# Patient Record
Sex: Female | Born: 1990 | Race: White | Hispanic: No | Marital: Single | Smoking: Current every day smoker
Health system: Southern US, Community
[De-identification: ages and names within clinical notes are randomized; demographics above are authoritative.]

## PROBLEM LIST (undated history)

## (undated) ENCOUNTER — Inpatient Hospital Stay (HOSPITAL_COMMUNITY): Payer: Self-pay

## (undated) DIAGNOSIS — R87629 Unspecified abnormal cytological findings in specimens from vagina: Secondary | ICD-10-CM

## (undated) DIAGNOSIS — L409 Psoriasis, unspecified: Secondary | ICD-10-CM

## (undated) DIAGNOSIS — B9689 Other specified bacterial agents as the cause of diseases classified elsewhere: Secondary | ICD-10-CM

## (undated) DIAGNOSIS — I509 Heart failure, unspecified: Secondary | ICD-10-CM

## (undated) DIAGNOSIS — F329 Major depressive disorder, single episode, unspecified: Secondary | ICD-10-CM

## (undated) DIAGNOSIS — A549 Gonococcal infection, unspecified: Secondary | ICD-10-CM

## (undated) DIAGNOSIS — I10 Essential (primary) hypertension: Secondary | ICD-10-CM

## (undated) DIAGNOSIS — F32A Depression, unspecified: Secondary | ICD-10-CM

## (undated) DIAGNOSIS — N390 Urinary tract infection, site not specified: Secondary | ICD-10-CM

## (undated) DIAGNOSIS — N760 Acute vaginitis: Secondary | ICD-10-CM

## (undated) HISTORY — PX: TOOTH EXTRACTION: SUR596

## (undated) HISTORY — PX: NO PAST SURGERIES: SHX2092

## (undated) HISTORY — DX: Heart failure, unspecified: I50.9

## (undated) HISTORY — DX: Depression, unspecified: F32.A

## (undated) HISTORY — DX: Major depressive disorder, single episode, unspecified: F32.9

---
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
## 2010-05-12 ENCOUNTER — Inpatient Hospital Stay (HOSPITAL_COMMUNITY)
Admission: AD | Admit: 2010-05-12 | Discharge: 2010-05-12 | Payer: Self-pay | Source: Home / Self Care | Admitting: Obstetrics and Gynecology

## 2010-05-12 ENCOUNTER — Ambulatory Visit: Payer: Self-pay | Admitting: Obstetrics and Gynecology

## 2010-10-01 ENCOUNTER — Ambulatory Visit (INDEPENDENT_AMBULATORY_CARE_PROVIDER_SITE_OTHER): Payer: Self-pay

## 2010-10-01 ENCOUNTER — Inpatient Hospital Stay (INDEPENDENT_AMBULATORY_CARE_PROVIDER_SITE_OTHER)
Admission: RE | Admit: 2010-10-01 | Discharge: 2010-10-01 | Disposition: A | Discharge status: Home / Self Care | Payer: Self-pay | Source: Ambulatory Visit

## 2010-10-01 DIAGNOSIS — IMO0002 Reserved for concepts with insufficient information to code with codable children: Secondary | ICD-10-CM

## 2010-10-01 DIAGNOSIS — Y33XXXA Other specified events, undetermined intent, initial encounter: Secondary | ICD-10-CM

## 2010-10-02 NOTE — Op Note (Signed)
NAMEJISELLE, Sharp                ACCOUNT NO.:  [id number]  MEDICAL RECORD NO.:  [id number]           PATIENT TYPE:  O  LOCATION:  URG                          FACILITY:  [facility]  PHYSICIAN:  Dionne Ano. Izabelle Daus, M.D.DATE OF BIRTH:  [birth year]  DATE OF PROCEDURE:  10/01/2010 DATE OF DISCHARGE:  10/01/2010                              OPERATIVE REPORT   I had the pleasure to see Lauren Sharp in the [facility] at the request of the emergency room physicians.  This patient is a 20 year old female who complains of pain in the right fifth finger after hitting the wall.  The patient is here with her mother.  She complains of mild- to-moderate pain with location over the right small finger.  She has mild deformity present.  I was asked to see and take over her care.  PAST MEDICAL HISTORY:  Psoriasis.  PAST SURGICAL HISTORY:  None noted.  MEDICATION:  Adderall.  ALLERGIES:  None.  REVIEW OF SYSTEMS:  She does not complain of any significant issues on review systems.  A 14-point review is reviewed.  FAMILY HISTORY:  Noncontributory and reviewed at length.  Her father is deceased.  Her mother is here with her.  SOCIAL HISTORY:  She smokes and drinks.  She is 20 years of age.  She completed ninth grade.  PHYSICAL EXAMINATION:  GENERAL:  She is a pleasant female, alert and oriented in no acute distress. VITAL SIGNS:  Stable. HEENT:  Within normal limits.  She has multiple piercing's throughout the nose, lips and ears.  The patient has normal HEENT examination, otherwise. CHEST:  Clear. ABDOMEN:  Nontender.  She is without chest pain, shortness of breath or abnormalities on lung and abdominal exam.  EXTREMITIES:  Lower extremity examination is benign.  Left upper extremity is neurovascularly intact. Normal alignment and stability and range of motion.  Right upper extremity has pain, swelling, ecchymosis and a mild deformity about the P1 region of her right small finger.   The remaining ring, middle, index and thumb digits are normal.  X-rays show displaced proximal phalanx fracture about the right small finger proximal phalanx.  IMPRESSION:  Closure of proximal phalanx fracture with displacement.  PLAN:  I have consented her for reduction and casting.  PROCEDURE NOTE:  She underwent a manipulative reduction followed by Coban taping and x-rays.  X-rays looked excellent and following the x- rays, I then placed her in a short-arm cast, well molded to my satisfaction.  I performed a casting myself with closed reduction myself and all looked quite well.  I asked her to use pain medicine according to our instructions.  She was given a pain medicine by the emergency room staff.  She will return to see me in a week for a 3-view x-rays and the cast, notify me should any problems occur.  These notes have been discussed.  All questions encouraged and answered.  She left the emergency room awake, alert and oriented without complicating feature.     Dionne Ano. Amanda Pea, M.D.     [facility]  D:  10/01/2010  T:  10/02/2010  Job:  [id number]  Electronically Signed by Dominica Severin M.D. on 10/02/2010 10:10:46 AM

## 2010-10-14 LAB — CBC
MCH: 27.8 pg (ref 26.0–34.0)
MCHC: 33.7 g/dL (ref 30.0–36.0)
Platelets: 255 10*3/uL (ref 150–400)

## 2010-10-14 LAB — GC/CHLAMYDIA PROBE AMP, GENITAL: GC Probe Amp, Genital: NEGATIVE

## 2010-10-14 LAB — WET PREP, GENITAL: Yeast Wet Prep HPF POC: NONE SEEN

## 2012-09-07 ENCOUNTER — Inpatient Hospital Stay (HOSPITAL_COMMUNITY)
Admission: AD | Admit: 2012-09-07 | Discharge: 2012-09-07 | Disposition: A | Discharge status: Home / Self Care | Payer: Self-pay | Source: Ambulatory Visit | Attending: Obstetrics & Gynecology | Admitting: Obstetrics & Gynecology

## 2012-09-07 ENCOUNTER — Encounter (HOSPITAL_COMMUNITY): Payer: Self-pay | Admitting: *Deleted

## 2012-09-07 DIAGNOSIS — N949 Unspecified condition associated with female genital organs and menstrual cycle: Secondary | ICD-10-CM | POA: Insufficient documentation

## 2012-09-07 DIAGNOSIS — N760 Acute vaginitis: Secondary | ICD-10-CM | POA: Insufficient documentation

## 2012-09-07 DIAGNOSIS — B9689 Other specified bacterial agents as the cause of diseases classified elsewhere: Secondary | ICD-10-CM | POA: Insufficient documentation

## 2012-09-07 DIAGNOSIS — A499 Bacterial infection, unspecified: Secondary | ICD-10-CM | POA: Insufficient documentation

## 2012-09-07 LAB — WET PREP, GENITAL
Trich, Wet Prep: NONE SEEN
Yeast Wet Prep HPF POC: NONE SEEN

## 2012-09-07 LAB — URINALYSIS, ROUTINE W REFLEX MICROSCOPIC
Glucose, UA: NEGATIVE mg/dL
Nitrite: NEGATIVE
Protein, ur: NEGATIVE mg/dL
pH: 6 (ref 5.0–8.0)

## 2012-09-07 LAB — URINE MICROSCOPIC-ADD ON

## 2012-09-07 LAB — POCT PREGNANCY, URINE: Preg Test, Ur: NEGATIVE

## 2012-09-07 MED ORDER — METRONIDAZOLE 500 MG PO TABS: 500.0000 mg | ORAL_TABLET | Freq: Three times a day (TID) | ORAL | Status: DC

## 2012-09-07 NOTE — MAU Provider Note (Signed)
CC: Vaginal Discharge    First Provider Initiated Contact with Patient 09/07/12 1504      HPI Lauren Sharp is a 22 y.o. [place]  who presents with onset 1 wk ago of malodorous fishy vaginal discharge, no change in usual appearance or amount. Similar sx when had BV in past. Went to [facility] today and was told she did not have a vaginal infection but she wants another opinion. They did GC/CT today. Denies having new or multiple sexual partners. Has Mirena and does not use condoms. Denies UTI sx.  Amenorrheic on Mirena. Denies pelvic pain or VB.   History reviewed. No pertinent past medical history.  OB History    Grav Para Term Preterm Abortions TAB SAB Ect Mult Living                  History reviewed. No pertinent past surgical history.  History   Social History  . Marital Status: Single    Spouse Name: N/A    Number of Children: N/A  . Years of Education: N/A   Occupational History  . Not on file.   Social History Main Topics  . Smoking status: Current Some Day Smoker  . Smokeless tobacco: Not on file  . Alcohol Use: No  . Drug Use: No  . Sexually Active: Yes    Birth Control/ Protection: IUD   Other Topics Concern  . Not on file   Social History Narrative  . No narrative on file    No current facility-administered medications on file prior to encounter.   No current outpatient prescriptions on file prior to encounter.    Allergies not on file  ROS Pertinent items in HPI  PHYSICAL EXAM Filed Vitals:   09/07/12 1333  BP: 138/81  Pulse: 71  Temp: 98.2 F (36.8 C)  Resp: 18   General: Well nourished, well developed female in no acute distress Cardiovascular: Normal rate Respiratory: Normal effort Abdomen: Soft, nontender Back: No CVAT Extremities: No edema Neurologic: Alert and oriented Speculum exam: NEFG; vagina with thin grey-white discharge, no obvious malodor or blood; cervix clean, Mirena strings in place Bimanual exam: cervix closed, no CMT;  uterus NSSP; no adnexal tenderness or masses  LAB RESULTS Results for orders placed during the hospital encounter of 09/07/12 (from the past 24 hour(s))  URINALYSIS, ROUTINE W REFLEX MICROSCOPIC     Status: Abnormal   Collection Time   09/07/12  1:35 PM      Component Value Range   Color, Urine YELLOW  YELLOW   APPearance CLEAR  CLEAR   Specific Gravity, Urine 1.015  1.005 - 1.030   pH 6.0  5.0 - 8.0   Glucose, UA NEGATIVE  NEGATIVE mg/dL   Hgb urine dipstick NEGATIVE  NEGATIVE   Bilirubin Urine NEGATIVE  NEGATIVE   Ketones, ur NEGATIVE  NEGATIVE mg/dL   Protein, ur NEGATIVE  NEGATIVE mg/dL   Urobilinogen, UA 0.2  0.0 - 1.0 mg/dL   Nitrite NEGATIVE  NEGATIVE   Leukocytes, UA MODERATE (*) NEGATIVE  URINE MICROSCOPIC-ADD ON     Status: Abnormal   Collection Time   09/07/12  1:35 PM      Component Value Range   Squamous Epithelial / LPF FEW (*) RARE   WBC, UA 7-10  <3 WBC/hpf   RBC / HPF 3-6  <3 RBC/hpf   Bacteria, UA FEW (*) RARE  POCT PREGNANCY, URINE     Status: Normal   Collection Time   09/07/12  1:44  PM      Component Value Range   Preg Test, Ur NEGATIVE  NEGATIVE    ASSESSMENT  1. BV (bacterial vaginosis)     PLAN Urine C&S sent per lab protocol, but she is not pregnant so won't need tx for ASB if present. Discharge home. See AVS for patient education.    Medication List    TAKE these medications       metroNIDAZOLE 500 MG tablet  Commonly known as:  FLAGYL  Take 1 tablet (500 mg total) by mouth 3 (three) times daily.          Danae Orleans, CNM 09/07/2012 3:12 PM

## 2012-09-07 NOTE — MAU Note (Signed)
Thinks has bacterial infection or something. Yellow d/c with odor.  Gets bacterial infections often.

## 2012-09-08 LAB — URINE CULTURE: Colony Count: 15000

## 2013-02-22 ENCOUNTER — Encounter (HOSPITAL_COMMUNITY): Payer: Self-pay | Admitting: *Deleted

## 2013-02-22 ENCOUNTER — Inpatient Hospital Stay (HOSPITAL_COMMUNITY)
Admission: AD | Admit: 2013-02-22 | Discharge: 2013-02-22 | Disposition: A | Discharge status: Home / Self Care | Payer: Self-pay | Source: Ambulatory Visit | Attending: Obstetrics & Gynecology | Admitting: Obstetrics & Gynecology

## 2013-02-22 DIAGNOSIS — N898 Other specified noninflammatory disorders of vagina: Secondary | ICD-10-CM

## 2013-02-22 DIAGNOSIS — N938 Other specified abnormal uterine and vaginal bleeding: Secondary | ICD-10-CM | POA: Insufficient documentation

## 2013-02-22 DIAGNOSIS — I10 Essential (primary) hypertension: Secondary | ICD-10-CM | POA: Insufficient documentation

## 2013-02-22 DIAGNOSIS — N949 Unspecified condition associated with female genital organs and menstrual cycle: Secondary | ICD-10-CM | POA: Insufficient documentation

## 2013-02-22 HISTORY — DX: Psoriasis, unspecified: L40.9

## 2013-02-22 HISTORY — DX: Other specified bacterial agents as the cause of diseases classified elsewhere: B96.89

## 2013-02-22 HISTORY — DX: Gonococcal infection, unspecified: A54.9

## 2013-02-22 HISTORY — DX: Acute vaginitis: N76.0

## 2013-02-22 LAB — URINALYSIS, ROUTINE W REFLEX MICROSCOPIC
Bilirubin Urine: NEGATIVE
Glucose, UA: NEGATIVE mg/dL
Ketones, ur: NEGATIVE mg/dL
Protein, ur: NEGATIVE mg/dL
pH: 7 (ref 5.0–8.0)

## 2013-02-22 LAB — URINE MICROSCOPIC-ADD ON

## 2013-02-22 LAB — WET PREP, GENITAL
Clue Cells Wet Prep HPF POC: NONE SEEN
Trich, Wet Prep: NONE SEEN

## 2013-02-22 NOTE — MAU Note (Signed)
[facility] ago and was given treatment for yeast. States she did not think that was what was wrong in the first place. Pill did not help. States she has a clear yellow D/C with slight odor. Called [facility] and was told to come in and they would retest her. Patient states she does not have confidence in their procedures. States she has had the mirena for 2 years and has not had any bleeding until now. Notices blood when she wipes. Not heavy.

## 2013-02-22 NOTE — MAU Provider Note (Signed)
History     CSN: [id number]  Arrival date and time: 02/22/13 1556   First Provider Initiated Contact with Patient 02/22/13 1625      Chief Complaint  Patient presents with  . Vaginal Bleeding  . Vaginal Discharge   HPI This is a 22 y.o. female who presents with c/o clear vaginal discharge with odor and spotting this week. Has had a Mirena IUD for 2 years. Just had STD cultures done at the [facility] and treated for yeast, but does not trust them. Wants to make sure. Some cramping at times, No fever.   RN Note: [facility] 1 [facility] 2 weeks ago and was given treatment for yeast. States she did not think that was what was wrong in the first place. Pill did not help. States she has a clear yellow D/C with slight odor. Called [facility] and was told to come in and they would retest her. Patient states she does not have confidence in their procedures. States she has had the mirena for 2 years and has not had any bleeding until now. Notices blood when she wipes. Not heavy.       OB History   Grav Para Term Preterm Abortions TAB SAB Ect Mult Living   0               Past Medical History  Diagnosis Date  . Gonorrhea   . Bacterial vaginosis     Past Surgical History  Procedure Laterality Date  . Tooth extraction      History reviewed. No pertinent family history.  History  Substance Use Topics  . Smoking status: Current Some Day Smoker -- 1.00 packs/day    Types: Cigarettes  . Smokeless tobacco: Never Used  . Alcohol Use: No     Comment: Rare    Allergies: Allergies not on file  Prescriptions prior to admission  Medication Sig Dispense Refill  . metroNIDAZOLE (FLAGYL) 500 MG tablet Take 1 tablet (500 mg total) by mouth 3 (three) times daily.  14 tablet  0    Review of Systems  Constitutional: Negative for fever, chills and malaise/fatigue.  Gastrointestinal: Positive for abdominal pain (intermittent cramping). Negative for nausea, vomiting, diarrhea and  constipation.  Neurological: Negative for headaches.   Physical Exam   Blood pressure 156/95, pulse 79, temperature 99 F (37.2 C), temperature source Oral, resp. rate 18, height 5\' 4"  (1.626 m), weight 87.998 kg (194 lb).  Physical Exam  Constitutional: She is oriented to person, place, and time. She appears well-developed and well-nourished. No distress.  HENT:  Head: Normocephalic.  Cardiovascular: Normal rate.   Respiratory: Effort normal.  GI: Soft. She exhibits no distension and no mass. There is no tenderness. There is no rebound and no guarding.  Genitourinary: Uterus normal. Vaginal discharge (clear discharge with red/brown streaks, IUD string in place) found.  Musculoskeletal: Normal range of motion.  Neurological: She is alert and oriented to person, place, and time.  Skin: Skin is warm and dry.  Psychiatric: She has a normal mood and affect.    MAU Course  Procedures  MDM Results for orders placed during the hospital encounter of 02/22/13 (from the past 24 hour(s))  URINALYSIS, ROUTINE W REFLEX MICROSCOPIC     Status: Abnormal   Collection Time    02/22/13  4:10 PM      Result Value Range   Color, Urine YELLOW  YELLOW   APPearance CLEAR  CLEAR   Specific Gravity, Urine  1.025  1.005 - 1.030   pH 7.0  5.0 - 8.0   Glucose, UA NEGATIVE  NEGATIVE mg/dL   Hgb urine dipstick LARGE (*) NEGATIVE   Bilirubin Urine NEGATIVE  NEGATIVE   Ketones, ur NEGATIVE  NEGATIVE mg/dL   Protein, ur NEGATIVE  NEGATIVE mg/dL   Urobilinogen, UA 1.0  0.0 - 1.0 mg/dL   Nitrite NEGATIVE  NEGATIVE   Leukocytes, UA TRACE (*) NEGATIVE  URINE MICROSCOPIC-ADD ON     Status: Abnormal   Collection Time    02/22/13  4:10 PM      Result Value Range   Squamous Epithelial / LPF RARE  RARE   WBC, UA 0-2  <3 WBC/hpf   RBC / HPF 0-2  <3 RBC/hpf   Bacteria, UA FEW (*) RARE  POCT PREGNANCY, URINE     Status: None   Collection Time    02/22/13  4:16 PM      Result Value Range   Preg Test, Ur  NEGATIVE  NEGATIVE  WET PREP, GENITAL     Status: Abnormal   Collection Time    02/22/13  4:34 PM      Result Value Range   Yeast Wet Prep HPF POC NONE SEEN  NONE SEEN   Trich, Wet Prep NONE SEEN  NONE SEEN   Clue Cells Wet Prep HPF POC NONE SEEN  NONE SEEN   WBC, Wet Prep HPF POC FEW (*) NONE SEEN     Assessment and Plan  A:  Vaginal discharge with no evidence of infection       Spotting with Mirena        Hypertensive today  P:  Discharge home       Reassured spotting and intermittent discharge is normal        Referred to [facility] for BP evaluation     Penny Arrambide 02/22/2013, 4:25 PM

## 2013-06-16 ENCOUNTER — Encounter (HOSPITAL_COMMUNITY): Payer: Self-pay | Admitting: *Deleted

## 2013-06-16 ENCOUNTER — Inpatient Hospital Stay (HOSPITAL_COMMUNITY)
Admission: AD | Admit: 2013-06-16 | Discharge: 2013-06-16 | Disposition: A | Discharge status: Home / Self Care | Payer: Medicaid Other | Source: Ambulatory Visit | Attending: Obstetrics & Gynecology | Admitting: Obstetrics & Gynecology

## 2013-06-16 DIAGNOSIS — F17200 Nicotine dependence, unspecified, uncomplicated: Secondary | ICD-10-CM | POA: Insufficient documentation

## 2013-06-16 DIAGNOSIS — N949 Unspecified condition associated with female genital organs and menstrual cycle: Secondary | ICD-10-CM | POA: Insufficient documentation

## 2013-06-16 DIAGNOSIS — N760 Acute vaginitis: Secondary | ICD-10-CM

## 2013-06-16 DIAGNOSIS — B9689 Other specified bacterial agents as the cause of diseases classified elsewhere: Secondary | ICD-10-CM | POA: Insufficient documentation

## 2013-06-16 DIAGNOSIS — A499 Bacterial infection, unspecified: Secondary | ICD-10-CM

## 2013-06-16 LAB — URINALYSIS, ROUTINE W REFLEX MICROSCOPIC
Bilirubin Urine: NEGATIVE
Glucose, UA: NEGATIVE mg/dL
Hgb urine dipstick: NEGATIVE
Protein, ur: NEGATIVE mg/dL
Urobilinogen, UA: 1 mg/dL (ref 0.0–1.0)

## 2013-06-16 LAB — WET PREP, GENITAL
Trich, Wet Prep: NONE SEEN
Yeast Wet Prep HPF POC: NONE SEEN

## 2013-06-16 LAB — URINE MICROSCOPIC-ADD ON

## 2013-06-16 MED ORDER — METRONIDAZOLE 500 MG PO TABS: 500.0000 mg | ORAL_TABLET | Freq: Two times a day (BID) | ORAL | Status: DC

## 2013-06-16 NOTE — MAU Provider Note (Signed)
History     CSN: [id number]  Arrival date and time: 06/16/13 1618   First Provider Initiated Contact with Patient 06/16/13 1712      Chief Complaint  Patient presents with  . Vaginal Discharge   HPI Lauren Sharp is a 22 y.o. G0P0. She has Mirena for contraception, last spotting was October. She c/o thin yellow vaginal discharge with strong odor. Has frequent BV, keeps coming back. No UTI S&S or GI changes. Same partner x 1 yr, condoms occ. Neg STD screen last month at [facility].     Past Medical History  Diagnosis Date  . Gonorrhea   . Bacterial vaginosis   . Psoriasis     Past Surgical History  Procedure Laterality Date  . Tooth extraction      History reviewed. No pertinent family history.  History  Substance Use Topics  . Smoking status: Current Some Day Smoker -- 1.00 packs/day    Types: Cigarettes  . Smokeless tobacco: Never Used  . Alcohol Use: No     Comment: Rare    Allergies: No Known Allergies  No prescriptions prior to admission    Review of Systems  Constitutional: Negative for fever and chills.  Gastrointestinal: Negative for nausea, vomiting, abdominal pain, diarrhea and constipation.  Genitourinary: Negative for dysuria, urgency and frequency.   Physical Exam   Blood pressure 137/97, pulse 99, temperature 98.2 F (36.8 C), resp. rate 18, height 5\' 5"  (1.651 m), weight 190 lb (86.183 kg).  Physical Exam  Vitals reviewed. Constitutional: She is oriented to person, place, and time. She appears well-developed and well-nourished.  GI: Soft. There is no tenderness.  Genitourinary:  Pelvic exam: Ext genitalia- nl anatomy,skin intact Vagina- small amt thin white discharge with odor Cx- closed, IUD strings seen Uterus- nl size, non tender Adn- no masses palp, non tender  Neurological: She is alert and oriented to person, place, and time.  Skin: Skin is warm and dry.  Psychiatric: She has a normal mood and affect. Her behavior is normal.    MAU  Course  Procedures  MDM Results for orders placed during the hospital encounter of 06/16/13 (from the past 24 hour(s))  URINALYSIS, ROUTINE W REFLEX MICROSCOPIC     Status: Abnormal   Collection Time    06/16/13  4:55 PM      Result Value Range   Color, Urine YELLOW  YELLOW   APPearance CLEAR  CLEAR   Specific Gravity, Urine 1.025  1.005 - 1.030   pH 7.0  5.0 - 8.0   Glucose, UA NEGATIVE  NEGATIVE mg/dL   Hgb urine dipstick NEGATIVE  NEGATIVE   Bilirubin Urine NEGATIVE  NEGATIVE   Ketones, ur NEGATIVE  NEGATIVE mg/dL   Protein, ur NEGATIVE  NEGATIVE mg/dL   Urobilinogen, UA 1.0  0.0 - 1.0 mg/dL   Nitrite NEGATIVE  NEGATIVE   Leukocytes, UA SMALL (*) NEGATIVE  URINE MICROSCOPIC-ADD ON     Status: Abnormal   Collection Time    06/16/13  4:55 PM      Result Value Range   Squamous Epithelial / LPF FEW (*) RARE   WBC, UA 0-2  <3 WBC/hpf   Bacteria, UA RARE  RARE   Urine-Other MUCOUS PRESENT    WET PREP, GENITAL     Status: Abnormal   Collection Time    06/16/13  5:20 PM      Result Value Range   Yeast Wet Prep HPF POC NONE SEEN  NONE SEEN   Trich,  Wet Prep NONE SEEN  NONE SEEN   Clue Cells Wet Prep HPF POC MODERATE (*) NONE SEEN   WBC, Wet Prep HPF POC MODERATE (*) NONE SEEN     Assessment and Plan  ASSESSMENT:  Bacterial vaginosis,persistent  PLAN:  Flagyl BID x 14 d Other management options reviewed F/u [facility] as needed  Delores Thelen M. 06/16/2013, 5:34 PM

## 2013-06-16 NOTE — MAU Note (Signed)
Pt presents with complaints of a yellowish vaginal discharge with an odor that started about 2 to 3 days ago. She says she has a history of BV and has been treated multiple times

## 2013-06-17 LAB — GC/CHLAMYDIA PROBE AMP
CT Probe RNA: NEGATIVE
GC Probe RNA: NEGATIVE

## 2013-12-09 ENCOUNTER — Inpatient Hospital Stay (HOSPITAL_COMMUNITY)
Admission: AD | Admit: 2013-12-09 | Discharge: 2013-12-09 | Disposition: A | Discharge status: Home / Self Care | Payer: Self-pay | Source: Ambulatory Visit | Attending: Family Medicine | Admitting: Family Medicine

## 2013-12-09 ENCOUNTER — Encounter (HOSPITAL_COMMUNITY): Payer: Self-pay | Admitting: *Deleted

## 2013-12-09 DIAGNOSIS — F17200 Nicotine dependence, unspecified, uncomplicated: Secondary | ICD-10-CM | POA: Insufficient documentation

## 2013-12-09 DIAGNOSIS — IMO0001 Reserved for inherently not codable concepts without codable children: Secondary | ICD-10-CM

## 2013-12-09 DIAGNOSIS — R109 Unspecified abdominal pain: Secondary | ICD-10-CM | POA: Insufficient documentation

## 2013-12-09 DIAGNOSIS — R030 Elevated blood-pressure reading, without diagnosis of hypertension: Secondary | ICD-10-CM | POA: Insufficient documentation

## 2013-12-09 DIAGNOSIS — N390 Urinary tract infection, site not specified: Secondary | ICD-10-CM | POA: Insufficient documentation

## 2013-12-09 HISTORY — DX: Essential (primary) hypertension: I10

## 2013-12-09 HISTORY — DX: Unspecified abnormal cytological findings in specimens from vagina: R87.629

## 2013-12-09 LAB — URINALYSIS, ROUTINE W REFLEX MICROSCOPIC
BILIRUBIN URINE: NEGATIVE
Glucose, UA: NEGATIVE mg/dL
HGB URINE DIPSTICK: NEGATIVE
KETONES UR: NEGATIVE mg/dL
NITRITE: NEGATIVE
PH: 6 (ref 5.0–8.0)
Protein, ur: NEGATIVE mg/dL
Specific Gravity, Urine: >1.03 — ABNORMAL HIGH (ref 1.005–1.030)
Urobilinogen, UA: 0.2 mg/dL (ref 0.0–1.0)

## 2013-12-09 LAB — URINE MICROSCOPIC-ADD ON

## 2013-12-09 LAB — WET PREP, GENITAL
CLUE CELLS WET PREP: NONE SEEN
TRICH WET PREP: NONE SEEN
YEAST WET PREP: NONE SEEN

## 2013-12-09 LAB — POCT PREGNANCY, URINE: PREG TEST UR: NEGATIVE

## 2013-12-09 MED ORDER — CIPROFLOXACIN HCL 500 MG PO TABS: 500.0000 mg | ORAL_TABLET | Freq: Two times a day (BID) | ORAL | Status: DC

## 2013-12-09 NOTE — Discharge Instructions (Signed)
Urinary Tract Infection Urinary tract infections (UTIs) can develop anywhere along your urinary tract. Your urinary tract is your body's drainage system for removing wastes and extra water. Your urinary tract includes two kidneys, two ureters, a bladder, and a urethra. Your kidneys are a pair of bean-shaped organs. Each kidney is about the size of your fist. They are located below your ribs, one on each side of your spine. CAUSES Infections are caused by microbes, which are microscopic organisms, including fungi, viruses, and bacteria. These organisms are so small that they can only be seen through a microscope. Bacteria are the microbes that most commonly cause UTIs. SYMPTOMS  Symptoms of UTIs may vary by age and gender of the patient and by the location of the infection. Symptoms in young women typically include a frequent and intense urge to urinate and a painful, burning feeling in the bladder or urethra during urination. Older women and men are more likely to be tired, shaky, and weak and have muscle aches and abdominal pain. A fever may mean the infection is in your kidneys. Other symptoms of a kidney infection include pain in your back or sides below the ribs, nausea, and vomiting. DIAGNOSIS To diagnose a UTI, your caregiver will ask you about your symptoms. Your caregiver also will ask to provide a urine sample. The urine sample will be tested for bacteria and white blood cells. White blood cells are made by your body to help fight infection. TREATMENT  Typically, UTIs can be treated with medication. Because most UTIs are caused by a bacterial infection, they usually can be treated with the use of antibiotics. The choice of antibiotic and length of treatment depend on your symptoms and the type of bacteria causing your infection. HOME CARE INSTRUCTIONS  If you were prescribed antibiotics, take them exactly as your caregiver instructs you. Finish the medication even if you feel better after you  have only taken some of the medication.  Drink enough water and fluids to keep your urine clear or pale yellow.  Avoid caffeine, tea, and carbonated beverages. They tend to irritate your bladder.  Empty your bladder often. Avoid holding urine for long periods of time.  Empty your bladder before and after sexual intercourse.  After a bowel movement, women should cleanse from front to back. Use each tissue only once. SEEK MEDICAL CARE IF:   You have back pain.  You develop a fever.  Your symptoms do not begin to resolve within 3 days. SEEK IMMEDIATE MEDICAL CARE IF:   You have severe back pain or lower abdominal pain.  You develop chills.  You have nausea or vomiting.  You have continued burning or discomfort with urination. MAKE SURE YOU:   Understand these instructions.  Will watch your condition.  Will get help right away if you are not doing well or get worse. Document Released: 04/27/2005 Document Revised: 01/17/2012 Document Reviewed: 08/26/2011 [facility] Patient Information 2014 [place], [place]. Contraception Choices Contraception (birth control) is the use of any methods or devices to prevent pregnancy. Below are some methods to help avoid pregnancy. HORMONAL METHODS   Contraceptive implant This is a thin, plastic tube containing progesterone hormone. It does not contain estrogen hormone. Your health care provider inserts the tube in the inner part of the upper arm. The tube can remain in place for up to 3 years. After 3 years, the implant must be removed. The implant prevents the ovaries from releasing an egg (ovulation), thickens the cervical mucus to prevent sperm  from entering the uterus, and thins the lining of the inside of the uterus.  Progesterone-only injections These injections are given every 3 months by your health care provider to prevent pregnancy. This synthetic progesterone hormone stops the ovaries from releasing eggs. It also thickens cervical mucus  and changes the uterine lining. This makes it harder for sperm to survive in the uterus.  Birth control pills These pills contain estrogen and progesterone hormone. They work by preventing the ovaries from releasing eggs (ovulation). They also cause the cervical mucus to thicken, preventing the sperm from entering the uterus. Birth control pills are prescribed by a health care provider.Birth control pills can also be used to treat heavy periods.  Minipill This type of birth control pill contains only the progesterone hormone. They are taken every day of each month and must be prescribed by your health care provider.  Birth control patch The patch contains hormones similar to those in birth control pills. It must be changed once a week and is prescribed by a health care provider.  Vaginal ring The ring contains hormones similar to those in birth control pills. It is left in the vagina for 3 weeks, removed for 1 week, and then a new one is put back in place. The patient must be comfortable inserting and removing the ring from the vagina.A health care provider's prescription is necessary.  Emergency contraception Emergency contraceptives prevent pregnancy after unprotected sexual intercourse. This pill can be taken right after sex or up to 5 days after unprotected sex. It is most effective the sooner you take the pills after having sexual intercourse. Most emergency contraceptive pills are available without a prescription. Check with your pharmacist. Do not use emergency contraception as your only form of birth control. BARRIER METHODS   Female condom This is a thin sheath (latex or rubber) that is worn over the penis during sexual intercourse. It can be used with spermicide to increase effectiveness.  Female condom. This is a soft, loose-fitting sheath that is put into the vagina before sexual intercourse.  Diaphragm This is a soft, latex, dome-shaped barrier that must be fitted by a health care  provider. It is inserted into the vagina, along with a spermicidal jelly. It is inserted before intercourse. The diaphragm should be left in the vagina for 6 to 8 hours after intercourse.  Cervical cap This is a round, soft, latex or plastic cup that fits over the cervix and must be fitted by a health care provider. The cap can be left in place for up to 48 hours after intercourse.  Sponge This is a soft, circular piece of polyurethane foam. The sponge has spermicide in it. It is inserted into the vagina after wetting it and before sexual intercourse.  Spermicides These are chemicals that kill or block sperm from entering the cervix and uterus. They come in the form of creams, jellies, suppositories, foam, or tablets. They do not require a prescription. They are inserted into the vagina with an applicator before having sexual intercourse. The process must be repeated every time you have sexual intercourse. INTRAUTERINE CONTRACEPTION  Intrauterine device (IUD) This is a T-shaped device that is put in a woman's uterus during a menstrual period to prevent pregnancy. There are 2 types:  Copper IUD This type of IUD is wrapped in copper wire and is placed inside the uterus. Copper makes the uterus and fallopian tubes produce a fluid that kills sperm. It can stay in place for 10 years.  Hormone  IUD This type of IUD contains the hormone progestin (synthetic progesterone). The hormone thickens the cervical mucus and prevents sperm from entering the uterus, and it also thins the uterine lining to prevent implantation of a fertilized egg. The hormone can weaken or kill the sperm that get into the uterus. It can stay in place for 3 5 years, depending on which type of IUD is used. PERMANENT METHODS OF CONTRACEPTION  Female tubal ligation This is when the woman's fallopian tubes are surgically sealed, tied, or blocked to prevent the egg from traveling to the uterus.  Hysteroscopic sterilization This involves  placing a small coil or insert into each fallopian tube. Your doctor uses a technique called hysteroscopy to do the procedure. The device causes scar tissue to form. This results in permanent blockage of the fallopian tubes, so the sperm cannot fertilize the egg. It takes about 3 months after the procedure for the tubes to become blocked. You must use another form of birth control for these 3 months.  Female sterilization This is when the female has the tubes that carry sperm tied off (vasectomy).This blocks sperm from entering the vagina during sexual intercourse. After the procedure, the man can still ejaculate fluid (semen). NATURAL PLANNING METHODS  Natural family planning This is not having sexual intercourse or using a barrier method (condom, diaphragm, cervical cap) on days the woman could become pregnant.  Calendar method This is keeping track of the length of each menstrual cycle and identifying when you are fertile.  Ovulation method This is avoiding sexual intercourse during ovulation.  Symptothermal method This is avoiding sexual intercourse during ovulation, using a thermometer and ovulation symptoms.  Post ovulation method This is timing sexual intercourse after you have ovulated. Regardless of which type or method of contraception you choose, it is important that you use condoms to protect against the transmission of sexually transmitted infections (STIs). Talk with your health care provider about which form of contraception is most appropriate for you. Document Released: 07/18/2005 Document Revised: 03/20/2013 Document Reviewed: 01/10/2013 [facility] Patient Information 2014 [place], [place].

## 2013-12-09 NOTE — MAU Provider Note (Signed)
CC: Vaginal Discharge, Abdominal Pain and Possible Pregnancy    First Provider Initiated Contact with Patient 12/09/13 1740      HPI Lauren Sharp is a 23 y.o. nulligravida who presents with onset of malodorous yellowish vaginal discharge five days ago. Has associated dysparunia. Has suprapubic pressure and urinary urgency/frequency but denies dysuria. Marland Kitchen.LMP 11/09/2013. Took plan B pills 2 wks ago and had bleeding for a few days. Then seen at [facility] for vaginal discharge similar to what she now describes. and treated for yeast.   ROS Pertinent items in HPI  Past Medical History  Diagnosis Date  . Gonorrhea   . Bacterial vaginosis   . Psoriasis   . Vaginal Pap smear, abnormal   . Hypertension   States she's been told here and at HD that BP elevated, never dx or tx as HTN  OB History  Gravida Para Term Preterm AB SAB TAB Ectopic Multiple Living  0                 Past Surgical History  Procedure Laterality Date  . Tooth extraction      History   Social History  . Marital Status: Single    Spouse Name: N/A    Number of Children: N/A  . Years of Education: N/A   Occupational History  . Not on file.   Social History Main Topics  . Smoking status: Current Some Day Smoker -- 1.00 packs/day for 10 years    Types: Cigarettes  . Smokeless tobacco: Never Used  . Alcohol Use: No     Comment: Rare  . Drug Use: No  . Sexual Activity: Yes    Birth Control/ Protection: None     Comment: last sex three days ago   Other Topics Concern  . Not on file   Social History Narrative  . No narrative on file    No current facility-administered medications on file prior to encounter.   Current Outpatient Prescriptions on File Prior to Encounter  Medication Sig Dispense Refill  . metroNIDAZOLE (FLAGYL) 500 MG tablet Take 1 tablet (500 mg total) by mouth 2 (two) times daily.  28 tablet  0    No Known Allergies   PHYSICAL EXAM Filed Vitals:   12/09/13 1630  BP: 151/94  Pulse: 77   Temp: 98.4 F (36.9 C)  Resp: 16   General: Well nourished, well developed female in no acute distress Cardiovascular: Normal rate Respiratory: Normal effort Abdomen: Soft, mildly tender over bladdertender Back: No CVAT Extremities: No edema Neurologic: Alert and oriented Speculum exam: NEFG; vagina with physiologic discharge, no blood; cervix clean Bimanual exam: cervix closed, no CMT; uterus NSSP; no adnexal tenderness or masses   LAB RESULTS Results for orders placed during the hospital encounter of 12/09/13 (from the past 24 hour(s))  URINALYSIS, ROUTINE W REFLEX MICROSCOPIC     Status: Abnormal   Collection Time    12/09/13  4:35 PM      Result Value Ref Range   Color, Urine YELLOW  YELLOW   APPearance HAZY (*) CLEAR   Specific Gravity, Urine >1.030 (*) 1.005 - 1.030   pH 6.0  5.0 - 8.0   Glucose, UA NEGATIVE  NEGATIVE mg/dL   Hgb urine dipstick NEGATIVE  NEGATIVE   Bilirubin Urine NEGATIVE  NEGATIVE   Ketones, ur NEGATIVE  NEGATIVE mg/dL   Protein, ur NEGATIVE  NEGATIVE mg/dL   Urobilinogen, UA 0.2  0.0 - 1.0 mg/dL   Nitrite NEGATIVE  NEGATIVE  Leukocytes, UA MODERATE (*) NEGATIVE  URINE MICROSCOPIC-ADD ON     Status: Abnormal   Collection Time    12/09/13  4:35 PM      Result Value Ref Range   Squamous Epithelial / LPF MANY (*) RARE   WBC, UA 11-20  <3 WBC/hpf   Bacteria, UA FEW (*) RARE  POCT PREGNANCY, URINE     Status: None   Collection Time    12/09/13  4:42 PM      Result Value Ref Range   Preg Test, Ur NEGATIVE  NEGATIVE  WET PREP, GENITAL     Status: Abnormal   Collection Time    12/09/13  5:57 PM      Result Value Ref Range   Yeast Wet Prep HPF POC NONE SEEN  NONE SEEN   Trich, Wet Prep NONE SEEN  NONE SEEN   Clue Cells Wet Prep HPF POC NONE SEEN  NONE SEEN   WBC, Wet Prep HPF POC MODERATE (*) NONE SEEN    IMAGING No results found.  MAU COURSE GC, CT sent  ASSESSMENT  1. Elevated blood pressure   2. UTI (lower urinary tract infection)      PLAN Discharge home. See AVS for patient education. See PCP for BP evaluation Contraceptive choices information   Medication List         ciprofloxacin 500 MG tablet  Commonly known as:  CIPRO  Take 1 tablet (500 mg total) by mouth 2 (two) times daily.     ibuprofen 200 MG tablet  Commonly known as:  ADVIL,MOTRIN  Take 400 mg by mouth every 6 (six) hours as needed for cramping.         folliculitis  Danae OrleansDeirdre C Aero Drummonds, CNM 12/09/2013 5:42 PM

## 2013-12-09 NOTE — MAU Note (Signed)
Patient states she has recurrent yeast infections. States she has been having a vaginal discharge with an odor and has pain with intercourse. Denies bleeding, nausea or vomiting.

## 2013-12-09 NOTE — MAU Provider Note (Signed)
Attestation of Attending Supervision of Advanced Practitioner (PA/CNM/NP): Evaluation and management procedures were performed by the Advanced Practitioner under my supervision and collaboration.  I have reviewed the Advanced Practitioner's note and chart, and I agree with the management and plan.  Reva Boresanya S Makiah Foye, MD [facility] Attending 12/09/2013 6:19 PM

## 2013-12-10 LAB — GC/CHLAMYDIA PROBE AMP
CT Probe RNA: NEGATIVE
GC PROBE AMP APTIMA: NEGATIVE

## 2014-02-08 ENCOUNTER — Encounter (HOSPITAL_COMMUNITY): Payer: Self-pay | Admitting: General Practice

## 2014-02-08 ENCOUNTER — Inpatient Hospital Stay (HOSPITAL_COMMUNITY)
Admission: AD | Admit: 2014-02-08 | Discharge: 2014-02-08 | Disposition: A | Discharge status: Home / Self Care | Payer: Medicaid Other | Source: Ambulatory Visit | Attending: Obstetrics and Gynecology | Admitting: Obstetrics and Gynecology

## 2014-02-08 DIAGNOSIS — N946 Dysmenorrhea, unspecified: Secondary | ICD-10-CM | POA: Insufficient documentation

## 2014-02-08 DIAGNOSIS — L408 Other psoriasis: Secondary | ICD-10-CM | POA: Insufficient documentation

## 2014-02-08 DIAGNOSIS — I10 Essential (primary) hypertension: Secondary | ICD-10-CM | POA: Insufficient documentation

## 2014-02-08 DIAGNOSIS — F17200 Nicotine dependence, unspecified, uncomplicated: Secondary | ICD-10-CM | POA: Insufficient documentation

## 2014-02-08 DIAGNOSIS — R109 Unspecified abdominal pain: Secondary | ICD-10-CM | POA: Insufficient documentation

## 2014-02-08 LAB — URINALYSIS, ROUTINE W REFLEX MICROSCOPIC
BILIRUBIN URINE: NEGATIVE
GLUCOSE, UA: NEGATIVE mg/dL
KETONES UR: NEGATIVE mg/dL
Nitrite: POSITIVE — AB
Protein, ur: 100 mg/dL — AB
SPECIFIC GRAVITY, URINE: 1.015 (ref 1.005–1.030)
Urobilinogen, UA: 1 mg/dL (ref 0.0–1.0)
pH: 7 (ref 5.0–8.0)

## 2014-02-08 LAB — CBC
HEMATOCRIT: 44.5 % (ref 36.0–46.0)
Hemoglobin: 15.9 g/dL — ABNORMAL HIGH (ref 12.0–15.0)
MCH: 30.7 pg (ref 26.0–34.0)
MCHC: 35.7 g/dL (ref 30.0–36.0)
MCV: 85.9 fL (ref 78.0–100.0)
Platelets: 229 10*3/uL (ref 150–400)
RBC: 5.18 MIL/uL — AB (ref 3.87–5.11)
RDW: 12.7 % (ref 11.5–15.5)
WBC: 10.5 10*3/uL (ref 4.0–10.5)

## 2014-02-08 LAB — URINE MICROSCOPIC-ADD ON

## 2014-02-08 LAB — POCT PREGNANCY, URINE: Preg Test, Ur: NEGATIVE

## 2014-02-08 MED ORDER — KETOROLAC TROMETHAMINE 60 MG/2ML IM SOLN
60.0000 mg | Freq: Once | INTRAMUSCULAR | Status: AC
  Administered 2014-02-08: 60 mg via INTRAMUSCULAR
  Filled 2014-02-08: qty 2

## 2014-02-08 NOTE — MAU Provider Note (Signed)
History     CSN: [id number]  Arrival date and time: 02/08/14 1840   First Provider Initiated Contact with Patient 02/08/14 1924         Chief Complaint  Patient presents with  . Vaginal Bleeding   HPI  Lauren Sharp is a 23 y.o. female who presents for heavy vaginal bleeding. Period started yesterday; heavier than normal. Uses overnight pads & changes them hourly; not saturated but states blood does cover most of pad when she changes it. Lower abdominal pain started today; sharp & constant. Rates as 7/10; took midol with no relief. Lying flat aggravates pain.  Had Mirena placed 3 years ago for heavy periods; mirena removed 5-6 months ago d/t frequent vaginal infections. Currently no birth control. States has had periods monthly since removal. Periods heavy & last 7 days; states this period was different b/c it started "heavier" than normal.  Denies n/v/d/constipation. Denies urinary symptoms. Denies vaginal discharge. Denies dyspareunia. Has 1 female partner, has been with him for 2 years.   Past Medical History  Diagnosis Date  . Gonorrhea   . Bacterial vaginosis   . Psoriasis   . Vaginal Pap smear, abnormal   . Hypertension     Past Surgical History  Procedure Laterality Date  . Tooth extraction      History reviewed. No pertinent family history.  History  Substance Use Topics  . Smoking status: Current Some Day Smoker -- 1.00 packs/day for 10 years    Types: Cigarettes  . Smokeless tobacco: Never Used  . Alcohol Use: No     Comment: Rare    Allergies: No Known Allergies  Prescriptions prior to admission  Medication Sig Dispense Refill  . Acetaminophen-Caff-Pyrilamine (MIDOL COMPLETE) 500-60-15 MG TABS Take 2 tablets by mouth daily as needed (cramps).      Marland Kitchen. ibuprofen (ADVIL,MOTRIN) 200 MG tablet Take 400 mg by mouth every 6 (six) hours as needed for cramping.        Review of Systems  Respiratory: Negative.   Cardiovascular: Negative.   Gastrointestinal:  Positive for abdominal pain. Negative for nausea, vomiting, diarrhea and constipation.  Genitourinary: Negative for dysuria, urgency and frequency.       Positive for vaginal bleeding.   Neurological: Positive for weakness (has felt "drained" for several months). Negative for dizziness and tingling.  Endo/Heme/Allergies: Does not bruise/bleed easily.   Physical Exam   Blood pressure 134/88, pulse 100, temperature 98 F (36.7 C), temperature source Oral, resp. rate 18, height 5\' 4"  (1.626 m), weight 184 lb 6 oz (83.632 kg), last menstrual period 02/07/2014, SpO2 100.00%.  Orthostatic Blood Pressure: Blood pressure:   lying 140/77, sitting 138/89, standing 134/88 Pulse:   lying 84, sitting 80, standing 100    Physical Exam  Constitutional: She is oriented to person, place, and time. She appears well-developed and well-nourished.  Cardiovascular: Normal rate, regular rhythm and normal heart sounds.   Respiratory: Effort normal and breath sounds normal. No respiratory distress.  GI: Soft. Bowel sounds are normal. She exhibits no distension and no mass. There is tenderness (lower abdomen). There is no rebound and no guarding.  Neurological: She is alert and oriented to person, place, and time.  Skin: Skin is warm and dry. She is not diaphoretic.    MAU Course  Procedures Results for orders placed during the hospital encounter of 02/08/14 (from the past 24 hour(s))  URINALYSIS, ROUTINE W REFLEX MICROSCOPIC     Status: Abnormal   Collection Time  02/08/14  6:56 PM      Result Value Ref Range   Color, Urine ORANGE (*) YELLOW   APPearance CLOUDY (*) CLEAR   Specific Gravity, Urine 1.015  1.005 - 1.030   pH 7.0  5.0 - 8.0   Glucose, UA NEGATIVE  NEGATIVE mg/dL   Hgb urine dipstick LARGE (*) NEGATIVE   Bilirubin Urine NEGATIVE  NEGATIVE   Ketones, ur NEGATIVE  NEGATIVE mg/dL   Protein, ur 161 (*) NEGATIVE mg/dL   Urobilinogen, UA 1.0  0.0 - 1.0 mg/dL   Nitrite POSITIVE (*)  NEGATIVE   Leukocytes, UA TRACE (*) NEGATIVE  URINE MICROSCOPIC-ADD ON     Status: Abnormal   Collection Time    02/08/14  6:56 PM      Result Value Ref Range   Squamous Epithelial / LPF FEW (*) RARE   WBC, UA 3-6  <3 WBC/hpf   RBC / HPF TOO NUMEROUS TO COUNT  <3 RBC/hpf   Bacteria, UA FEW (*) RARE   Urine-Other MUCOUS PRESENT    CBC     Status: Abnormal   Collection Time    02/08/14  7:20 PM      Result Value Ref Range   WBC 10.5  4.0 - 10.5 K/uL   RBC 5.18 (*) 3.87 - 5.11 MIL/uL   Hemoglobin 15.9 (*) 12.0 - 15.0 g/dL   HCT 09.6  04.5 - 40.9 %   MCV 85.9  78.0 - 100.0 fL   MCH 30.7  26.0 - 34.0 pg   MCHC 35.7  30.0 - 36.0 g/dL   RDW 81.1  91.4 - 78.2 %   Platelets 229  150 - 400 K/uL  POCT PREGNANCY, URINE     Status: None   Collection Time    02/08/14  7:30 PM      Result Value Ref Range   Preg Test, Ur NEGATIVE  NEGATIVE    MDM Pt given toradol. Client does not feel like she has a UTI today.  Has had a UTI in the past.  Likely her urine is contaminated with heavy menses today.  Assessment and Plan  A:Dysmenorrhea and heavy bleeding  P: Ibuprofen 600 mg PO with food every 6 hours for 2 days and then PRN to control her pain. There is no anemia. [facility]  Currie Paris, NP  02/08/2014, 8:20 PM   Care assumed by Nolene Bernheim FNP at 8:20 PM I supervised the student and agree with the exam and documentation.

## 2014-02-08 NOTE — Discharge Instructions (Signed)
Change your medicine to control the cramps.   Take Ibuprofen 600 mg PO with food every 6 hours for 2 days and then PRN to control pain. There is no anemia. [facility].

## 2014-02-08 NOTE — MAU Note (Signed)
Pt states here for heavy vaginal bleeding that began yesterday. Changing pad/tampon at least every hour since cycle came on. Starting to feel weak, low energy. Hx anemia. Lower abd cramping down into legs that has been constant today.

## 2014-02-10 NOTE — MAU Provider Note (Signed)
Attestation of Attending Supervision of Advanced Practitioner: Evaluation and management procedures were performed by the PA/NP/CNM/OB Fellow under my supervision/collaboration. Chart reviewed and agree with management and plan.  Mekaila Tarnow V 02/10/2014 12:07 AM

## 2014-04-07 ENCOUNTER — Inpatient Hospital Stay (HOSPITAL_COMMUNITY): Payer: Medicaid Other

## 2014-04-07 ENCOUNTER — Inpatient Hospital Stay (HOSPITAL_COMMUNITY)
Admission: AD | Admit: 2014-04-07 | Discharge: 2014-04-07 | Disposition: A | Discharge status: Home / Self Care | Payer: Medicaid Other | Source: Ambulatory Visit | Attending: Family Medicine | Admitting: Family Medicine

## 2014-04-07 ENCOUNTER — Encounter (HOSPITAL_COMMUNITY): Payer: Self-pay

## 2014-04-07 DIAGNOSIS — R109 Unspecified abdominal pain: Secondary | ICD-10-CM | POA: Diagnosis present

## 2014-04-07 DIAGNOSIS — O10019 Pre-existing essential hypertension complicating pregnancy, unspecified trimester: Secondary | ICD-10-CM | POA: Insufficient documentation

## 2014-04-07 DIAGNOSIS — O99330 Smoking (tobacco) complicating pregnancy, unspecified trimester: Secondary | ICD-10-CM | POA: Diagnosis not present

## 2014-04-07 DIAGNOSIS — O99891 Other specified diseases and conditions complicating pregnancy: Secondary | ICD-10-CM | POA: Insufficient documentation

## 2014-04-07 DIAGNOSIS — O9989 Other specified diseases and conditions complicating pregnancy, childbirth and the puerperium: Secondary | ICD-10-CM

## 2014-04-07 DIAGNOSIS — O26899 Other specified pregnancy related conditions, unspecified trimester: Secondary | ICD-10-CM

## 2014-04-07 LAB — URINALYSIS, ROUTINE W REFLEX MICROSCOPIC
Bilirubin Urine: NEGATIVE
Glucose, UA: NEGATIVE mg/dL
Hgb urine dipstick: NEGATIVE
Ketones, ur: NEGATIVE mg/dL
Nitrite: NEGATIVE
PROTEIN: NEGATIVE mg/dL
SPECIFIC GRAVITY, URINE: 1.025 (ref 1.005–1.030)
UROBILINOGEN UA: 0.2 mg/dL (ref 0.0–1.0)
pH: 6 (ref 5.0–8.0)

## 2014-04-07 LAB — CBC
HCT: 40.2 % (ref 36.0–46.0)
Hemoglobin: 14.4 g/dL (ref 12.0–15.0)
MCH: 30.6 pg (ref 26.0–34.0)
MCHC: 35.8 g/dL (ref 30.0–36.0)
MCV: 85.4 fL (ref 78.0–100.0)
Platelets: 210 10*3/uL (ref 150–400)
RBC: 4.71 MIL/uL (ref 3.87–5.11)
RDW: 12.6 % (ref 11.5–15.5)
WBC: 10.6 10*3/uL — ABNORMAL HIGH (ref 4.0–10.5)

## 2014-04-07 LAB — POCT PREGNANCY, URINE: Preg Test, Ur: POSITIVE — AB

## 2014-04-07 LAB — HCG, QUANTITATIVE, PREGNANCY: HCG, BETA CHAIN, QUANT, S: 3673 m[IU]/mL — AB (ref <5)

## 2014-04-07 LAB — WET PREP, GENITAL
Clue Cells Wet Prep HPF POC: NONE SEEN
Trich, Wet Prep: NONE SEEN
Yeast Wet Prep HPF POC: NONE SEEN

## 2014-04-07 LAB — URINE MICROSCOPIC-ADD ON

## 2014-04-07 NOTE — MAU Provider Note (Signed)
History     CSN: [id number]  Arrival date and time: 04/07/14 1339   None     Chief Complaint  Patient presents with  . Possible Pregnancy  . Abdominal Cramping   HPI This is a 23 y.o. female at [redacted]w[redacted]d who presents with c/o abdominal cramping. Denies bleeding. This is her first pregnancy. Does not have an OB doctor. [facility]. Had Mirena removed in early 2015. Used Plan B in May.  States they are trying to get pregnant now.   RN Note: Patient states she has had a positive home pregnancy test. States she drank a lot of alcohol over the weekend and is concerned. Has some lower abdominal cramping. Denies bleeding or discharge, nausea or vomiting.        OB History   Grav Para Term Preterm Abortions TAB SAB Ect Mult Living   1         0      Past Medical History  Diagnosis Date  . Gonorrhea   . Bacterial vaginosis   . Psoriasis   . Vaginal Pap smear, abnormal   . Hypertension     Past Surgical History  Procedure Laterality Date  . Tooth extraction      History reviewed. No pertinent family history.  History  Substance Use Topics  . Smoking status: Current Some Day Smoker -- 1.00 packs/day for 10 years    Types: Cigarettes  . Smokeless tobacco: Never Used  . Alcohol Use: Yes     Comment: Rare    Allergies: No Known Allergies  No prescriptions prior to admission    Review of Systems  Constitutional: Negative for fever, chills and malaise/fatigue.  Gastrointestinal: Positive for abdominal pain. Negative for nausea and vomiting.  Genitourinary: Negative for dysuria.  Neurological: Negative for dizziness.   Physical Exam   Blood pressure 133/74, pulse 86, temperature 98.9 F (37.2 C), temperature source Oral, resp. rate 16, height  (1.626 m), weight 83.915 kg (185 lb), last menstrual period 03/04/2014, SpO2 100.00%.  Physical Exam  Constitutional: She is oriented to person, place, and time. She appears well-developed and  well-nourished. No distress.  HENT:  Head: Normocephalic.  Cardiovascular: Normal rate.   Respiratory: Effort normal.  GI: Soft. She exhibits no distension. There is no tenderness. There is no rebound and no guarding.  Genitourinary: Vagina normal. No vaginal discharge (No blood in vault. Cervix long and closed, Uterus small) found.  Musculoskeletal: Normal range of motion.  Neurological: She is alert and oriented to person, place, and time.  Skin: Skin is warm and dry.  Psychiatric: She has a normal mood and affect.    MAU Course  Procedures  MDM Results for orders placed during the hospital encounter of 04/07/14 (from the past 24 hour(s))  URINALYSIS, ROUTINE W REFLEX MICROSCOPIC     Status: Abnormal   Collection Time    04/07/14  2:10 PM      Result Value Ref Range   Color, Urine YELLOW  YELLOW   APPearance CLOUDY (*) CLEAR   Specific Gravity, Urine 1.025  1.005 - 1.030   pH 6.0  5.0 - 8.0   Glucose, UA NEGATIVE  NEGATIVE mg/dL   Hgb urine dipstick NEGATIVE  NEGATIVE   Bilirubin Urine NEGATIVE  NEGATIVE   Ketones, ur NEGATIVE  NEGATIVE mg/dL   Protein, ur NEGATIVE  NEGATIVE mg/dL   Urobilinogen, UA 0.2  0.0 - 1.0 mg/dL   Nitrite NEGATIVE  NEGATIVE  Leukocytes, UA SMALL (*) NEGATIVE  URINE MICROSCOPIC-ADD ON     Status: Abnormal   Collection Time    04/07/14  2:10 PM      Result Value Ref Range   Squamous Epithelial / LPF MANY (*) RARE   WBC, UA 0-2  <3 WBC/hpf   RBC / HPF 3-6  <3 RBC/hpf   Bacteria, UA FEW (*) RARE   Urine-Other MUCOUS PRESENT    HCG, QUANTITATIVE, PREGNANCY     Status: Abnormal   Collection Time    04/07/14  2:20 PM      Result Value Ref Range   hCG, Beta Chain, Quant, S 3673 (*) <5 mIU/mL  CBC     Status: Abnormal   Collection Time    04/07/14  2:20 PM      Result Value Ref Range   WBC 10.6 (*) 4.0 - 10.5 K/uL   RBC 4.71  3.87 - 5.11 MIL/uL   Hemoglobin 14.4  12.0 - 15.0 g/dL   HCT 16.1  09.6 - 04.5 %   MCV 85.4  78.0 - 100.0 fL   MCH  30.6  26.0 - 34.0 pg   MCHC 35.8  30.0 - 36.0 g/dL   RDW 40.9  81.1 - 91.4 %   Platelets 210  150 - 400 K/uL  POCT PREGNANCY, URINE     Status: Abnormal   Collection Time    04/07/14  2:28 PM      Result Value Ref Range   Preg Test, Ur POSITIVE (*) NEGATIVE  WET PREP, GENITAL     Status: Abnormal   Collection Time    04/07/14  4:43 PM      Result Value Ref Range   Yeast Wet Prep HPF POC NONE SEEN  NONE SEEN   Trich, Wet Prep NONE SEEN  NONE SEEN   Clue Cells Wet Prep HPF POC NONE SEEN  NONE SEEN   WBC, Wet Prep HPF POC MANY (*) NONE SEEN    US Ob Transvaginal  04/07/2014   CLINICAL DATA:  Pelvic pain.  Positive pregnancy test.  EXAM: OBSTETRIC <14 WK [place] AND TRANSVAGINAL OB US  TECHNIQUE: Both transabdominal and transvaginal ultrasound examinations were performed for complete evaluation of the gestation as well as the maternal uterus, adnexal regions, and pelvic cul-de-sac. Transvaginal technique was performed to assess early pregnancy.  COMPARISON:  None.    FINDINGS: Intrauterine gestational sac: Visualized/normal in shape.                     Yolk sac:  Visualized                     Embryo:  Not visualized                                                MSD:  7  mm   4 w   5  d                     Maternal uterus/adnexae: Small right ovarian corpus luteum noted. Left ovary is normal in appearance.                   No adnexal mass or free fluid identified.    IMPRESSION: Single early intrauterine gestational sac measuring 4 weeks 5 days  by mean sac diameter.                            No significant maternal uterine or adnexal abnormality identified.     Electronically Signed   By: Myles Rosenthal M.D.   On: 04/07/2014 17:26   (worksheet states yolk sac was not seen)  Assessment and Plan  A:  Pregnancy at 107w6d        Abdominal pain in pregnancy  P:   Will repeat Quant in 48 hours        Repeat US in 7-10 days.   [facility] 04/07/2014, 4:35 PM

## 2014-04-07 NOTE — MAU Note (Signed)
Patient states she has had a positive home pregnancy test. States she drank a lot of alcohol over the weekend and is concerned. Has some lower abdominal cramping. Denies bleeding or discharge, nausea or vomiting.

## 2014-04-07 NOTE — Discharge Instructions (Signed)

## 2014-04-08 LAB — HIV ANTIBODY (ROUTINE TESTING W REFLEX): HIV 1&2 Ab, 4th Generation: NONREACTIVE

## 2014-04-08 LAB — GC/CHLAMYDIA PROBE AMP
CT Probe RNA: NEGATIVE
GC Probe RNA: NEGATIVE

## 2014-04-08 NOTE — MAU Provider Note (Signed)
Attestation of Attending Supervision of Advanced Practitioner (PA/CNM/NP): Evaluation and management procedures were performed by the Advanced Practitioner under my supervision and collaboration.  I have reviewed the Advanced Practitioner's note and chart, and I agree with the management and plan.  PRATT,TANYA S, MD [facility] Attending 04/08/2014 6:57 AM   

## 2014-04-09 ENCOUNTER — Inpatient Hospital Stay (HOSPITAL_COMMUNITY)
Admission: AD | Admit: 2014-04-09 | Discharge: 2014-04-09 | Disposition: A | Discharge status: Home / Self Care | Payer: Medicaid Other | Source: Ambulatory Visit | Attending: Obstetrics & Gynecology | Admitting: Obstetrics & Gynecology

## 2014-04-09 DIAGNOSIS — O99891 Other specified diseases and conditions complicating pregnancy: Secondary | ICD-10-CM | POA: Diagnosis present

## 2014-04-09 DIAGNOSIS — O99330 Smoking (tobacco) complicating pregnancy, unspecified trimester: Secondary | ICD-10-CM | POA: Diagnosis not present

## 2014-04-09 DIAGNOSIS — N949 Unspecified condition associated with female genital organs and menstrual cycle: Secondary | ICD-10-CM | POA: Insufficient documentation

## 2014-04-09 DIAGNOSIS — R102 Pelvic and perineal pain: Secondary | ICD-10-CM

## 2014-04-09 DIAGNOSIS — O9989 Other specified diseases and conditions complicating pregnancy, childbirth and the puerperium: Secondary | ICD-10-CM

## 2014-04-09 DIAGNOSIS — O26891 Other specified pregnancy related conditions, first trimester: Secondary | ICD-10-CM

## 2014-04-09 LAB — HCG, QUANTITATIVE, PREGNANCY: hCG, Beta Chain, Quant, S: 7222 m[IU]/mL — ABNORMAL HIGH (ref <5)

## 2014-04-09 NOTE — Discharge Instructions (Signed)
First Trimester of Pregnancy The first trimester of pregnancy is from week 1 until the end of week 12 (months 1 through 3). A week after a sperm fertilizes an egg, the egg will implant on the wall of the uterus. This embryo will begin to develop into a baby. Genes from you and your partner are forming the baby. The female genes determine whether the baby is a boy or a girl. At 6-8 weeks, the eyes and face are formed, and the heartbeat can be seen on ultrasound. At the end of 12 weeks, all the baby's organs are formed.  Now that you are pregnant, you will want to do everything you can to have a healthy baby. Two of the most important things are to get good prenatal care and to follow your health care provider's instructions. Prenatal care is all the medical care you receive before the baby's birth. This care will help prevent, find, and treat any problems during the pregnancy and childbirth. BODY CHANGES Your body goes through many changes during pregnancy. The changes vary from woman to woman.   You may gain or lose a couple of pounds at first.  You may feel sick to your stomach (nauseous) and throw up (vomit). If the vomiting is uncontrollable, call your health care provider.  You may tire easily.  You may develop headaches that can be relieved by medicines approved by your health care provider.  You may urinate more often. Painful urination may mean you have a bladder infection.  You may develop heartburn as a result of your pregnancy.  You may develop constipation because certain hormones are causing the muscles that push waste through your intestines to slow down.  You may develop hemorrhoids or swollen, bulging veins (varicose veins).  Your breasts may begin to grow larger and become tender. Your nipples may stick out more, and the tissue that surrounds them (areola) may become darker.  Your gums may bleed and may be sensitive to brushing and flossing.  Dark spots or blotches (chloasma,  mask of pregnancy) may develop on your face. This will likely fade after the baby is born.  Your menstrual periods will stop.  You may have a loss of appetite.  You may develop cravings for certain kinds of food.  You may have changes in your emotions from day to day, such as being excited to be pregnant or being concerned that something may go wrong with the pregnancy and baby.  You may have more vivid and strange dreams.  You may have changes in your hair. These can include thickening of your hair, rapid growth, and changes in texture. Some women also have hair loss during or after pregnancy, or hair that feels dry or thin. Your hair will most likely return to normal after your baby is born. WHAT TO EXPECT AT YOUR PRENATAL VISITS During a routine prenatal visit:  You will be weighed to make sure you and the baby are growing normally.  Your blood pressure will be taken.  Your abdomen will be measured to track your baby's growth.  The fetal heartbeat will be listened to starting around week 10 or 12 of your pregnancy.  Test results from any previous visits will be discussed. Your health care provider may ask you:  How you are feeling.  If you are feeling the baby move.  If you have had any abnormal symptoms, such as leaking fluid, bleeding, severe headaches, or abdominal cramping.  If you have any questions. Other tests   that may be performed during your first trimester include:  Blood tests to find your blood type and to check for the presence of any previous infections. They will also be used to check for low iron levels (anemia) and Rh antibodies. Later in the pregnancy, blood tests for diabetes will be done along with other tests if problems develop.  Urine tests to check for infections, diabetes, or protein in the urine.  An ultrasound to confirm the proper growth and development of the baby.  An amniocentesis to check for possible genetic problems.  Fetal screens for  spina bifida and Down syndrome.  You may need other tests to make sure you and the baby are doing well. HOME CARE INSTRUCTIONS  Medicines  Follow your health care provider's instructions regarding medicine use. Specific medicines may be either safe or unsafe to take during pregnancy.  Take your prenatal vitamins as directed.  If you develop constipation, try taking a stool softener if your health care provider approves. Diet  Eat regular, well-balanced meals. Choose a variety of foods, such as meat or vegetable-based protein, fish, milk and low-fat dairy products, vegetables, fruits, and whole grain breads and cereals. Your health care provider will help you determine the amount of weight gain that is right for you.  Avoid raw meat and uncooked cheese. These carry germs that can cause birth defects in the baby.  Eating four or five small meals rather than three large meals a day may help relieve nausea and vomiting. If you start to feel nauseous, eating a few soda crackers can be helpful. Drinking liquids between meals instead of during meals also seems to help nausea and vomiting.  If you develop constipation, eat more high-fiber foods, such as fresh vegetables or fruit and whole grains. Drink enough fluids to keep your urine clear or pale yellow. Activity and Exercise  Exercise only as directed by your health care provider. Exercising will help you:  Control your weight.  Stay in shape.  Be prepared for labor and delivery.  Experiencing pain or cramping in the lower abdomen or low back is a good sign that you should stop exercising. Check with your health care provider before continuing normal exercises.  Try to avoid standing for long periods of time. Move your legs often if you must stand in one place for a long time.  Avoid heavy lifting.  Wear low-heeled shoes, and practice good posture.  You may continue to have sex unless your health care provider directs you  otherwise. Relief of Pain or Discomfort  Wear a good support bra for breast tenderness.   Take warm sitz baths to soothe any pain or discomfort caused by hemorrhoids. Use hemorrhoid cream if your health care provider approves.   Rest with your legs elevated if you have leg cramps or low back pain.  If you develop varicose veins in your legs, wear support hose. Elevate your feet for 15 minutes, 3-4 times a day. Limit salt in your diet. Prenatal Care  Schedule your prenatal visits by the twelfth week of pregnancy. They are usually scheduled monthly at first, then more often in the last 2 months before delivery.  Write down your questions. Take them to your prenatal visits.  Keep all your prenatal visits as directed by your health care provider. Safety  Wear your seat belt at all times when driving.  Make a list of emergency phone numbers, including numbers for family, friends, the hospital, and police and fire departments. General Tips    Ask your health care provider for a referral to a local prenatal education class. Begin classes no later than at the beginning of month 6 of your pregnancy.  Ask for help if you have counseling or nutritional needs during pregnancy. Your health care provider can offer advice or refer you to specialists for help with various needs.  Do not use hot tubs, steam rooms, or saunas.  Do not douche or use tampons or scented sanitary pads.  Do not cross your legs for long periods of time.  Avoid cat litter boxes and soil used by cats. These carry germs that can cause birth defects in the baby and possibly loss of the fetus by miscarriage or stillbirth.  Avoid all smoking, herbs, alcohol, and medicines not prescribed by your health care provider. Chemicals in these affect the formation and growth of the baby.  Schedule a dentist appointment. At home, brush your teeth with a soft toothbrush and be gentle when you floss. SEEK MEDICAL CARE IF:   You have  dizziness.  You have mild pelvic cramps, pelvic pressure, or nagging pain in the abdominal area.  You have persistent nausea, vomiting, or diarrhea.  You have a bad smelling vaginal discharge.  You have pain with urination.  You notice increased swelling in your face, hands, legs, or ankles. SEEK IMMEDIATE MEDICAL CARE IF:   You have a fever.  You are leaking fluid from your vagina.  You have spotting or bleeding from your vagina.  You have severe abdominal cramping or pain.  You have rapid weight gain or loss.  You vomit blood or material that looks like coffee grounds.  You are exposed to German measles and have never had them.  You are exposed to fifth disease or chickenpox.  You develop a severe headache.  You have shortness of breath.  You have any kind of trauma, such as from a fall or a car accident. Document Released: 07/12/2001 Document Revised: 12/02/2013 Document Reviewed: 05/28/2013 Exit[employer] Patient Information 2015 [employer]. This information is not intended to replace advice given to you by your health care provider. Make sure you discuss any questions you have with your health care provider.  

## 2014-04-09 NOTE — MAU Note (Signed)
Patient to MAU for repeat BHCG. Denies pain or bleeding at this time. Did have a little abdominal pain last night, relieved by having a BM.

## 2014-04-09 NOTE — MAU Provider Note (Signed)
  History     CSN: [id number]  Arrival date and time: 04/09/14 1835   None     Chief Complaint  Patient presents with  . Follow-up   HPI  Lauren Sharp is a 23 y.o. G1P0 at [redacted]w[redacted]d who presents today for repeat HCG. She had [place] that showed IUP with yolk sac on 04/07/14. She denies any pain or bleeding today.   Past Medical History  Diagnosis Date  . Gonorrhea   . Bacterial vaginosis   . Psoriasis   . Vaginal Pap smear, abnormal   . Hypertension     Past Surgical History  Procedure Laterality Date  . Tooth extraction      No family history on file.  History  Substance Use Topics  . Smoking status: Current Some Day Smoker -- 1.00 packs/day for 10 years    Types: Cigarettes  . Smokeless tobacco: Never Used  . Alcohol Use: Yes     Comment: Rare    Allergies: No Known Allergies  No prescriptions prior to admission    ROS Physical Exam   Blood pressure 136/92, pulse 97, temperature 99.3 F (37.4 C), temperature source Oral, resp. rate 16, weight 83.643 kg (184 lb 6.4 oz), last menstrual period 03/04/2014, SpO2 99.00%.  Physical Exam  Nursing note and vitals reviewed. Constitutional: She is oriented to person, place, and time. She appears well-developed and well-nourished. No distress.  Respiratory: Effort normal.  GI: Soft.  Neurological: She is alert and oriented to person, place, and time.  Skin: Skin is warm and dry.  Psychiatric: She has a normal mood and affect.    MAU Course  Procedures  Results for Lauren Sharp, Lauren Sharp (MRN [id number]) as of 04/09/2014 20:17  Ref. Range 04/07/2014 14:20 04/07/2014 14:28 04/09/2014 18:57  hCG, Beta Chain, Quant, S Latest Range: <5 mIU/mL 3673 (H)  7222 (H)    Assessment and Plan   1. Pelvic pain affecting pregnancy in first trimester, antepartum    Appropriate rising HCG IUP with yolk sac on [place] Start [facility] as soon as possible First trimester precautions reviewed Return to MAU as needed   Follow-up Information   Schedule an  appointment as soon as possible for a visit with [facility] [place].   Contact information:   [address] [place] [address] [phone]     Lauren Sharp 04/09/2014, 8:25 PM

## 2014-04-12 ENCOUNTER — Inpatient Hospital Stay (HOSPITAL_COMMUNITY)
Admission: AD | Admit: 2014-04-12 | Discharge: 2014-04-12 | Disposition: A | Discharge status: Home / Self Care | Payer: Medicaid Other | Source: Ambulatory Visit | Attending: Obstetrics & Gynecology | Admitting: Obstetrics & Gynecology

## 2014-04-12 ENCOUNTER — Encounter (HOSPITAL_COMMUNITY): Payer: Self-pay

## 2014-04-12 DIAGNOSIS — R109 Unspecified abdominal pain: Secondary | ICD-10-CM

## 2014-04-12 DIAGNOSIS — O26899 Other specified pregnancy related conditions, unspecified trimester: Secondary | ICD-10-CM

## 2014-04-12 DIAGNOSIS — O99891 Other specified diseases and conditions complicating pregnancy: Secondary | ICD-10-CM | POA: Insufficient documentation

## 2014-04-12 DIAGNOSIS — O99330 Smoking (tobacco) complicating pregnancy, unspecified trimester: Secondary | ICD-10-CM | POA: Insufficient documentation

## 2014-04-12 DIAGNOSIS — O10019 Pre-existing essential hypertension complicating pregnancy, unspecified trimester: Secondary | ICD-10-CM | POA: Insufficient documentation

## 2014-04-12 DIAGNOSIS — O9989 Other specified diseases and conditions complicating pregnancy, childbirth and the puerperium: Principal | ICD-10-CM

## 2014-04-12 LAB — URINALYSIS, ROUTINE W REFLEX MICROSCOPIC
Bilirubin Urine: NEGATIVE
GLUCOSE, UA: NEGATIVE mg/dL
Hgb urine dipstick: NEGATIVE
KETONES UR: NEGATIVE mg/dL
Nitrite: NEGATIVE
PH: 6 (ref 5.0–8.0)
Protein, ur: NEGATIVE mg/dL
Specific Gravity, Urine: >1.03 — ABNORMAL HIGH (ref 1.005–1.030)
Urobilinogen, UA: 0.2 mg/dL (ref 0.0–1.0)

## 2014-04-12 LAB — URINE MICROSCOPIC-ADD ON

## 2014-04-12 LAB — HCG, QUANTITATIVE, PREGNANCY: HCG, BETA CHAIN, QUANT, S: 11809 m[IU]/mL — AB (ref <5)

## 2014-04-12 MED ORDER — NITROFURANTOIN MONOHYD MACRO 100 MG PO CAPS: 100.0000 mg | ORAL_CAPSULE | Freq: Two times a day (BID) | ORAL | Status: DC

## 2014-04-12 NOTE — MAU Note (Signed)
Pt states here for right flank pain. Starting to feel pain on left flank as well. Does have frequency/urgency. Denies burning. No bleeding noted. Does have yellow vaginal discharge with slight odor x2-3 days.

## 2014-04-12 NOTE — Discharge Instructions (Signed)

## 2014-04-12 NOTE — MAU Provider Note (Signed)
History     CSN: [id number]  Arrival date and time: 04/12/14 1148   First Provider Initiated Contact with Patient 04/12/14 1418      Chief Complaint  Patient presents with  . Urinary Tract Infection   HPI Pt is [redacted]w[redacted]d pregnant and presents with right flank pain.  Pt also notes some cramping.  Pt denies pain with urination but has some frequency.  Pt has hx of UTI and was asymptomatic, so pt is concerned she may had a UTI.   Pt is not really in pain at this time and does not need any pain medication. Pt was seen 3 days ago for HCG and 5 days ago for initial evaluation and HCG- Had IUGS w/YS on [place] 04/07/2014.  Pt denies spotting or bleeding. Pt had neg wet prep and GC/Chlamydia RN note: Katrine Coho, RN Registered Nurse Signed  MAU Note Service date: 04/12/2014 12:26 PM   Pt states here for right flank pain. Starting to feel pain on left flank as well. Does have frequency/urgency. Denies burning. No bleeding noted. Does have yellow vaginal discharge with slight odor x2-3 days.      Past Medical History  Diagnosis Date  . Gonorrhea   . Bacterial vaginosis   . Psoriasis   . Vaginal Pap smear, abnormal   . Hypertension     Past Surgical History  Procedure Laterality Date  . Tooth extraction    . No past surgeries      History reviewed. No pertinent family history.  History  Substance Use Topics  . Smoking status: Current Some Day Smoker -- 1.00 packs/day for 10 years    Types: Cigarettes  . Smokeless tobacco: Never Used  . Alcohol Use: Yes     Comment: Rare    Allergies: No Known Allergies  No prescriptions prior to admission    Review of Systems  Constitutional: Negative for fever and chills.  Gastrointestinal: Positive for abdominal pain. Negative for nausea, vomiting, diarrhea and constipation.  Genitourinary: Positive for frequency. Negative for dysuria and urgency.  Musculoskeletal: Positive for back pain.   Physical Exam   Blood pressure 127/67, pulse  69, temperature 98.7 F (37.1 C), temperature source Oral, resp. rate 18, height 5' 3.5" (1.613 m), weight 184 lb 8 oz (83.689 kg), last menstrual period 03/04/2014.  Physical Exam  Nursing note and vitals reviewed. Constitutional: She is oriented to person, place, and time. She appears well-developed and well-nourished. No distress.  HENT:  Head: Normocephalic.  Eyes: Pupils are equal, round, and reactive to light.  Neck: Normal range of motion. Neck supple.  Cardiovascular: Normal rate.   Respiratory: Effort normal.  No CVA tenderness  GI: Soft. She exhibits no distension. There is no tenderness. There is no rebound.  Musculoskeletal: Normal range of motion.  Neurological: She is alert and oriented to person, place, and time.  Skin: Skin is warm and dry.  Psychiatric: She has a normal mood and affect.    MAU Course  Procedures Results for orders placed during the hospital encounter of 04/12/14 (from the past 24 hour(s))  URINALYSIS, ROUTINE W REFLEX MICROSCOPIC     Status: Abnormal   Collection Time    04/12/14 12:32 PM      Result Value Ref Range   Color, Urine YELLOW  YELLOW   APPearance HAZY (*) CLEAR   Specific Gravity, Urine >1.030 (*) 1.005 - 1.030   pH 6.0  5.0 - 8.0   Glucose, UA NEGATIVE  NEGATIVE mg/dL  Hgb urine dipstick NEGATIVE  NEGATIVE   Bilirubin Urine NEGATIVE  NEGATIVE   Ketones, ur NEGATIVE  NEGATIVE mg/dL   Protein, ur NEGATIVE  NEGATIVE mg/dL   Urobilinogen, UA 0.2  0.0 - 1.0 mg/dL   Nitrite NEGATIVE  NEGATIVE   Leukocytes, UA SMALL (*) NEGATIVE  URINE MICROSCOPIC-ADD ON     Status: Abnormal   Collection Time    04/12/14 12:32 PM      Result Value Ref Range   Squamous Epithelial / LPF FEW (*) RARE   WBC, UA 7-10  <3 WBC/hpf   RBC / HPF 0-2  <3 RBC/hpf   Bacteria, UA FEW (*) RARE   Urine-Other MUCOUS PRESENT    HCG, QUANTITATIVE, PREGNANCY     Status: Abnormal   Collection Time    04/12/14  2:30 PM      Result Value Ref Range   hCG, Beta  Chain, Quant, S 11809 (*) <5 mIU/mL  Results for AUBRIANNA, ORCHARD (MRN [id number]) as of 04/12/2014 17:06  Ref. Range 04/07/2014 16:44 04/07/2014 17:20 04/09/2014 18:57 04/12/2014 12:32 04/12/2014 14:30  hCG, Beta Chain, Quant, S Latest Range: <5 mIU/mL   7222 (H)  11809 (H)   Will send urine for urine culture; Rx for Macrobid sent to pharmacy- however, pt does not have insurance- not sure how much it will cost Advised pt she could wait to get prescription filled until urine culture returned unless she started having sx, then she needs to start Rx Discussed HCG with pt and partner- not quite doubled- will repeat ultrasound in 1 week for viability   Assessment and Plan  abd pain in pregnancy ?UTI- Macrobid BID for 7 days Urine culture pending- results to pt when available Ultrasound for viability next Friday 9/18- [place] to call pt for appointment Return for increase in pain or bleeding  Eurika Sandy 04/12/2014, 2:29 PM

## 2014-04-14 LAB — CULTURE, OB URINE: Colony Count: 40000

## 2014-04-14 NOTE — MAU Provider Note (Signed)
Attestation of Attending Supervision of Advanced Practitioner: Evaluation and management procedures were performed by the PA/NP/CNM/OB Fellow under my supervision/collaboration. Chart reviewed and agree with management and plan.  Tilda Burrow 04/14/2014 6:33 PM

## 2014-04-17 ENCOUNTER — Ambulatory Visit (HOSPITAL_COMMUNITY)
Admission: RE | Admit: 2014-04-17 | Discharge: 2014-04-17 | Disposition: A | Discharge status: Home / Self Care | Payer: Medicaid Other | Source: Ambulatory Visit | Attending: Gynecology | Admitting: Gynecology

## 2014-04-17 DIAGNOSIS — O34599 Maternal care for other abnormalities of gravid uterus, unspecified trimester: Secondary | ICD-10-CM | POA: Insufficient documentation

## 2014-04-17 DIAGNOSIS — N831 Corpus luteum cyst of ovary, unspecified side: Secondary | ICD-10-CM | POA: Diagnosis not present

## 2014-04-17 DIAGNOSIS — N949 Unspecified condition associated with female genital organs and menstrual cycle: Secondary | ICD-10-CM | POA: Insufficient documentation

## 2014-04-17 DIAGNOSIS — O99891 Other specified diseases and conditions complicating pregnancy: Secondary | ICD-10-CM | POA: Insufficient documentation

## 2014-04-17 DIAGNOSIS — O9989 Other specified diseases and conditions complicating pregnancy, childbirth and the puerperium: Principal | ICD-10-CM

## 2014-05-08 LAB — OB RESULTS CONSOLE RUBELLA ANTIBODY, IGM: Rubella: IMMUNE

## 2014-05-08 LAB — OB RESULTS CONSOLE GC/CHLAMYDIA
Chlamydia: NEGATIVE
Gonorrhea: NEGATIVE

## 2014-05-08 LAB — OB RESULTS CONSOLE VARICELLA ZOSTER ANTIBODY, IGG: Varicella: IMMUNE

## 2014-05-08 LAB — OB RESULTS CONSOLE HEPATITIS B SURFACE ANTIGEN: Hepatitis B Surface Ag: NEGATIVE

## 2014-05-08 LAB — OB RESULTS CONSOLE RPR: RPR: NONREACTIVE

## 2014-06-02 ENCOUNTER — Encounter (HOSPITAL_COMMUNITY): Payer: Self-pay

## 2014-09-11 LAB — OB RESULTS CONSOLE RPR: RPR: NONREACTIVE

## 2014-09-11 LAB — OB RESULTS CONSOLE HGB/HCT, BLOOD: Hemoglobin: 11.3 g/dL

## 2014-09-11 LAB — OB RESULTS CONSOLE HIV ANTIBODY (ROUTINE TESTING): HIV: NONREACTIVE

## 2014-09-25 ENCOUNTER — Encounter (HOSPITAL_COMMUNITY): Payer: Self-pay | Admitting: *Deleted

## 2014-09-25 ENCOUNTER — Observation Stay (HOSPITAL_COMMUNITY)
Admission: AD | Admit: 2014-09-25 | Discharge: 2014-09-26 | Disposition: A | Discharge status: Home / Self Care | Payer: Medicaid Other | Source: Ambulatory Visit | Attending: Obstetrics and Gynecology | Admitting: Obstetrics and Gynecology

## 2014-09-25 DIAGNOSIS — O9989 Other specified diseases and conditions complicating pregnancy, childbirth and the puerperium: Secondary | ICD-10-CM | POA: Insufficient documentation

## 2014-09-25 DIAGNOSIS — O10013 Pre-existing essential hypertension complicating pregnancy, third trimester: Secondary | ICD-10-CM | POA: Diagnosis not present

## 2014-09-25 DIAGNOSIS — Z8619 Personal history of other infectious and parasitic diseases: Secondary | ICD-10-CM | POA: Insufficient documentation

## 2014-09-25 DIAGNOSIS — Z872 Personal history of diseases of the skin and subcutaneous tissue: Secondary | ICD-10-CM | POA: Insufficient documentation

## 2014-09-25 DIAGNOSIS — F17210 Nicotine dependence, cigarettes, uncomplicated: Secondary | ICD-10-CM | POA: Insufficient documentation

## 2014-09-25 DIAGNOSIS — R5383 Other fatigue: Secondary | ICD-10-CM | POA: Insufficient documentation

## 2014-09-25 DIAGNOSIS — O368390 Maternal care for abnormalities of the fetal heart rate or rhythm, unspecified trimester, not applicable or unspecified: Secondary | ICD-10-CM

## 2014-09-25 DIAGNOSIS — Z3A29 29 weeks gestation of pregnancy: Secondary | ICD-10-CM | POA: Diagnosis not present

## 2014-09-25 DIAGNOSIS — O99333 Smoking (tobacco) complicating pregnancy, third trimester: Secondary | ICD-10-CM | POA: Diagnosis not present

## 2014-09-25 DIAGNOSIS — O288 Other abnormal findings on antenatal screening of mother: Secondary | ICD-10-CM | POA: Insufficient documentation

## 2014-09-25 DIAGNOSIS — O133 Gestational [pregnancy-induced] hypertension without significant proteinuria, third trimester: Secondary | ICD-10-CM | POA: Diagnosis present

## 2014-09-25 LAB — CBC
HCT: 31.9 % — ABNORMAL LOW (ref 36.0–46.0)
Hemoglobin: 11.4 g/dL — ABNORMAL LOW (ref 12.0–15.0)
MCH: 29.7 pg (ref 26.0–34.0)
MCHC: 35.7 g/dL (ref 30.0–36.0)
MCV: 83.1 fL (ref 78.0–100.0)
PLATELETS: 160 10*3/uL (ref 150–400)
RBC: 3.84 MIL/uL — ABNORMAL LOW (ref 3.87–5.11)
RDW: 14.3 % (ref 11.5–15.5)
WBC: 11.6 10*3/uL — AB (ref 4.0–10.5)

## 2014-09-25 LAB — COMPREHENSIVE METABOLIC PANEL
ALBUMIN: 3 g/dL — AB (ref 3.5–5.2)
ALK PHOS: 74 U/L (ref 39–117)
ALT: 11 U/L (ref 0–35)
AST: 15 U/L (ref 0–37)
Anion gap: 4 — ABNORMAL LOW (ref 5–15)
BILIRUBIN TOTAL: 0.5 mg/dL (ref 0.3–1.2)
BUN: 6 mg/dL (ref 6–23)
CO2: 23 mmol/L (ref 19–32)
Calcium: 8.8 mg/dL (ref 8.4–10.5)
Chloride: 109 mmol/L (ref 96–112)
Creatinine, Ser: <0.3 mg/dL — ABNORMAL LOW (ref 0.50–1.10)
Glucose, Bld: 71 mg/dL (ref 70–99)
POTASSIUM: 2.7 mmol/L — AB (ref 3.5–5.1)
SODIUM: 136 mmol/L (ref 135–145)
Total Protein: 5.9 g/dL — ABNORMAL LOW (ref 6.0–8.3)

## 2014-09-25 MED ORDER — DOCUSATE SODIUM 100 MG PO CAPS
100.0000 mg | ORAL_CAPSULE | Freq: Every day | ORAL | Status: DC
  Administered 2014-09-26: 100 mg via ORAL
  Filled 2014-09-25: qty 1

## 2014-09-25 MED ORDER — CALCIUM CARBONATE ANTACID 500 MG PO CHEW: 2.0000 | CHEWABLE_TABLET | ORAL | Status: DC | PRN

## 2014-09-25 MED ORDER — BETAMETHASONE SOD PHOS & ACET 6 (3-3) MG/ML IJ SUSP
12.0000 mg | INTRAMUSCULAR | Status: AC
  Administered 2014-09-25 – 2014-09-26 (×2): 12 mg via INTRAMUSCULAR
  Filled 2014-09-25 (×2): qty 2

## 2014-09-25 MED ORDER — PRENATAL MULTIVITAMIN CH
1.0000 | ORAL_TABLET | Freq: Every day | ORAL | Status: DC
  Administered 2014-09-26: 1 via ORAL
  Filled 2014-09-25: qty 1

## 2014-09-25 MED ORDER — POTASSIUM CHLORIDE CRYS ER 10 MEQ PO TBCR
10.0000 meq | EXTENDED_RELEASE_TABLET | Freq: Three times a day (TID) | ORAL | Status: DC
  Administered 2014-09-25 – 2014-09-26 (×4): 10 meq via ORAL
  Filled 2014-09-25 (×6): qty 1

## 2014-09-25 MED ORDER — SODIUM CHLORIDE 0.9 % IV SOLN: 250.0000 mL | INTRAVENOUS | Status: DC | PRN

## 2014-09-25 MED ORDER — SODIUM CHLORIDE 0.9 % IJ SOLN: 3.0000 mL | INTRAMUSCULAR | Status: DC | PRN

## 2014-09-25 MED ORDER — ACETAMINOPHEN 325 MG PO TABS: 650.0000 mg | ORAL_TABLET | ORAL | Status: DC | PRN

## 2014-09-25 MED ORDER — SODIUM CHLORIDE 0.9 % IJ SOLN
3.0000 mL | Freq: Two times a day (BID) | INTRAMUSCULAR | Status: DC
  Administered 2014-09-25 – 2014-09-26 (×3): 3 mL via INTRAVENOUS

## 2014-09-25 MED ORDER — ZOLPIDEM TARTRATE 5 MG PO TABS
5.0000 mg | ORAL_TABLET | Freq: Every evening | ORAL | Status: DC | PRN
  Administered 2014-09-26: 5 mg via ORAL
  Filled 2014-09-25: qty 1

## 2014-09-25 NOTE — H&P (Signed)
Lauren Sharp is a 24 y.o. female, G1 P0, EGA [redacted] weeks presenting for further fetal evaluation after non-reactive NST and BPP 6/8 in the office.  She was seen for a routine visit today, c/o swelling, fatigue, not feeling well.  BP 140/90, no proteinuria.  Prenatal care uncomplicated up to this point, see prenatal records for complete history.  Maternal Medical History:  Fetal activity: Perceived fetal activity is normal.      OB History    Gravida Para Term Preterm AB TAB SAB Ectopic Multiple Living   1         0     Past Medical History  Diagnosis Date  . Gonorrhea   . Bacterial vaginosis   . Psoriasis   . Vaginal Pap smear, abnormal   . Hypertension    Past Surgical History  Procedure Laterality Date  . Tooth extraction    . No past surgeries     Family History: family history includes Alcohol abuse in her father; Arthritis in her father and mother; Asthma in her father; Cancer in her father; Depression in her mother; Diabetes in her father; Drug abuse in her father; Early death in her father; Heart disease in her father; Hyperlipidemia in her maternal grandfather, maternal grandmother, and mother; Learning disabilities in her mother and sister; Mental illness in her mother; Varicose Veins in her mother. Social History:  reports that she has been smoking Cigarettes.  She has a 10 pack-year smoking history. She has never used smokeless tobacco. She reports that she drinks alcohol. She reports that she does not use illicit drugs.  Review of Systems  Respiratory: Negative.   Cardiovascular: Negative.       Blood pressure 151/86, pulse 71, temperature 98.8 F (37.1 C), temperature source Oral, resp. rate 20, height 5\' 6"  (1.676 m), weight 99.791 kg (220 lb), last menstrual period 03/04/2014.   Fetal Exam Fetal Monitor Review: Mode: ultrasound.   Baseline rate: 140.  Variability: moderate (6-25 bpm).    Fetal State Assessment: Category I - tracings are normal.     Physical  Exam  Vitals reviewed. Constitutional: She appears well-developed and well-nourished.  Cardiovascular: Normal rate, regular rhythm and normal heart sounds.   No murmur heard. Respiratory: Effort normal and breath sounds normal. No respiratory distress.  GI: Soft.    Prenatal labs: ABO, Rh: --/--/O NEG (02/25 1545) Antibody: POS (02/25 1545) Rubella:   RPR:    HBsAg:    HIV: NONREACTIVE (09/07 1640)  GBS:     Assessment/Plan: IUP at 29 weeks with slightly elevated BP, normal labs so far except for low K+, non-reactive NST in the office with 6/8 BPP.  FHT here looks good for 29 weeks, moderate variability, no significant decels.  Will admit for observation, steroids for pulmonary maturation in case needs early delivery, repeat BPP in am, continuous monitoring, replace K+ PO and recheck in am.   Lauren Sharp D 09/25/2014, 5:17 PM

## 2014-09-25 NOTE — Progress Notes (Signed)
Results for Lauren Sharp, Lauren Sharp (MRN [id number]) as of 09/25/2014 17:12  Ref. Range 09/25/2014 15:45  Potassium Latest Range: 3.5-5.1 mmol/L 2.7 (LL)   Result shared with Dr. Jackelyn KnifeMeisinger who was at nurse's desk when lab called with above value for potassium.

## 2014-09-26 ENCOUNTER — Observation Stay (HOSPITAL_COMMUNITY): Payer: Medicaid Other

## 2014-09-26 DIAGNOSIS — Z3A29 29 weeks gestation of pregnancy: Secondary | ICD-10-CM | POA: Insufficient documentation

## 2014-09-26 DIAGNOSIS — O133 Gestational [pregnancy-induced] hypertension without significant proteinuria, third trimester: Secondary | ICD-10-CM | POA: Diagnosis present

## 2014-09-26 DIAGNOSIS — O288 Other abnormal findings on antenatal screening of mother: Secondary | ICD-10-CM | POA: Insufficient documentation

## 2014-09-26 LAB — COMPREHENSIVE METABOLIC PANEL
ALBUMIN: 3.2 g/dL — AB (ref 3.5–5.2)
ALK PHOS: 65 U/L (ref 39–117)
ALT: 10 U/L (ref 0–35)
AST: 12 U/L (ref 0–37)
Anion gap: 4 — ABNORMAL LOW (ref 5–15)
BUN: 9 mg/dL (ref 6–23)
CALCIUM: 9 mg/dL (ref 8.4–10.5)
CO2: 22 mmol/L (ref 19–32)
Chloride: 110 mmol/L (ref 96–112)
Creatinine, Ser: 0.32 mg/dL — ABNORMAL LOW (ref 0.50–1.10)
GFR calc Af Amer: >90 mL/min (ref >90)
GFR calc non Af Amer: >90 mL/min (ref >90)
GLUCOSE: 117 mg/dL — AB (ref 70–99)
POTASSIUM: 3 mmol/L — AB (ref 3.5–5.1)
Sodium: 136 mmol/L (ref 135–145)
Total Bilirubin: 0.6 mg/dL (ref 0.3–1.2)
Total Protein: 6 g/dL (ref 6.0–8.3)

## 2014-09-26 MED ORDER — POTASSIUM CHLORIDE CRYS ER 10 MEQ PO TBCR: 10.0000 meq | EXTENDED_RELEASE_TABLET | Freq: Three times a day (TID) | ORAL | Status: DC

## 2014-09-26 NOTE — Discharge Instructions (Signed)
Call for headache, vision changes, upper abdominal pain, severe nausea or vomiting

## 2014-09-26 NOTE — Progress Notes (Signed)
BPP 8/8, will d/c home this pm

## 2014-09-26 NOTE — Progress Notes (Signed)
HD #2, [redacted]W[redacted]D, PIH with NR NST and 6/8 BPP Feels ok today, just tired Afeb, VSS, BP 140-150/90 FHT- reactive tracing for 29 weeks, no significant decels Labs ok except K+ still low, up to 3.0 from 2.7 Will continue monitoring, repeat BPP today, continue PO K+, plan to d/c home later today after gets 2nd betamethasone with close f/u if BPP ok

## 2014-09-27 ENCOUNTER — Encounter (HOSPITAL_COMMUNITY): Payer: Self-pay

## 2014-09-27 ENCOUNTER — Inpatient Hospital Stay (HOSPITAL_COMMUNITY)
Admission: AD | Admit: 2014-09-27 | Discharge: 2014-09-27 | Disposition: A | Discharge status: Home / Self Care | Payer: Medicaid Other | Source: Ambulatory Visit | Attending: Obstetrics and Gynecology | Admitting: Obstetrics and Gynecology

## 2014-09-27 DIAGNOSIS — F17210 Nicotine dependence, cigarettes, uncomplicated: Secondary | ICD-10-CM | POA: Diagnosis not present

## 2014-09-27 DIAGNOSIS — Z3A29 29 weeks gestation of pregnancy: Secondary | ICD-10-CM | POA: Diagnosis not present

## 2014-09-27 DIAGNOSIS — O99333 Smoking (tobacco) complicating pregnancy, third trimester: Secondary | ICD-10-CM | POA: Diagnosis not present

## 2014-09-27 DIAGNOSIS — E876 Hypokalemia: Secondary | ICD-10-CM | POA: Insufficient documentation

## 2014-09-27 DIAGNOSIS — R030 Elevated blood-pressure reading, without diagnosis of hypertension: Secondary | ICD-10-CM | POA: Diagnosis present

## 2014-09-27 DIAGNOSIS — O212 Late vomiting of pregnancy: Secondary | ICD-10-CM | POA: Insufficient documentation

## 2014-09-27 DIAGNOSIS — O133 Gestational [pregnancy-induced] hypertension without significant proteinuria, third trimester: Secondary | ICD-10-CM | POA: Insufficient documentation

## 2014-09-27 DIAGNOSIS — O26893 Other specified pregnancy related conditions, third trimester: Secondary | ICD-10-CM

## 2014-09-27 DIAGNOSIS — R198 Other specified symptoms and signs involving the digestive system and abdomen: Secondary | ICD-10-CM

## 2014-09-27 LAB — URINALYSIS, ROUTINE W REFLEX MICROSCOPIC
Bilirubin Urine: NEGATIVE
GLUCOSE, UA: NEGATIVE mg/dL
HGB URINE DIPSTICK: NEGATIVE
Ketones, ur: NEGATIVE mg/dL
NITRITE: NEGATIVE
Protein, ur: 30 mg/dL — AB
Specific Gravity, Urine: 1.025 (ref 1.005–1.030)
UROBILINOGEN UA: 1 mg/dL (ref 0.0–1.0)
pH: 6.5 (ref 5.0–8.0)

## 2014-09-27 LAB — URINE MICROSCOPIC-ADD ON

## 2014-09-27 LAB — TYPE AND SCREEN
ABO/RH(D): O NEG
ANTIBODY SCREEN: POSITIVE
DAT, IGG: NEGATIVE
UNIT DIVISION: 0
Unit division: 0

## 2014-09-27 LAB — COMPREHENSIVE METABOLIC PANEL
ALBUMIN: 3 g/dL — AB (ref 3.5–5.2)
ALT: 9 U/L (ref 0–35)
ANION GAP: 7 (ref 5–15)
AST: 20 U/L (ref 0–37)
Alkaline Phosphatase: 61 U/L (ref 39–117)
BUN: 10 mg/dL (ref 6–23)
CO2: 21 mmol/L (ref 19–32)
CREATININE: 0.32 mg/dL — AB (ref 0.50–1.10)
Calcium: 8.9 mg/dL (ref 8.4–10.5)
Chloride: 109 mmol/L (ref 96–112)
Glucose, Bld: 108 mg/dL — ABNORMAL HIGH (ref 70–99)
POTASSIUM: 2.7 mmol/L — AB (ref 3.5–5.1)
Sodium: 137 mmol/L (ref 135–145)
Total Bilirubin: 0.6 mg/dL (ref 0.3–1.2)
Total Protein: 5.7 g/dL — ABNORMAL LOW (ref 6.0–8.3)

## 2014-09-27 LAB — CBC
HCT: 34.1 % — ABNORMAL LOW (ref 36.0–46.0)
Hemoglobin: 12 g/dL (ref 12.0–15.0)
MCH: 29.3 pg (ref 26.0–34.0)
MCHC: 35.2 g/dL (ref 30.0–36.0)
MCV: 83.4 fL (ref 78.0–100.0)
Platelets: 157 10*3/uL (ref 150–400)
RBC: 4.09 MIL/uL (ref 3.87–5.11)
RDW: 14.3 % (ref 11.5–15.5)
WBC: 11.9 10*3/uL — AB (ref 4.0–10.5)

## 2014-09-27 LAB — LACTATE DEHYDROGENASE: LDH: 173 U/L (ref 94–250)

## 2014-09-27 LAB — PROTEIN / CREATININE RATIO, URINE
Creatinine, Urine: 176 mg/dL
Protein Creatinine Ratio: 0.27 — ABNORMAL HIGH (ref 0.00–0.15)
Total Protein, Urine: 47 mg/dL

## 2014-09-27 LAB — URIC ACID: Uric Acid, Serum: 4.4 mg/dL (ref 2.4–7.0)

## 2014-09-27 MED ORDER — PROMETHAZINE HCL 25 MG/ML IJ SOLN
25.0000 mg | Freq: Once | INTRAVENOUS | Status: AC
  Administered 2014-09-27: 25 mg via INTRAVENOUS
  Filled 2014-09-27: qty 1

## 2014-09-27 MED ORDER — PROMETHAZINE HCL 25 MG PO TABS: 25.0000 mg | ORAL_TABLET | Freq: Four times a day (QID) | ORAL | Status: DC | PRN

## 2014-09-27 NOTE — MAU Note (Signed)
Pt states nausea and vomiting since 0300 this am. Has not been around anyone else who has been sick.

## 2014-09-27 NOTE — MAU Provider Note (Signed)
History     CSN: [id number]  Arrival date and time: 09/27/14 0745  Seen by provider at [phone]    Chief Complaint  Patient presents with  . Vomiting   HPI Lauren Sharp 24 y.o. [id number]  Comes to MAU today for repeat PIH labs.  Additionally at 3 am today she began having vomiting.  Has vomited several times.  Has a headache.  Dr. Senaida Oresichardson called in orders for her.  Has had elevated BP in the office and has been watched carefully.    OB History    Gravida Para Term Preterm AB TAB SAB Ectopic Multiple Living   1         0      Past Medical History  Diagnosis Date  . Gonorrhea   . Bacterial vaginosis   . Psoriasis   . Vaginal Pap smear, abnormal   . Hypertension     Past Surgical History  Procedure Laterality Date  . Tooth extraction    . No past surgeries      Family History  Problem Relation Age of Onset  . Arthritis Mother   . Depression Mother   . Hyperlipidemia Mother   . Learning disabilities Mother   . Mental illness Mother   . Varicose Veins Mother   . Alcohol abuse Father   . Arthritis Father   . Asthma Father   . Cancer Father   . Drug abuse Father   . Diabetes Father   . Early death Father   . Heart disease Father   . Learning disabilities Sister   . Hyperlipidemia Maternal Grandmother   . Hyperlipidemia Maternal Grandfather     History  Substance Use Topics  . Smoking status: Current Some Day Smoker -- 1.00 packs/day for 10 years    Types: Cigarettes  . Smokeless tobacco: Never Used  . Alcohol Use: Yes     Comment: Rare    Allergies: No Known Allergies  Prescriptions prior to admission  Medication Sig Dispense Refill Last Dose  . potassium chloride (K-DUR,KLOR-CON) 10 MEQ tablet Take 1 tablet (10 mEq total) by mouth 3 (three) times daily. 30 tablet 2   . Prenatal Vit-Fe Fumarate-FA (PRENATAL MULTIVITAMIN) TABS tablet Take 1 tablet by mouth daily at 12 noon.   09/24/2014 at Unknown time    Review of Systems  Constitutional: Negative for  fever.  Gastrointestinal: Negative for nausea, vomiting, abdominal pain and diarrhea.  Genitourinary:       No vaginal discharge. No vaginal bleeding. No dysuria.   Physical Exam   Blood pressure 144/81, pulse 88, temperature 97.2 F (36.2 C), temperature source Oral, resp. rate 18, last menstrual period 03/04/2014.  Physical Exam  Nursing note and vitals reviewed. Constitutional: She is oriented to person, place, and time. She appears well-developed and well-nourished.  HENT:  Head: Normocephalic.  Eyes: EOM are normal.  Neck: Neck supple.  Respiratory: Effort normal.  GI: Soft. There is no tenderness.  Musculoskeletal: Normal range of motion.  Neurological: She is alert and oriented to person, place, and time.  Skin: Skin is warm and dry.  Psychiatric: She has a normal mood and affect.    MAU Course  Procedures Results for orders placed or performed during the hospital encounter of 09/27/14 (from the past 24 hour(s))  Urinalysis, Routine w reflex microscopic     Status: Abnormal   Collection Time: 09/27/14  8:00 AM  Result Value Ref Range   Color, Urine YELLOW YELLOW   APPearance HAZY (A)  CLEAR   Specific Gravity, Urine 1.025 1.005 - 1.030   pH 6.5 5.0 - 8.0   Glucose, UA NEGATIVE NEGATIVE mg/dL   Hgb urine dipstick NEGATIVE NEGATIVE   Bilirubin Urine NEGATIVE NEGATIVE   Ketones, ur NEGATIVE NEGATIVE mg/dL   Protein, ur 30 (A) NEGATIVE mg/dL   Urobilinogen, UA 1.0 0.0 - 1.0 mg/dL   Nitrite NEGATIVE NEGATIVE   Leukocytes, UA SMALL (A) NEGATIVE  Protein / creatinine ratio, urine     Status: Abnormal   Collection Time: 09/27/14  8:00 AM  Result Value Ref Range   Creatinine, Urine 176.00 mg/dL   Total Protein, Urine 47 mg/dL   Protein Creatinine Ratio 0.27 (H) 0.00 - 0.15  Urine microscopic-add on     Status: Abnormal   Collection Time: 09/27/14  8:00 AM  Result Value Ref Range   Squamous Epithelial / LPF MANY (A) RARE   WBC, UA 7-10 <3 WBC/hpf   RBC / HPF 0-2 <3  RBC/hpf   Bacteria, UA MANY (A) RARE  CBC     Status: Abnormal   Collection Time: 09/27/14  9:00 AM  Result Value Ref Range   WBC 11.9 (H) 4.0 - 10.5 K/uL   RBC 4.09 3.87 - 5.11 MIL/uL   Hemoglobin 12.0 12.0 - 15.0 g/dL   HCT 16.1 (L) 09.6 - 04.5 %   MCV 83.4 78.0 - 100.0 fL   MCH 29.3 26.0 - 34.0 pg   MCHC 35.2 30.0 - 36.0 g/dL   RDW 40.9 81.1 - 91.4 %   Platelets 157 150 - 400 K/uL  Comprehensive metabolic panel     Status: Abnormal   Collection Time: 09/27/14  9:00 AM  Result Value Ref Range   Sodium 137 135 - 145 mmol/L   Potassium 2.7 (LL) 3.5 - 5.1 mmol/L   Chloride 109 96 - 112 mmol/L   CO2 21 19 - 32 mmol/L   Glucose, Bld 108 (H) 70 - 99 mg/dL   BUN 10 6 - 23 mg/dL   Creatinine, Ser 7.82 (L) 0.50 - 1.10 mg/dL   Calcium 8.9 8.4 - 95.6 mg/dL   Total Protein 5.7 (L) 6.0 - 8.3 g/dL   Albumin 3.0 (L) 3.5 - 5.2 g/dL   AST 20 0 - 37 U/L   ALT 9 0 - 35 U/L   Alkaline Phosphatase 61 39 - 117 U/L   Total Bilirubin 0.6 0.3 - 1.2 mg/dL   GFR calc non Af Amer >90 >90 mL/min   GFR calc Af Amer >90 >90 mL/min   Anion gap 7 5 - 15  Uric acid     Status: None   Collection Time: 09/27/14  9:00 AM  Result Value Ref Range   Uric Acid, Serum 4.4 2.4 - 7.0 mg/dL  Lactate dehydrogenase     Status: None   Collection Time: 09/27/14  9:00 AM  Result Value Ref Range   LDH 173 94 - 250 U/L    MDM Had IVF with Phenergan for the vomiting. Consult with Dr. Senaida Ores re: plan of care.  Will discharge home and do 24 hour urine.  Assessment and Plan  Gestational hypertension with proteinuria Vomiting Low potassium Reactive NST  Plan Pick up your potassium pills at the pharmacy and take as directed Will prescribe Phenergan to help you if you are not able to keep down the potassium. Begin a 24 hour urine on Monday and bring to your appointment in the office on Tuesday. Expect the baby to move every  day. Call your doctor if your symptoms worsen.  BURLESON,TERRI 09/27/2014, 8:48 AM

## 2014-09-27 NOTE — Discharge Instructions (Signed)
24 hour urine.  Do on Monday - Tuesday and take with you to the MD office. Call your doctor if you cannot keep down the potassium tablets.

## 2014-09-27 NOTE — MAU Note (Signed)
Lab notified RN of pt's potassium level of 2.7. Will inform TBurleson, NP.

## 2014-09-28 NOTE — Discharge Summary (Signed)
Physician Discharge Summary  Patient ID: Lauren Sharp MRN: [id number] DOB/AGE: 23/04/1991 23 y.o.  Admit date: 09/25/2014 Discharge date: 09/26/2014  Admission Diagnoses:  IUP at 29 weeks, Non-reactive NST, possible PIH  Discharge Diagnoses:  IUP at 29 weeks, non-reactive NST, PIH, hypokalemia Active Problems:   Gestational hypertension w/o significant proteinuria in 3rd trimester   Non-reactive NST (non-stress test)   [redacted] weeks gestation of pregnancy   Discharged Condition: good  Hospital Course: Pt admitted for extended monitoring for non-reactive NST and 6/8 BPP.  BP also slightly elevated with normal labs except for hypokalemia.  On extended monitoring, NST reactive, repeat BPP am after admission 8/8.  BP remained 140-150/90, K+ slightly improved with PO K+.   Discharge Exam: Blood pressure 131/67, pulse 79, temperature 98.5 F (36.9 C), temperature source Oral, resp. rate 20, height 5\' 6"  (1.676 m), weight 99.791 kg (220 lb), last menstrual period 03/04/2014. General appearance: alert  Disposition: 01-Home or Self Care  Discharge Instructions    Discharge activity:  No Restrictions    Complete by:  As directed      Discharge diet:  No restrictions    Complete by:  As directed      Fetal Kick Count:  Lie on our left side for one hour after a meal, and count the number of times your baby kicks.  If it is less than 5 times, get up, move around and drink some juice.  Repeat the test 30 minutes later.  If it is still less than 5 kicks in an hour, notify your doctor.    Complete by:  As directed      No sexual activity restrictions    Complete by:  As directed             Medication List    STOP taking these medications        nitrofurantoin (macrocrystal-monohydrate) 100 MG capsule  Commonly known as:  MACROBID      TAKE these medications        potassium chloride 10 MEQ tablet  Commonly known as:  K-DUR,KLOR-CON  Take 1 tablet (10 mEq total) by mouth 3 (three) times  daily.     prenatal multivitamin Tabs tablet  Take 1 tablet by mouth daily at 12 noon.           Follow-up Information    Follow up with Bing PlumeHENLEY,THOMAS F, MD In 4 days.   Specialty:  Obstetrics and Gynecology   Contact information:   [address], [address] [place] [id number] [phone]       Signed: Zenaida NieceMEISINGER,Meghna Hagmann D 09/28/2014, 8:56 PM

## 2014-10-10 ENCOUNTER — Inpatient Hospital Stay (HOSPITAL_COMMUNITY)
Admission: AD | Admit: 2014-10-10 | Discharge: 2014-10-10 | Disposition: A | Discharge status: Home / Self Care | Payer: Medicaid Other | Source: Ambulatory Visit | Attending: Obstetrics and Gynecology | Admitting: Obstetrics and Gynecology

## 2014-10-10 ENCOUNTER — Encounter (HOSPITAL_COMMUNITY): Payer: Self-pay | Admitting: *Deleted

## 2014-10-10 DIAGNOSIS — O99333 Smoking (tobacco) complicating pregnancy, third trimester: Secondary | ICD-10-CM | POA: Diagnosis not present

## 2014-10-10 DIAGNOSIS — O133 Gestational [pregnancy-induced] hypertension without significant proteinuria, third trimester: Secondary | ICD-10-CM | POA: Insufficient documentation

## 2014-10-10 DIAGNOSIS — Z3A31 31 weeks gestation of pregnancy: Secondary | ICD-10-CM | POA: Diagnosis not present

## 2014-10-10 DIAGNOSIS — F17210 Nicotine dependence, cigarettes, uncomplicated: Secondary | ICD-10-CM | POA: Insufficient documentation

## 2014-10-10 DIAGNOSIS — Z3A30 30 weeks gestation of pregnancy: Secondary | ICD-10-CM

## 2014-10-10 DIAGNOSIS — R030 Elevated blood-pressure reading, without diagnosis of hypertension: Secondary | ICD-10-CM | POA: Diagnosis present

## 2014-10-10 LAB — COMPREHENSIVE METABOLIC PANEL
ALT: 11 U/L (ref 0–35)
AST: 16 U/L (ref 0–37)
Albumin: 3 g/dL — ABNORMAL LOW (ref 3.5–5.2)
Alkaline Phosphatase: 86 U/L (ref 39–117)
Anion gap: 10 (ref 5–15)
BUN: <5 mg/dL — ABNORMAL LOW (ref 6–23)
CO2: 21 mmol/L (ref 19–32)
Calcium: 9.5 mg/dL (ref 8.4–10.5)
Chloride: 106 mmol/L (ref 96–112)
Creatinine, Ser: 0.35 mg/dL — ABNORMAL LOW (ref 0.50–1.10)
GFR calc Af Amer: >90 mL/min (ref >90)
GFR calc non Af Amer: >90 mL/min (ref >90)
Glucose, Bld: 101 mg/dL — ABNORMAL HIGH (ref 70–99)
Potassium: 2.8 mmol/L — ABNORMAL LOW (ref 3.5–5.1)
Sodium: 137 mmol/L (ref 135–145)
Total Bilirubin: 0.5 mg/dL (ref 0.3–1.2)
Total Protein: 6.3 g/dL (ref 6.0–8.3)

## 2014-10-10 LAB — URINALYSIS, ROUTINE W REFLEX MICROSCOPIC
Bilirubin Urine: NEGATIVE
GLUCOSE, UA: NEGATIVE mg/dL
HGB URINE DIPSTICK: NEGATIVE
Ketones, ur: NEGATIVE mg/dL
Nitrite: NEGATIVE
PROTEIN: NEGATIVE mg/dL
Specific Gravity, Urine: 1.015 (ref 1.005–1.030)
Urobilinogen, UA: 0.2 mg/dL (ref 0.0–1.0)
pH: 7 (ref 5.0–8.0)

## 2014-10-10 LAB — CBC
HCT: 35.3 % — ABNORMAL LOW (ref 36.0–46.0)
Hemoglobin: 12.5 g/dL (ref 12.0–15.0)
MCH: 29.2 pg (ref 26.0–34.0)
MCHC: 35.4 g/dL (ref 30.0–36.0)
MCV: 82.5 fL (ref 78.0–100.0)
Platelets: 176 10*3/uL (ref 150–400)
RBC: 4.28 MIL/uL (ref 3.87–5.11)
RDW: 14.7 % (ref 11.5–15.5)
WBC: 12.1 10*3/uL — ABNORMAL HIGH (ref 4.0–10.5)

## 2014-10-10 LAB — PROTEIN / CREATININE RATIO, URINE
Creatinine, Urine: 63 mg/dL
Protein Creatinine Ratio: 0.21 — ABNORMAL HIGH (ref 0.00–0.15)
Total Protein, Urine: 13 mg/dL

## 2014-10-10 LAB — LACTATE DEHYDROGENASE: LDH: 128 U/L (ref 94–250)

## 2014-10-10 LAB — URINE MICROSCOPIC-ADD ON

## 2014-10-10 LAB — URIC ACID: Uric Acid, Serum: 4.2 mg/dL (ref 2.4–7.0)

## 2014-10-10 MED ORDER — LABETALOL HCL 100 MG PO TABS
100.0000 mg | ORAL_TABLET | Freq: Once | ORAL | Status: AC
  Administered 2014-10-10: 100 mg via ORAL
  Filled 2014-10-10: qty 1

## 2014-10-10 NOTE — MAU Note (Signed)
Pt presents from the office for a BP evaluation. States her pressures were 190s in the office. Pt supposed to take BP medication but doesn't know what kind and hasn't taken it in 2 days. Denies vaginal bleeding or discharge. Reports good fetal movement. Denies HA. Reports blurred vision yesterday.

## 2014-10-10 NOTE — Discharge Instructions (Signed)

## 2014-10-10 NOTE — MAU Provider Note (Signed)
History     CSN: [id number]  Arrival date and time: 10/10/14 1147   None     Chief Complaint  Patient presents with  . BP Eval    HPI  24 y.o. G1P0  presents with elevated blood pressure from the office. Denies headache, visual disturbances, epigastric pain. States that she just started the labetolol 2 days ago and it made her feel dizzy and she stopped taking it. Denies vaginal bleeding ,LOF, contractions. Reports good fetal movement.  Past Medical History  Diagnosis Date  . Gonorrhea   . Bacterial vaginosis   . Psoriasis   . Vaginal Pap smear, abnormal   . Hypertension     Past Surgical History  Procedure Laterality Date  . Tooth extraction    . No past surgeries      Family History  Problem Relation Age of Onset  . Arthritis Mother   . Depression Mother   . Hyperlipidemia Mother   . Learning disabilities Mother   . Mental illness Mother   . Varicose Veins Mother   . Alcohol abuse Father   . Arthritis Father   . Asthma Father   . Cancer Father   . Drug abuse Father   . Diabetes Father   . Early death Father   . Heart disease Father   . Learning disabilities Sister   . Hyperlipidemia Maternal Grandmother   . Hyperlipidemia Maternal Grandfather     History  Substance Use Topics  . Smoking status: Current Some Day Smoker -- 1.00 packs/day for 10 years    Types: Cigarettes  . Smokeless tobacco: Never Used  . Alcohol Use: Yes     Comment: Rare    Allergies: No Known Allergies  Prescriptions prior to admission  Medication Sig Dispense Refill Last Dose  . labetalol (NORMODYNE) 100 MG tablet Take 100 mg by mouth 2 (two) times daily.   10/08/2014 at 2100  . potassium chloride (K-DUR,KLOR-CON) 10 MEQ tablet Take 1 tablet (10 mEq total) by mouth 3 (three) times daily. 30 tablet 2 10/09/2014 at Unknown time  . Prenatal Vit-Fe Fumarate-FA (PRENATAL MULTIVITAMIN) TABS tablet Take 1 tablet by mouth daily at 12 noon.   10/09/2014 at Unknown time  .  promethazine (PHENERGAN) 25 MG tablet Take 1 tablet (25 mg total) by mouth every 6 (six) hours as needed for nausea or vomiting. (Patient not taking: Reported on 10/10/2014) 20 tablet 0     Review of Systems  Constitutional: Negative for fever and chills.  Eyes: Negative for blurred vision, double vision, photophobia and pain.  Respiratory: Negative for cough and shortness of breath.   Cardiovascular: Negative for chest pain and leg swelling.  Gastrointestinal: Negative for nausea, vomiting and abdominal pain.  Genitourinary: Negative for dysuria, urgency and frequency.  Musculoskeletal: Negative for back pain.  Skin: Negative.   Neurological: Negative.  Negative for headaches.  Endo/Heme/Allergies: Negative.   Psychiatric/Behavioral: Negative.    Physical Exam   Blood pressure 152/91, pulse 90, temperature 98 F (36.7 C), temperature source Oral, resp. rate 18, height  (1.676 m), weight 97.886 kg (215 lb 12.8 oz), last menstrual period 03/04/2014.  Physical Exam  Constitutional: She is oriented to person, place, and time. She appears well-developed and well-nourished. No distress.  HENT:  Head: Normocephalic and atraumatic.  Neck: Normal range of motion.  Cardiovascular: Normal rate.   Respiratory: Effort normal. No respiratory distress.  Musculoskeletal: She exhibits edema.  Bilateral feet  Neurological: She is alert and oriented to person,  place, and time. She has normal reflexes. She displays normal reflexes.  Skin: Skin is warm and dry.  Psychiatric: She has a normal mood and affect. Her behavior is normal. Judgment and thought content normal.   10/10/14 1212  --  90  --  --  152/91 mmHg  --  --  --  -- JL     10/10/14 1206  --  95  --  --  150/99 mmHg  --  --  --  -- JL    10/10/14 1155  98 F (36.7 C)  85  --  18  171/94 mmHg  --           10/10/14 1357  --  84  --  --  158/99 mmHg  --  --  --  -- JL     10/10/14 1342  --  89  --  --   155/106 mmHg  --  --  --   -- JL    10/10/14 1327  --  89  --  --  161/100 mmHg  --  --  --  -- JL    10/10/14 1312  --  86  --  --  145/95 mmHg  --  --  --  -- JL    10/10/14 1257  --  101  --  --  154/99 mmHg  --  --  --  -- JL    10/10/14 1242  --  93  --  --  151/96 mmHg  --  --  --  -- JL    10/10/14 1227  --  105  --  --   165/105 mmHg  --  --  --  -- JL       MAU Course  Procedures  CBC CMP LDH Uric acid Prot/creat Ratio Labetolol 100mg  po Results for orders placed or performed during the hospital encounter of 10/10/14 (from the past 24 hour(s))  Protein / creatinine ratio, urine     Status: Abnormal   Collection Time: 10/10/14 11:50 AM  Result Value Ref Range   Creatinine, Urine 63.00 mg/dL   Total Protein, Urine 13 mg/dL   Protein Creatinine Ratio 0.21 (H) 0.00 - 0.15  Urinalysis, Routine w reflex microscopic     Status: Abnormal   Collection Time: 10/10/14 11:58 AM  Result Value Ref Range   Color, Urine YELLOW YELLOW   APPearance CLEAR CLEAR   Specific Gravity, Urine 1.015 1.005 - 1.030   pH 7.0 5.0 - 8.0   Glucose, UA NEGATIVE NEGATIVE mg/dL   Hgb urine dipstick NEGATIVE NEGATIVE   Bilirubin Urine NEGATIVE NEGATIVE   Ketones, ur NEGATIVE NEGATIVE mg/dL   Protein, ur NEGATIVE NEGATIVE mg/dL   Urobilinogen, UA 0.2 0.0 - 1.0 mg/dL   Nitrite NEGATIVE NEGATIVE   Leukocytes, UA MODERATE (A) NEGATIVE  Urine microscopic-add on     Status: None   Collection Time: 10/10/14 11:58 AM  Result Value Ref Range   Squamous Epithelial / LPF RARE RARE   WBC, UA 3-6 <3 WBC/hpf   Bacteria, UA RARE RARE  CBC     Status: Abnormal   Collection Time: 10/10/14 12:30 PM  Result Value Ref Range   WBC 12.1 (H) 4.0 - 10.5 K/uL   RBC 4.28 3.87 - 5.11 MIL/uL   Hemoglobin 12.5 12.0 - 15.0 g/dL   HCT 40.935.3 (L) 81.136.0 - 91.446.0 %   MCV 82.5 78.0 - 100.0 fL   MCH 29.2 26.0 - 34.0 pg   MCHC  35.4 30.0 - 36.0 g/dL   RDW 16.1 09.6 - 04.5 %   Platelets 176 150 - 400 K/uL  Comprehensive metabolic panel     Status:  Abnormal   Collection Time: 10/10/14 12:30 PM  Result Value Ref Range   Sodium 137 135 - 145 mmol/L   Potassium 2.8 (L) 3.5 - 5.1 mmol/L   Chloride 106 96 - 112 mmol/L   CO2 21 19 - 32 mmol/L   Glucose, Bld 101 (H) 70 - 99 mg/dL   BUN <5 (L) 6 - 23 mg/dL   Creatinine, Ser 4.09 (L) 0.50 - 1.10 mg/dL   Calcium 9.5 8.4 - 81.1 mg/dL   Total Protein 6.3 6.0 - 8.3 g/dL   Albumin 3.0 (L) 3.5 - 5.2 g/dL   AST 16 0 - 37 U/L   ALT 11 0 - 35 U/L   Alkaline Phosphatase 86 39 - 117 U/L   Total Bilirubin 0.5 0.3 - 1.2 mg/dL   GFR calc non Af Amer >90 >90 mL/min   GFR calc Af Amer >90 >90 mL/min   Anion gap 10 5 - 15  Uric acid     Status: None   Collection Time: 10/10/14 12:30 PM  Result Value Ref Range   Uric Acid, Serum 4.2 2.4 - 7.0 mg/dL  Lactate dehydrogenase     Status: None   Collection Time: 10/10/14 12:30 PM  Result Value Ref Range   LDH 128 94 - 250 U/L    Assessment and Plan  Gestational hypertension vs preeclampsia  Reported labs and B/P's to Dr. Neale Burly Pt will be discharged and follow up in the office next week Pre E Precautions   Clemmons,Lori Grissett 10/10/2014, 12:25 PM

## 2014-10-14 ENCOUNTER — Encounter (HOSPITAL_COMMUNITY): Payer: Self-pay | Admitting: *Deleted

## 2014-10-14 ENCOUNTER — Inpatient Hospital Stay (HOSPITAL_COMMUNITY)
Admission: AD | Admit: 2014-10-14 | Discharge: 2014-10-14 | Disposition: A | Discharge status: Home / Self Care | Payer: Medicaid Other | Source: Ambulatory Visit | Attending: Obstetrics and Gynecology | Admitting: Obstetrics and Gynecology

## 2014-10-14 ENCOUNTER — Inpatient Hospital Stay (HOSPITAL_COMMUNITY): Payer: Medicaid Other

## 2014-10-14 DIAGNOSIS — O133 Gestational [pregnancy-induced] hypertension without significant proteinuria, third trimester: Secondary | ICD-10-CM | POA: Diagnosis not present

## 2014-10-14 DIAGNOSIS — F17210 Nicotine dependence, cigarettes, uncomplicated: Secondary | ICD-10-CM | POA: Diagnosis not present

## 2014-10-14 DIAGNOSIS — Z3A32 32 weeks gestation of pregnancy: Secondary | ICD-10-CM | POA: Insufficient documentation

## 2014-10-14 DIAGNOSIS — O288 Other abnormal findings on antenatal screening of mother: Secondary | ICD-10-CM

## 2014-10-14 DIAGNOSIS — O99333 Smoking (tobacco) complicating pregnancy, third trimester: Secondary | ICD-10-CM | POA: Diagnosis not present

## 2014-10-14 DIAGNOSIS — R030 Elevated blood-pressure reading, without diagnosis of hypertension: Secondary | ICD-10-CM | POA: Diagnosis present

## 2014-10-14 LAB — URINALYSIS, ROUTINE W REFLEX MICROSCOPIC
Bilirubin Urine: NEGATIVE
GLUCOSE, UA: NEGATIVE mg/dL
Hgb urine dipstick: NEGATIVE
Ketones, ur: NEGATIVE mg/dL
NITRITE: NEGATIVE
Protein, ur: NEGATIVE mg/dL
Specific Gravity, Urine: 1.01 (ref 1.005–1.030)
Urobilinogen, UA: 0.2 mg/dL (ref 0.0–1.0)
pH: 6.5 (ref 5.0–8.0)

## 2014-10-14 LAB — CBC WITH DIFFERENTIAL/PLATELET
Basophils Absolute: 0 10*3/uL (ref 0.0–0.1)
Basophils Relative: 0 % (ref 0–1)
EOS ABS: 0.3 10*3/uL (ref 0.0–0.7)
Eosinophils Relative: 2 % (ref 0–5)
HCT: 35.7 % — ABNORMAL LOW (ref 36.0–46.0)
HEMOGLOBIN: 12.7 g/dL (ref 12.0–15.0)
LYMPHS ABS: 2.1 10*3/uL (ref 0.7–4.0)
Lymphocytes Relative: 18 % (ref 12–46)
MCH: 29.6 pg (ref 26.0–34.0)
MCHC: 35.6 g/dL (ref 30.0–36.0)
MCV: 83.2 fL (ref 78.0–100.0)
Monocytes Absolute: 0.6 10*3/uL (ref 0.1–1.0)
Monocytes Relative: 5 % (ref 3–12)
NEUTROS ABS: 9 10*3/uL — AB (ref 1.7–7.7)
Neutrophils Relative %: 75 % (ref 43–77)
PLATELETS: 171 10*3/uL (ref 150–400)
RBC: 4.29 MIL/uL (ref 3.87–5.11)
RDW: 14.5 % (ref 11.5–15.5)
WBC: 11.9 10*3/uL — ABNORMAL HIGH (ref 4.0–10.5)

## 2014-10-14 LAB — PROTEIN / CREATININE RATIO, URINE
CREATININE, URINE: 131 mg/dL
Protein Creatinine Ratio: 0.21 — ABNORMAL HIGH (ref 0.00–0.15)
Total Protein, Urine: 27 mg/dL

## 2014-10-14 LAB — COMPREHENSIVE METABOLIC PANEL
ALK PHOS: 84 U/L (ref 39–117)
ALT: 10 U/L (ref 0–35)
AST: 15 U/L (ref 0–37)
Albumin: 2.9 g/dL — ABNORMAL LOW (ref 3.5–5.2)
Anion gap: 9 (ref 5–15)
BILIRUBIN TOTAL: 0.3 mg/dL (ref 0.3–1.2)
BUN: 7 mg/dL (ref 6–23)
CHLORIDE: 106 mmol/L (ref 96–112)
CO2: 23 mmol/L (ref 19–32)
Calcium: 10.3 mg/dL (ref 8.4–10.5)
Creatinine, Ser: 0.39 mg/dL — ABNORMAL LOW (ref 0.50–1.10)
GFR calc non Af Amer: >90 mL/min (ref >90)
GLUCOSE: 92 mg/dL (ref 70–99)
POTASSIUM: 3.1 mmol/L — AB (ref 3.5–5.1)
Sodium: 138 mmol/L (ref 135–145)
Total Protein: 5.9 g/dL — ABNORMAL LOW (ref 6.0–8.3)

## 2014-10-14 LAB — LACTATE DEHYDROGENASE: LDH: 127 U/L (ref 94–250)

## 2014-10-14 LAB — URINE MICROSCOPIC-ADD ON

## 2014-10-14 LAB — URIC ACID: URIC ACID, SERUM: 5.6 mg/dL (ref 2.4–7.0)

## 2014-10-14 NOTE — Progress Notes (Signed)
Baby active and pt aware 

## 2014-10-14 NOTE — MAU Provider Note (Signed)
History     CSN: [id number]  Arrival date and time: 10/14/14 1039   First Provider Initiated Contact with Patient 10/14/14 1129      Chief Complaint  Patient presents with  . Hypertension   HPI  [name] is a 24 y.o. G1P0 at 5321w0d who presents to MAU today from the office for further evaluation of elevated blood pressure. The patient was seen on Friday for the same thing and was found not to be taking Labetalol as prescribed. She states that she has taken the Labetalol as prescribed all weekend and last dose was at 0900 today. She denies headache, blurred vision, floaters, RUQ pain or peripheral edema. She denies contractions, vaginal bleeding, discharge or LOF. She reports good fetal movement.   OB History    Gravida Para Term Preterm AB TAB SAB Ectopic Multiple Living   1         0      Past Medical History  Diagnosis Date  . Gonorrhea   . Bacterial vaginosis   . Psoriasis   . Vaginal Pap smear, abnormal   . Hypertension     Past Surgical History  Procedure Laterality Date  . Tooth extraction    . No past surgeries      Family History  Problem Relation Age of Onset  . Arthritis Mother   . Depression Mother   . Hyperlipidemia Mother   . Learning disabilities Mother   . Mental illness Mother   . Varicose Veins Mother   . Alcohol abuse Father   . Arthritis Father   . Asthma Father   . Cancer Father   . Drug abuse Father   . Diabetes Father   . Early death Father   . Heart disease Father   . Learning disabilities Sister   . Hyperlipidemia Maternal Grandmother   . Hyperlipidemia Maternal Grandfather     History  Substance Use Topics  . Smoking status: Current Some Day Smoker -- 1.00 packs/day for 10 years    Types: Cigarettes  . Smokeless tobacco: Never Used  . Alcohol Use: Yes     Comment: Rare    Allergies: No Known Allergies  No prescriptions prior to admission    Review of Systems  Constitutional: Negative for fever and  malaise/fatigue.  Eyes: Negative for blurred vision.  Cardiovascular: Negative for leg swelling.  Gastrointestinal: Negative for abdominal pain.  Genitourinary:       Neg - vaginal bleeding, discharge, LOF  Neurological: Negative for headaches.   Physical Exam   Blood pressure 151/93, pulse 74, temperature 97.8 F (36.6 C), temperature source Oral, resp. rate 18, last menstrual period 03/04/2014.  Physical Exam  Constitutional: She is oriented to person, place, and time. She appears well-developed and well-nourished. No distress.  HENT:  Head: Normocephalic.  Cardiovascular: Normal rate and intact distal pulses.   Respiratory: Effort normal.  GI: Soft. She exhibits no distension and no mass. There is no tenderness. There is no rebound and no guarding.  Musculoskeletal: She exhibits no edema.  Neurological: She is alert and oriented to person, place, and time. She has normal reflexes.  No clonus  Skin: Skin is warm and dry. No erythema.  Psychiatric: She has a normal mood and affect.   Results for orders placed or performed during the hospital encounter of 10/14/14 (from the past 24 hour(s))  Urinalysis, Routine w reflex microscopic     Status: Abnormal   Collection Time: 10/14/14 11:00 AM  Result Value  Ref Range   Color, Urine YELLOW YELLOW   APPearance CLEAR CLEAR   Specific Gravity, Urine 1.010 1.005 - 1.030   pH 6.5 5.0 - 8.0   Glucose, UA NEGATIVE NEGATIVE mg/dL   Hgb urine dipstick NEGATIVE NEGATIVE   Bilirubin Urine NEGATIVE NEGATIVE   Ketones, ur NEGATIVE NEGATIVE mg/dL   Protein, ur NEGATIVE NEGATIVE mg/dL   Urobilinogen, UA 0.2 0.0 - 1.0 mg/dL   Nitrite NEGATIVE NEGATIVE   Leukocytes, UA MODERATE (A) NEGATIVE  Protein / creatinine ratio, urine     Status: Abnormal   Collection Time: 10/14/14 11:00 AM  Result Value Ref Range   Creatinine, Urine 131.00 mg/dL   Total Protein, Urine 27 mg/dL   Protein Creatinine Ratio 0.21 (H) 0.00 - 0.15  Urine microscopic-add on      Status: None   Collection Time: 10/14/14 11:00 AM  Result Value Ref Range   Squamous Epithelial / LPF RARE RARE   WBC, UA 7-10 <3 WBC/hpf   Bacteria, UA RARE RARE  CBC with Differential/Platelet     Status: Abnormal   Collection Time: 10/14/14 11:40 AM  Result Value Ref Range   WBC 11.9 (H) 4.0 - 10.5 K/uL   RBC 4.29 3.87 - 5.11 MIL/uL   Hemoglobin 12.7 12.0 - 15.0 g/dL   HCT 16.1 (L) 09.6 - 04.5 %   MCV 83.2 78.0 - 100.0 fL   MCH 29.6 26.0 - 34.0 pg   MCHC 35.6 30.0 - 36.0 g/dL   RDW 40.9 81.1 - 91.4 %   Platelets 171 150 - 400 K/uL   Neutrophils Relative % 75 43 - 77 %   Neutro Abs 9.0 (H) 1.7 - 7.7 K/uL   Lymphocytes Relative 18 12 - 46 %   Lymphs Abs 2.1 0.7 - 4.0 K/uL   Monocytes Relative 5 3 - 12 %   Monocytes Absolute 0.6 0.1 - 1.0 K/uL   Eosinophils Relative 2 0 - 5 %   Eosinophils Absolute 0.3 0.0 - 0.7 K/uL   Basophils Relative 0 0 - 1 %   Basophils Absolute 0.0 0.0 - 0.1 K/uL  Comprehensive metabolic panel     Status: Abnormal   Collection Time: 10/14/14 11:40 AM  Result Value Ref Range   Sodium 138 135 - 145 mmol/L   Potassium 3.1 (L) 3.5 - 5.1 mmol/L   Chloride 106 96 - 112 mmol/L   CO2 23 19 - 32 mmol/L   Glucose, Bld 92 70 - 99 mg/dL   BUN 7 6 - 23 mg/dL   Creatinine, Ser 7.82 (L) 0.50 - 1.10 mg/dL   Calcium 95.6 8.4 - 21.3 mg/dL   Total Protein 5.9 (L) 6.0 - 8.3 g/dL   Albumin 2.9 (L) 3.5 - 5.2 g/dL   AST 15 0 - 37 U/L   ALT 10 0 - 35 U/L   Alkaline Phosphatase 84 39 - 117 U/L   Total Bilirubin 0.3 0.3 - 1.2 mg/dL   GFR calc non Af Amer >90 >90 mL/min   GFR calc Af Amer >90 >90 mL/min   Anion gap 9 5 - 15  Uric acid     Status: None   Collection Time: 10/14/14 11:40 AM  Result Value Ref Range   Uric Acid, Serum 5.6 2.4 - 7.0 mg/dL  Lactate dehydrogenase     Status: None   Collection Time: 10/14/14 11:40 AM  Result Value Ref Range   LDH 127 94 - 250 U/L    Fetal Monitoring: Baseline: 125  bpm, moderate variability, 10 x 10 accelerations, no  decelerations Contractions: occasional, irregular   MAU Course  Procedures None  MDM CBC, CMP, Uric Acid, LDH, UA and urine protein/creatinine ratio today Discussed patient with Dr. Jackelyn Knife. Agrees with plan for BPP today. Continue Labetalol as previously prescribed. Office follow-up as scheduled or sooner PRN.   Assessment and Plan  A: SIUP at [redacted]w[redacted]d Gestational HTN  P: Discharge home Patient advised to continue Labetalol as previously prescribed Warning signs for pre-eclampsia and worsening HTN discussed Patient advised to follow-up in the office as scheduled for routine prenatal care Patient may return to MAU as needed or if her condition were to change or worsen   Marny Lowenstein, PA-C  10/14/2014, 3:53 PM

## 2014-10-14 NOTE — MAU Note (Signed)
Pt sent from MD office with hypertension, pt states she has been taking her meds since Friday.  Denies HA or visual changes.

## 2014-10-14 NOTE — Progress Notes (Signed)
Pt unaware of ctxs. 

## 2014-10-14 NOTE — MAU Note (Signed)
Pt sat up at 1151 and was not FHR variable

## 2014-10-14 NOTE — Discharge Instructions (Signed)

## 2014-10-14 NOTE — Progress Notes (Signed)
Pt's mom says pt is sometimes impatient. Pt moves a lot in bed making it difficult to monitor FHR. Extra pillow given earlier for comfort. Offered pt to turn on side but states "it will just be the same"

## 2014-10-14 NOTE — MAU Note (Signed)
To us ambulatory

## 2014-10-14 NOTE — Progress Notes (Signed)
Julie Wenzel PA in to discuss test results and d/c plan. Written and verbal d/c instructions given and understanding voiced 

## 2014-10-15 NOTE — Progress Notes (Signed)
FHT from 3-15 reviewed.  Tracing had normal baseline, moderate variability, no significant decels but no true accels either.  BPP 8/8.

## 2014-10-17 ENCOUNTER — Inpatient Hospital Stay (HOSPITAL_COMMUNITY): Payer: Medicaid Other

## 2014-10-17 ENCOUNTER — Encounter (HOSPITAL_COMMUNITY): Payer: Self-pay | Admitting: General Practice

## 2014-10-17 ENCOUNTER — Observation Stay (HOSPITAL_COMMUNITY)
Admission: AD | Admit: 2014-10-17 | Discharge: 2014-10-18 | Disposition: A | Discharge status: Home / Self Care | Payer: Medicaid Other | Source: Ambulatory Visit | Attending: Obstetrics and Gynecology | Admitting: Obstetrics and Gynecology

## 2014-10-17 DIAGNOSIS — O99333 Smoking (tobacco) complicating pregnancy, third trimester: Secondary | ICD-10-CM | POA: Diagnosis not present

## 2014-10-17 DIAGNOSIS — F17219 Nicotine dependence, cigarettes, with unspecified nicotine-induced disorders: Secondary | ICD-10-CM | POA: Insufficient documentation

## 2014-10-17 DIAGNOSIS — O139 Gestational [pregnancy-induced] hypertension without significant proteinuria, unspecified trimester: Secondary | ICD-10-CM

## 2014-10-17 DIAGNOSIS — O24113 Pre-existing diabetes mellitus, type 2, in pregnancy, third trimester: Secondary | ICD-10-CM | POA: Diagnosis not present

## 2014-10-17 DIAGNOSIS — Z8619 Personal history of other infectious and parasitic diseases: Secondary | ICD-10-CM | POA: Diagnosis not present

## 2014-10-17 DIAGNOSIS — Z3A32 32 weeks gestation of pregnancy: Secondary | ICD-10-CM | POA: Insufficient documentation

## 2014-10-17 DIAGNOSIS — O163 Unspecified maternal hypertension, third trimester: Secondary | ICD-10-CM | POA: Diagnosis present

## 2014-10-17 DIAGNOSIS — Z872 Personal history of diseases of the skin and subcutaneous tissue: Secondary | ICD-10-CM | POA: Insufficient documentation

## 2014-10-17 DIAGNOSIS — O133 Gestational [pregnancy-induced] hypertension without significant proteinuria, third trimester: Principal | ICD-10-CM | POA: Insufficient documentation

## 2014-10-17 LAB — URINE MICROSCOPIC-ADD ON

## 2014-10-17 LAB — URINALYSIS, ROUTINE W REFLEX MICROSCOPIC
Bilirubin Urine: NEGATIVE
Glucose, UA: NEGATIVE mg/dL
Hgb urine dipstick: NEGATIVE
Ketones, ur: NEGATIVE mg/dL
Nitrite: NEGATIVE
PH: 7.5 (ref 5.0–8.0)
Protein, ur: 30 mg/dL — AB
SPECIFIC GRAVITY, URINE: 1.02 (ref 1.005–1.030)
Urobilinogen, UA: 0.2 mg/dL (ref 0.0–1.0)

## 2014-10-17 LAB — COMPREHENSIVE METABOLIC PANEL
ALBUMIN: 2.8 g/dL — AB (ref 3.5–5.2)
ALK PHOS: 78 U/L (ref 39–117)
ALT: 11 U/L (ref 0–35)
AST: 16 U/L (ref 0–37)
Anion gap: 8 (ref 5–15)
BUN: 7 mg/dL (ref 6–23)
CALCIUM: 9.6 mg/dL (ref 8.4–10.5)
CO2: 26 mmol/L (ref 19–32)
Chloride: 103 mmol/L (ref 96–112)
Creatinine, Ser: 0.44 mg/dL — ABNORMAL LOW (ref 0.50–1.10)
GFR calc Af Amer: >90 mL/min (ref >90)
GFR calc non Af Amer: >90 mL/min (ref >90)
Glucose, Bld: 99 mg/dL (ref 70–99)
POTASSIUM: 3.1 mmol/L — AB (ref 3.5–5.1)
SODIUM: 137 mmol/L (ref 135–145)
Total Bilirubin: 0.3 mg/dL (ref 0.3–1.2)
Total Protein: 5.7 g/dL — ABNORMAL LOW (ref 6.0–8.3)

## 2014-10-17 LAB — PROTEIN / CREATININE RATIO, URINE
Creatinine, Urine: 257 mg/dL
Protein Creatinine Ratio: 0.29 — ABNORMAL HIGH (ref 0.00–0.15)
TOTAL PROTEIN, URINE: 74 mg/dL

## 2014-10-17 LAB — CBC
HEMATOCRIT: 35.4 % — AB (ref 36.0–46.0)
Hemoglobin: 12.5 g/dL (ref 12.0–15.0)
MCH: 29.2 pg (ref 26.0–34.0)
MCHC: 35.3 g/dL (ref 30.0–36.0)
MCV: 82.7 fL (ref 78.0–100.0)
PLATELETS: 155 10*3/uL (ref 150–400)
RBC: 4.28 MIL/uL (ref 3.87–5.11)
RDW: 14.1 % (ref 11.5–15.5)
WBC: 11.4 10*3/uL — ABNORMAL HIGH (ref 4.0–10.5)

## 2014-10-17 LAB — URIC ACID: Uric Acid, Serum: 6.4 mg/dL (ref 2.4–7.0)

## 2014-10-17 LAB — LACTATE DEHYDROGENASE: LDH: 148 U/L (ref 94–250)

## 2014-10-17 MED ORDER — LABETALOL HCL 100 MG PO TABS
200.0000 mg | ORAL_TABLET | Freq: Two times a day (BID) | ORAL | Status: DC
  Administered 2014-10-17 – 2014-10-18 (×2): 200 mg via ORAL
  Filled 2014-10-17 (×2): qty 2

## 2014-10-17 MED ORDER — ACETAMINOPHEN 325 MG PO TABS: 650.0000 mg | ORAL_TABLET | ORAL | Status: DC | PRN

## 2014-10-17 MED ORDER — POTASSIUM CHLORIDE CRYS ER 10 MEQ PO TBCR
10.0000 meq | EXTENDED_RELEASE_TABLET | Freq: Three times a day (TID) | ORAL | Status: DC
  Administered 2014-10-17 – 2014-10-18 (×3): 10 meq via ORAL
  Filled 2014-10-17 (×6): qty 1

## 2014-10-17 MED ORDER — LABETALOL HCL 100 MG PO TABS
100.0000 mg | ORAL_TABLET | Freq: Once | ORAL | Status: AC
  Administered 2014-10-17: 100 mg via ORAL
  Filled 2014-10-17: qty 1

## 2014-10-17 MED ORDER — PRENATAL MULTIVITAMIN CH
1.0000 | ORAL_TABLET | Freq: Every day | ORAL | Status: DC
  Administered 2014-10-17: 1 via ORAL
  Filled 2014-10-17: qty 1

## 2014-10-17 MED ORDER — CALCIUM CARBONATE ANTACID 500 MG PO CHEW
2.0000 | CHEWABLE_TABLET | ORAL | Status: DC | PRN
  Administered 2014-10-17: 400 mg via ORAL
  Filled 2014-10-17 (×2): qty 1
  Filled 2014-10-17: qty 2

## 2014-10-17 MED ORDER — BETAMETHASONE SOD PHOS & ACET 6 (3-3) MG/ML IJ SUSP
12.0000 mg | INTRAMUSCULAR | Status: AC
  Administered 2014-10-17 – 2014-10-18 (×2): 12 mg via INTRAMUSCULAR
  Filled 2014-10-17 (×2): qty 2

## 2014-10-17 MED ORDER — ZOLPIDEM TARTRATE 5 MG PO TABS: 5.0000 mg | ORAL_TABLET | Freq: Every evening | ORAL | Status: DC | PRN

## 2014-10-17 MED ORDER — DOCUSATE SODIUM 100 MG PO CAPS
100.0000 mg | ORAL_CAPSULE | Freq: Every day | ORAL | Status: DC
  Administered 2014-10-17 – 2014-10-18 (×2): 100 mg via ORAL
  Filled 2014-10-17 (×3): qty 1

## 2014-10-17 NOTE — MAU Provider Note (Signed)
Chief Complaint:  Hypertension   None     HPI: Lauren Sharp is a 24 y.o. G1P0 at 8232w3dwho presents to maternity admissions sent from the office for elevated BP. She has hx of borderline HTN prior to pregnancy and borderline during this pregnancy per the pt. She was started on labetalol a few weeks ago and reports she is taking it as prescribed.  She denies h/a, epigastric pain, or visual disturbances.  She does report heartburn today.   She reports good fetal movement, denies regular contractions, LOF, vaginal bleeding, vaginal itching/burning, urinary symptoms, dizziness, n/v, or fever/chills.    Past Medical History: Past Medical History  Diagnosis Date  . Gonorrhea   . Bacterial vaginosis   . Psoriasis   . Vaginal Pap smear, abnormal   . Hypertension     Past obstetric history: OB History  Gravida Para Term Preterm AB SAB TAB Ectopic Multiple Living  1         0    # Outcome Date GA Lbr Len/2nd Weight Sex Delivery Anes PTL Lv  1 Current               Past Surgical History: Past Surgical History  Procedure Laterality Date  . Tooth extraction    . No past surgeries      Family History: Family History  Problem Relation Age of Onset  . Arthritis Mother   . Depression Mother   . Hyperlipidemia Mother   . Learning disabilities Mother   . Mental illness Mother   . Varicose Veins Mother   . Alcohol abuse Father   . Arthritis Father   . Asthma Father   . Cancer Father   . Drug abuse Father   . Diabetes Father   . Early death Father   . Heart disease Father   . Learning disabilities Sister   . Hyperlipidemia Maternal Grandmother   . Hyperlipidemia Maternal Grandfather     Social History: History  Substance Use Topics  . Smoking status: Current Some Day Smoker -- 1.00 packs/day for 10 years    Types: Cigarettes  . Smokeless tobacco: Never Used  . Alcohol Use: Yes     Comment: Rare    Allergies: No Known Allergies  Meds:  Prescriptions prior to admission   Medication Sig Dispense Refill Last Dose  . calcium carbonate (TUMS - DOSED IN MG ELEMENTAL CALCIUM) 500 MG chewable tablet Chew 1 tablet by mouth as needed for heartburn.    10/17/2014 at Unknown time  . potassium chloride (K-DUR,KLOR-CON) 10 MEQ tablet Take 1 tablet (10 mEq total) by mouth 3 (three) times daily. 30 tablet 2 10/17/2014 at Unknown time  . Prenatal Vit-Fe Fumarate-FA (PRENATAL MULTIVITAMIN) TABS tablet Take 1 tablet by mouth daily at 12 noon.   10/17/2014 at Unknown time  . [DISCONTINUED] labetalol (NORMODYNE) 100 MG tablet Take 100 mg by mouth 2 (two) times daily.   10/17/2014 at 9:00am    ROS: Pertinent findings in history of present illness.  Physical Exam  Blood pressure 144/65, pulse 81, temperature 98.8 F (37.1 C), temperature source Oral, resp. rate 18, height 5\' 4"  (1.626 m), weight 99.791 kg (220 lb), last menstrual period 03/04/2014.  Patient Vitals for the past 24 hrs:  BP Temp Temp src Pulse Resp Height Weight  10/17/14 1214 144/65 mmHg - - - - - -  10/17/14 1117 (!) 169/101 mmHg - - 81 - - -  10/17/14 1038 (!) 158/105 mmHg - - 84 - - -  10/17/14 1015 (!) 176/110 mmHg 98.8 F (37.1 C) Oral 92 18  (1.626 m) 99.791 kg (220 lb)   GENERAL: Well-developed, well-nourished female in no acute distress.  HEENT: normocephalic HEART: normal rate RESP: normal effort ABDOMEN: Soft, non-tender, gravid appropriate for gestational age EXTREMITIES: Nontender, no edema NEURO: alert and oriented Pelvic exam: Cervix pink, visually closed, without lesion, scant white creamy discharge, vaginal walls and external genitalia normal     FHT:  Baseline 135, moderate variability, accelerations present, no decelerations Contractions: None on toco or to palpation   Labs: Results for orders placed or performed during the hospital encounter of 10/17/14 (from the past 24 hour(s))  Urinalysis, Routine w reflex microscopic     Status: Abnormal   Collection Time: 10/17/14 10:20 AM   Result Value Ref Range   Color, Urine YELLOW YELLOW   APPearance HAZY (A) CLEAR   Specific Gravity, Urine 1.020 1.005 - 1.030   pH 7.5 5.0 - 8.0   Glucose, UA NEGATIVE NEGATIVE mg/dL   Hgb urine dipstick NEGATIVE NEGATIVE   Bilirubin Urine NEGATIVE NEGATIVE   Ketones, ur NEGATIVE NEGATIVE mg/dL   Protein, ur 30 (A) NEGATIVE mg/dL   Urobilinogen, UA 0.2 0.0 - 1.0 mg/dL   Nitrite NEGATIVE NEGATIVE   Leukocytes, UA SMALL (A) NEGATIVE  Urine microscopic-add on     Status: Abnormal   Collection Time: 10/17/14 10:20 AM  Result Value Ref Range   Squamous Epithelial / LPF MANY (A) RARE   WBC, UA 3-6 <3 WBC/hpf   RBC / HPF 0-2 <3 RBC/hpf   Bacteria, UA FEW (A) RARE   Urine-Other MUCOUS PRESENT   CBC     Status: Abnormal   Collection Time: 10/17/14 10:40 AM  Result Value Ref Range   WBC 11.4 (H) 4.0 - 10.5 K/uL   RBC 4.28 3.87 - 5.11 MIL/uL   Hemoglobin 12.5 12.0 - 15.0 g/dL   HCT 16.1 (L) 09.6 - 04.5 %   MCV 82.7 78.0 - 100.0 fL   MCH 29.2 26.0 - 34.0 pg   MCHC 35.3 30.0 - 36.0 g/dL   RDW 40.9 81.1 - 91.4 %   Platelets 155 150 - 400 K/uL  Comprehensive metabolic panel     Status: Abnormal   Collection Time: 10/17/14 10:40 AM  Result Value Ref Range   Sodium 137 135 - 145 mmol/L   Potassium 3.1 (L) 3.5 - 5.1 mmol/L   Chloride 103 96 - 112 mmol/L   CO2 26 19 - 32 mmol/L   Glucose, Bld 99 70 - 99 mg/dL   BUN 7 6 - 23 mg/dL   Creatinine, Ser 7.82 (L) 0.50 - 1.10 mg/dL   Calcium 9.6 8.4 - 95.6 mg/dL   Total Protein 5.7 (L) 6.0 - 8.3 g/dL   Albumin 2.8 (L) 3.5 - 5.2 g/dL   AST 16 0 - 37 U/L   ALT 11 0 - 35 U/L   Alkaline Phosphatase 78 39 - 117 U/L   Total Bilirubin 0.3 0.3 - 1.2 mg/dL   GFR calc non Af Amer >90 >90 mL/min   GFR calc Af Amer >90 >90 mL/min   Anion gap 8 5 - 15  Uric acid     Status: None   Collection Time: 10/17/14 10:40 AM  Result Value Ref Range   Uric Acid, Serum 6.4 2.4 - 7.0 mg/dL  Lactate dehydrogenase     Status: None   Collection Time: 10/17/14  10:40 AM  Result Value Ref Range  LDH 148 94 - 250 U/L   Assessment: 1. Gestational hypertension w/o significant proteinuria in 3rd trimester   2. Hypertension affecting pregnancy in third trimester     Plan: Consult Dr Senaida Ores Labetalol 100 mg PO x 1 dose given in MAU Admit for observation to antepartum 24 hour urine Second course of BMZ for fetal lung maturity    Medication List    ASK your doctor about these medications        calcium carbonate 500 MG chewable tablet  Commonly known as:  TUMS - dosed in mg elemental calcium  Chew 1 tablet by mouth as needed for heartburn.     potassium chloride 10 MEQ tablet  Commonly known as:  K-DUR,KLOR-CON  Take 1 tablet (10 mEq total) by mouth 3 (three) times daily.     prenatal multivitamin Tabs tablet  Take 1 tablet by mouth daily at 12 noon.        Sharen Counter Certified Nurse-Midwife 10/17/2014 12:54 PM

## 2014-10-17 NOTE — H&P (Signed)
Lauren Sharp is a 24 y.o. female G1P0 ( EDD 12/07/2014 by 9 week US) presenting for observation given worsening BP over last week).  Pt with probable borderline CHTN, baseline BP in first trimester 120/70-80,  but began to elevate more around 29 weeks.  Admitted then with labs and 24 hour protein WNL and received steroids.  She has been followed closely outpatient since that time, with labs and NST's.  She had been stable on labetalol 100mg  po BID until recently and when presenting for NST, BP markedly elevated to 180/120 in office.  Sent to MAU with BP improved at rest to 160-150/100-105.  She denies preeclamptic symptoms.  Labs are WNL and NST reactive.  She had slight proteinuria today.  No recent US for growth.  Maternal Medical History:  Contractions: Frequency: rare.   Perceived severity is mild.    Fetal activity: Perceived fetal activity is normal.    Prenatal complications: PIH.   Prenatal Complications - Diabetes: none.    OB History    Gravida Para Term Preterm AB TAB SAB Ectopic Multiple Living   1         0     Past Medical History  Diagnosis Date  . Gonorrhea   . Bacterial vaginosis   . Psoriasis   . Vaginal Pap smear, abnormal   . Hypertension    Past Surgical History  Procedure Laterality Date  . Tooth extraction    . No past surgeries     Family History: family history includes Alcohol abuse in her father; Arthritis in her father and mother; Asthma in her father; Cancer in her father; Depression in her mother; Diabetes in her father; Drug abuse in her father; Early death in her father; Heart disease in her father; Hyperlipidemia in her maternal grandfather, maternal grandmother, and mother; Learning disabilities in her mother and sister; Mental illness in her mother; Varicose Veins in her mother. Social History:  reports that she has been smoking Cigarettes.  She has a 10 pack-year smoking history. She has never used smokeless tobacco. She reports that she drinks  alcohol. She reports that she does not use illicit drugs.   Prenatal Transfer Tool  Maternal Diabetes: No Genetic Screening: Declined Maternal Ultrasounds/Referrals: Normal Fetal Ultrasounds or other Referrals:  None Maternal Substance Abuse:  Yes:  Type: Smoker Significant Maternal Medications:  Meds include: Other:   labetalol Significant Maternal Lab Results:  None Other Comments:  None  Review of Systems  Gastrointestinal: Negative for abdominal pain.  Neurological: Negative for headaches.      Blood pressure 144/65, pulse 81, temperature 98.8 F (37.1 C), temperature source Oral, resp. rate 18, height 5\' 4"  (1.626 m), weight 99.791 kg (220 lb), last menstrual period 03/04/2014. Maternal Exam:  Uterine Assessment: Contraction strength is mild.  Contraction frequency is rare.   Abdomen: Patient reports no abdominal tenderness. Introitus: Normal vulva. Normal vagina.    Physical Exam  Constitutional: She appears well-developed.  Cardiovascular: Normal rate and regular rhythm.   Respiratory: Effort normal and breath sounds normal.  GI: Soft.  Genitourinary: Vagina normal and uterus normal.  Neurological: She is alert.  Psychiatric: She has a normal mood and affect.    Prenatal labs: ABO, Rh: --/--/O NEG (02/25 1545) Antibody: POS (02/25 1545) Rubella: Immune (10/08 0000) RPR: Nonreactive (02/11 0000)  HBsAg: Negative (10/08 0000)  HIV: Non-reactive (02/11 0000)  GBS:   not yeet done One hour GTT 158 Three hour GTT 84/167/181/121  Assessment/Plan: Pt admitted to 24  hour observation and repeat 24 hour urine.  Will increase labetalol to 200 mg po BID and see if will hold BP better.   Oliver Pila 10/17/2014, 12:36 PM

## 2014-10-17 NOTE — MAU Note (Signed)
Sent from office for pre-eclampsia eval.  Pt states "feels like crap, just yucky", no headache, denies visual changes, swelling or epigastric pain.

## 2014-10-17 NOTE — Progress Notes (Signed)
Patient ID: Lauren LynnRikki Sharp, female   DOB: 02/14/1991, 24 y.o.   MRN: [id number] Pt feeling well overall.  No HA or other sx BP improved with increase in labetalol to 140-160/80-90's FHR category 1 US done but report not available yet Labs WNL and 24 hour urine in progress.  Will repeat labs in AM and see if BP stays stable on increased meds. May d/c home after 24 hour urine finished if BP improved  Repeating betamethasone

## 2014-10-17 NOTE — Plan of Care (Signed)
Problem: Phase II Progression Outcomes Goal: Electronic fetal monitoring(Doppler,Continuous,Intermittent) EFM (Doppler, Continuous, Intermittent)  Outcome: Progressing Monitoring ordered TID.

## 2014-10-18 ENCOUNTER — Observation Stay (HOSPITAL_COMMUNITY): Payer: Medicaid Other

## 2014-10-18 LAB — COMPREHENSIVE METABOLIC PANEL
ALT: 11 U/L (ref 0–35)
ANION GAP: 9 (ref 5–15)
AST: 16 U/L (ref 0–37)
Albumin: 2.7 g/dL — ABNORMAL LOW (ref 3.5–5.2)
Alkaline Phosphatase: 78 U/L (ref 39–117)
BUN: 9 mg/dL (ref 6–23)
CALCIUM: 9.6 mg/dL (ref 8.4–10.5)
CO2: 24 mmol/L (ref 19–32)
Chloride: 103 mmol/L (ref 96–112)
Creatinine, Ser: 0.4 mg/dL — ABNORMAL LOW (ref 0.50–1.10)
GFR calc Af Amer: >90 mL/min (ref >90)
GLUCOSE: 115 mg/dL — AB (ref 70–99)
POTASSIUM: 3.4 mmol/L — AB (ref 3.5–5.1)
Sodium: 136 mmol/L (ref 135–145)
Total Bilirubin: 0.3 mg/dL (ref 0.3–1.2)
Total Protein: 5.3 g/dL — ABNORMAL LOW (ref 6.0–8.3)

## 2014-10-18 LAB — CBC
HCT: 34.4 % — ABNORMAL LOW (ref 36.0–46.0)
HEMOGLOBIN: 12.1 g/dL (ref 12.0–15.0)
MCH: 29.2 pg (ref 26.0–34.0)
MCHC: 35.2 g/dL (ref 30.0–36.0)
MCV: 83.1 fL (ref 78.0–100.0)
Platelets: 158 10*3/uL (ref 150–400)
RBC: 4.14 MIL/uL (ref 3.87–5.11)
RDW: 14.1 % (ref 11.5–15.5)
WBC: 11.4 10*3/uL — ABNORMAL HIGH (ref 4.0–10.5)

## 2014-10-18 MED ORDER — LABETALOL HCL 200 MG PO TABS: 200.0000 mg | ORAL_TABLET | Freq: Two times a day (BID) | ORAL | Status: DC

## 2014-10-18 NOTE — Progress Notes (Signed)
Discharge teaching completed with verbal teach back. Patient discharged with s/o at side. Denies any c/o. Will follow up with provider as scheduled and return to hospital as needed.  Windle Guardammy Issam Carlyon MSN, RNC

## 2014-10-18 NOTE — Discharge Summary (Signed)
Physician Discharge Summary  Patient ID: Lauren Sharp MRN: [id number] DOB/AGE: 24/04/1991 23 y.o.  Admit date: 10/17/2014 Discharge date: 10/18/2014  Admission Diagnoses: Pregnancy induced hypertension                                        Preterm pregnancy at 32 4/7 weeks  Discharge Diagnoses: same  Active Problems:   Hypertension affecting pregnancy in third trimester   Discharged Condition: stable  Hospital Course: Pt admitted to observation because of BP markedly elevated when presented for office NST.  BP in office 160-180/110-122.  On arrival to hospital BP improved but still higher than acceptable.  170/105.  Preeclampsia labs were normal.  FHR Category 1.  She completed a 24 hour urine prior to d/c but the results can take hours to days to return, so we will d/c her and f/u those levels. US for measurements will be done prior to d/c.     Significant Diagnostic Studies: labs: CBC, CMP, US  Treatments:Labetalol  Discharge Exam: Blood pressure 151/82, pulse 63, temperature 98.3 F (36.8 C), temperature source Oral, resp. rate 20, height 5\' 4"  (1.626 m), weight 220 lb (99.791 kg), last menstrual period 03/04/2014, SpO2 98 %. General appearance: alert and cooperative  Abdomen soft, gravid and T  Disposition: 01-Home or Self Care      Discharge Instructions    Diet - low sodium heart healthy    Complete by:  As directed      Discharge instructions    Complete by:  As directed   Call with severe HA or abdominal pain.  Call if decreased fetal movement.     Increase activity slowly    Complete by:  As directed      Other Restrictions    Complete by:  As directed   Pt advised no work and to limit activity.            Medication List    TAKE these medications        calcium carbonate 500 MG chewable tablet  Commonly known as:  TUMS - dosed in mg elemental calcium  Chew 1 tablet by mouth as needed for heartburn.     labetalol 200 MG tablet  Commonly known as:   NORMODYNE  Take 1 tablet (200 mg total) by mouth 2 (two) times daily.     potassium chloride 10 MEQ tablet  Commonly known as:  K-DUR,KLOR-CON  Take 1 tablet (10 mEq total) by mouth 3 (three) times daily.     prenatal multivitamin Tabs tablet  Take 1 tablet by mouth daily at 12 noon.       Follow-up Information    Follow up with Bing PlumeHENLEY,THOMAS F, MD.   Specialty:  Obstetrics and Gynecology   Why:  as scheduled 10/22/14    Contact information:   [address], [address] [place] [id number] [phone]       Signed: Oliver PilaRICHARDSON,Elzina Devera W 10/18/2014, 10:21 AM

## 2014-10-18 NOTE — Progress Notes (Signed)
Acknowledged MD order for social work consult regarding mother having difficulty with her food stamp application because she has not been able to get proof of salary from her employer.  Met with mother who was very pleasant and receptive to CSW.  She is currently residing in her mother's home.  FOB Daisy Floro is reportedly employed and supportive.   Mother states that she works at [employer] and is having difficulty getting a copy of her paystub.  Informed that she will not be returning to work until after the pregnancy.  Discussed possible options to obtain proof of income, and also recommended she update her food stamp application to reflect that she currently has no income.   No other concerns related at this time.

## 2014-10-18 NOTE — Progress Notes (Signed)
Patient ID: Lauren Sharp, female   DOB: 12/28/1990, 24 y.o.   MRN: [id number] Pt slept well, no PIH symptoms BP 150-170/80-99 on increased labetalol to 200mg  po BID Labs WNL this AM 24 hour urine will be done around 1700 FHR category 1 on intermittent monitoring  Pt will receive 2nd betamethasone today BP improved on 200mg  po labetalol US yesterday did not due measurements, will get those today  D/w pt options of outpatient management since BP improved and she would like this. Will stay and complete 24 hour urine and d/c this pm Continue labetalol 200mg  po BID outpatient and oral potassium Has appointment 3/23 for NST and visit

## 2014-10-18 NOTE — Discharge Instructions (Signed)

## 2014-10-18 NOTE — Progress Notes (Signed)
UR completed 

## 2014-10-19 ENCOUNTER — Inpatient Hospital Stay (HOSPITAL_COMMUNITY)
Admission: AD | Admit: 2014-10-19 | Discharge: 2014-10-31 | DRG: 981 | Disposition: A | Discharge status: Home / Self Care | Payer: Medicaid Other | Source: Ambulatory Visit | Attending: Internal Medicine | Admitting: Internal Medicine

## 2014-10-19 DIAGNOSIS — Z3A01 Less than 8 weeks gestation of pregnancy: Secondary | ICD-10-CM

## 2014-10-19 DIAGNOSIS — O2343 Unspecified infection of urinary tract in pregnancy, third trimester: Secondary | ICD-10-CM | POA: Diagnosis present

## 2014-10-19 DIAGNOSIS — J9601 Acute respiratory failure with hypoxia: Secondary | ICD-10-CM | POA: Diagnosis present

## 2014-10-19 DIAGNOSIS — Z98891 History of uterine scar from previous surgery: Secondary | ICD-10-CM

## 2014-10-19 DIAGNOSIS — I2699 Other pulmonary embolism without acute cor pulmonale: Secondary | ICD-10-CM

## 2014-10-19 DIAGNOSIS — F17210 Nicotine dependence, cigarettes, uncomplicated: Secondary | ICD-10-CM | POA: Diagnosis present

## 2014-10-19 DIAGNOSIS — I5021 Acute systolic (congestive) heart failure: Secondary | ICD-10-CM | POA: Diagnosis present

## 2014-10-19 DIAGNOSIS — E872 Acidosis: Secondary | ICD-10-CM | POA: Diagnosis present

## 2014-10-19 DIAGNOSIS — F419 Anxiety disorder, unspecified: Secondary | ICD-10-CM | POA: Diagnosis present

## 2014-10-19 DIAGNOSIS — O903 Peripartum cardiomyopathy: Secondary | ICD-10-CM | POA: Diagnosis present

## 2014-10-19 DIAGNOSIS — B9620 Unspecified Escherichia coli [E. coli] as the cause of diseases classified elsewhere: Secondary | ICD-10-CM | POA: Diagnosis present

## 2014-10-19 DIAGNOSIS — N390 Urinary tract infection, site not specified: Secondary | ICD-10-CM | POA: Diagnosis not present

## 2014-10-19 DIAGNOSIS — R0602 Shortness of breath: Secondary | ICD-10-CM

## 2014-10-19 DIAGNOSIS — Z8249 Family history of ischemic heart disease and other diseases of the circulatory system: Secondary | ICD-10-CM

## 2014-10-19 DIAGNOSIS — O99213 Obesity complicating pregnancy, third trimester: Secondary | ICD-10-CM | POA: Diagnosis present

## 2014-10-19 DIAGNOSIS — Z7951 Long term (current) use of inhaled steroids: Secondary | ICD-10-CM

## 2014-10-19 DIAGNOSIS — O321XX0 Maternal care for breech presentation, not applicable or unspecified: Secondary | ICD-10-CM | POA: Diagnosis present

## 2014-10-19 DIAGNOSIS — O99019 Anemia complicating pregnancy, unspecified trimester: Secondary | ICD-10-CM | POA: Diagnosis present

## 2014-10-19 DIAGNOSIS — Z3A33 33 weeks gestation of pregnancy: Secondary | ICD-10-CM | POA: Diagnosis present

## 2014-10-19 DIAGNOSIS — O99334 Smoking (tobacco) complicating childbirth: Secondary | ICD-10-CM | POA: Diagnosis present

## 2014-10-19 DIAGNOSIS — J9690 Respiratory failure, unspecified, unspecified whether with hypoxia or hypercapnia: Secondary | ICD-10-CM

## 2014-10-19 DIAGNOSIS — Z79899 Other long term (current) drug therapy: Secondary | ICD-10-CM

## 2014-10-19 DIAGNOSIS — T380X5A Adverse effect of glucocorticoids and synthetic analogues, initial encounter: Secondary | ICD-10-CM | POA: Diagnosis not present

## 2014-10-19 DIAGNOSIS — R0603 Acute respiratory distress: Secondary | ICD-10-CM

## 2014-10-19 DIAGNOSIS — R0600 Dyspnea, unspecified: Secondary | ICD-10-CM

## 2014-10-19 DIAGNOSIS — O113 Pre-existing hypertension with pre-eclampsia, third trimester: Secondary | ICD-10-CM | POA: Diagnosis present

## 2014-10-19 DIAGNOSIS — O119 Pre-existing hypertension with pre-eclampsia, unspecified trimester: Secondary | ICD-10-CM | POA: Diagnosis present

## 2014-10-19 LAB — PROTEIN, URINE, 24 HOUR
COLLECTION INTERVAL-UPROT: 24 h
PROTEIN, URINE: 22 mg/dL (ref 5–24)
Protein, 24H Urine: 457 mg/d — ABNORMAL HIGH (ref <150)
Urine Total Volume-UPROT: 2075 mL

## 2014-10-19 LAB — TYPE AND SCREEN
ABO/RH(D): O NEG
Antibody Screen: POSITIVE
DAT, IGG: NEGATIVE
UNIT DIVISION: 0
Unit division: 0

## 2014-10-19 MED ORDER — LABETALOL HCL 5 MG/ML IV SOLN
20.0000 mg | INTRAVENOUS | Status: AC | PRN
  Administered 2014-10-19: 20 mg via INTRAVENOUS
  Administered 2014-10-20: 40 mg via INTRAVENOUS
  Administered 2014-10-20: 80 mg via INTRAVENOUS
  Filled 2014-10-19: qty 8
  Filled 2014-10-19: qty 4
  Filled 2014-10-19: qty 16

## 2014-10-19 MED ORDER — LACTATED RINGERS IV SOLN
INTRAVENOUS | Status: DC
  Administered 2014-10-19 – 2014-10-20 (×2): via INTRAVENOUS
  Administered 2014-10-20: 100 mL/h via INTRAVENOUS
  Administered 2014-10-21: 16:00:00 via INTRAVENOUS

## 2014-10-19 MED ORDER — HYDRALAZINE HCL 20 MG/ML IJ SOLN: 10.0000 mg | Freq: Once | INTRAMUSCULAR | Status: AC | PRN

## 2014-10-19 NOTE — MAU Provider Note (Signed)
History     CSN: [id number]  Arrival date and time: 10/19/14 2309   First Provider Initiated Contact with Patient 10/19/14 2346      Chief Complaint  Patient presents with  . Headache   HPI  [name] is a 24 y.o. G1P0 at 4289w5d here with report of headache that increased with intensity at 1700.  Headache is rated an 8/10.  Denies epigastric pain.  +blurry vision.  Recently discharged home for similar symptoms - 24 hr urine was pending.  Denies contractions.   Past Medical History  Diagnosis Date  . Gonorrhea   . Bacterial vaginosis   . Psoriasis   . Vaginal Pap smear, abnormal   . Hypertension     Past Surgical History  Procedure Laterality Date  . Tooth extraction    . No past surgeries      Family History  Problem Relation Age of Onset  . Arthritis Mother   . Depression Mother   . Hyperlipidemia Mother   . Learning disabilities Mother   . Mental illness Mother   . Varicose Veins Mother   . Alcohol abuse Father   . Arthritis Father   . Asthma Father   . Cancer Father   . Drug abuse Father   . Diabetes Father   . Early death Father   . Heart disease Father   . Learning disabilities Sister   . Hyperlipidemia Maternal Grandmother   . Hyperlipidemia Maternal Grandfather     History  Substance Use Topics  . Smoking status: Current Some Day Smoker -- 1.00 packs/day for 10 years    Types: Cigarettes  . Smokeless tobacco: Never Used  . Alcohol Use: Yes     Comment: Rare    Allergies: No Known Allergies  Prescriptions prior to admission  Medication Sig Dispense Refill Last Dose  . calcium carbonate (TUMS - DOSED IN MG ELEMENTAL CALCIUM) 500 MG chewable tablet Chew 1 tablet by mouth as needed for heartburn.    10/17/2014 at Unknown time  . labetalol (NORMODYNE) 200 MG tablet Take 1 tablet (200 mg total) by mouth 2 (two) times daily. 60 tablet 1   . potassium chloride (K-DUR,KLOR-CON) 10 MEQ tablet Take 1 tablet (10 mEq total) by mouth 3 (three) times  daily. 30 tablet 2 10/17/2014 at Unknown time  . Prenatal Vit-Fe Fumarate-FA (PRENATAL MULTIVITAMIN) TABS tablet Take 1 tablet by mouth daily at 12 noon.   10/17/2014 at Unknown time    Review of Systems  Eyes: Positive for blurred vision. Negative for double vision and photophobia.  Cardiovascular: Negative for chest pain and palpitations.  Gastrointestinal: Negative for abdominal pain.  Neurological: Positive for headaches.  All other systems reviewed and are negative.  Physical Exam   Blood pressure 186/118, pulse 76, temperature 98.4 F (36.9 C), temperature source Oral, resp. rate 19, height 5\' 4"  (1.626 m), weight 99.791 kg (220 lb), last menstrual period 03/04/2014, SpO2 96 %.  Physical Exam  Constitutional: She is oriented to person, place, and time. She appears well-developed and well-nourished. No distress.  HENT:  Head: Normocephalic.  Eyes: Pupils are equal, round, and reactive to light.  Neck: Normal range of motion. Neck supple.  Cardiovascular: Normal rate, regular rhythm and normal heart sounds.   Respiratory: Effort normal and breath sounds normal. No respiratory distress.  GI: Soft. There is no tenderness.  Genitourinary: No bleeding in the vagina.  Musculoskeletal: Normal range of motion. She exhibits edema (1+ bilat pedal edema).  Neurological: She  is alert and oriented to person, place, and time. She has normal reflexes. She displays normal reflexes.  Neg clonus  Skin: Skin is warm and dry.    MAU Course  Procedures 24 hr urine protein (3/18):  457  2355 Consulted with Dr. Senaida Ores > reviewed HPI/exam/blood pressures/24 hr urine result > admit to antenatal > initiate preeclampsia order set, see how blood pressure responds to medication, may give magnesium sulfate if labs elevated   Assessment and Plan  24 y.o. G1P0 at [redacted]w[redacted]d IUP Chronic hypertension w/superimposed preeclampsia w severe features  Plan: Admit to antenatal Prex labs pending Preeclampsia  order set initiated  Lauren Sharp 10/19/2014, 11:55 PM

## 2014-10-19 NOTE — MAU Note (Signed)
Headache all day, took tylenol with no relief. Discharge from hospital yesterday and put no labetalol; last took at 845 pm. Some blurry vision. Epigastric pain since last night. Denies vaginal bleeding or LOF. Positive fetal movement.

## 2014-10-20 ENCOUNTER — Inpatient Hospital Stay (HOSPITAL_COMMUNITY): Payer: Medicaid Other

## 2014-10-20 ENCOUNTER — Encounter (HOSPITAL_COMMUNITY): Payer: Self-pay | Admitting: General Practice

## 2014-10-20 DIAGNOSIS — I5021 Acute systolic (congestive) heart failure: Secondary | ICD-10-CM | POA: Diagnosis present

## 2014-10-20 DIAGNOSIS — Z3A33 33 weeks gestation of pregnancy: Secondary | ICD-10-CM | POA: Diagnosis present

## 2014-10-20 DIAGNOSIS — O99019 Anemia complicating pregnancy, unspecified trimester: Secondary | ICD-10-CM | POA: Diagnosis present

## 2014-10-20 DIAGNOSIS — O113 Pre-existing hypertension with pre-eclampsia, third trimester: Secondary | ICD-10-CM | POA: Diagnosis present

## 2014-10-20 DIAGNOSIS — O119 Pre-existing hypertension with pre-eclampsia, unspecified trimester: Secondary | ICD-10-CM | POA: Diagnosis present

## 2014-10-20 DIAGNOSIS — R062 Wheezing: Secondary | ICD-10-CM | POA: Diagnosis not present

## 2014-10-20 DIAGNOSIS — N390 Urinary tract infection, site not specified: Secondary | ICD-10-CM | POA: Diagnosis not present

## 2014-10-20 DIAGNOSIS — O99334 Smoking (tobacco) complicating childbirth: Secondary | ICD-10-CM | POA: Diagnosis present

## 2014-10-20 DIAGNOSIS — Z8249 Family history of ischemic heart disease and other diseases of the circulatory system: Secondary | ICD-10-CM | POA: Diagnosis not present

## 2014-10-20 DIAGNOSIS — J9601 Acute respiratory failure with hypoxia: Secondary | ICD-10-CM | POA: Diagnosis not present

## 2014-10-20 DIAGNOSIS — R739 Hyperglycemia, unspecified: Secondary | ICD-10-CM | POA: Diagnosis not present

## 2014-10-20 DIAGNOSIS — Z79899 Other long term (current) drug therapy: Secondary | ICD-10-CM | POA: Diagnosis not present

## 2014-10-20 DIAGNOSIS — B9620 Unspecified Escherichia coli [E. coli] as the cause of diseases classified elsewhere: Secondary | ICD-10-CM | POA: Diagnosis present

## 2014-10-20 DIAGNOSIS — E872 Acidosis: Secondary | ICD-10-CM | POA: Diagnosis present

## 2014-10-20 DIAGNOSIS — R0602 Shortness of breath: Secondary | ICD-10-CM | POA: Diagnosis not present

## 2014-10-20 DIAGNOSIS — T380X5A Adverse effect of glucocorticoids and synthetic analogues, initial encounter: Secondary | ICD-10-CM | POA: Diagnosis not present

## 2014-10-20 DIAGNOSIS — F419 Anxiety disorder, unspecified: Secondary | ICD-10-CM | POA: Diagnosis present

## 2014-10-20 DIAGNOSIS — R0600 Dyspnea, unspecified: Secondary | ICD-10-CM | POA: Diagnosis not present

## 2014-10-20 DIAGNOSIS — O903 Peripartum cardiomyopathy: Secondary | ICD-10-CM | POA: Diagnosis present

## 2014-10-20 DIAGNOSIS — Z7951 Long term (current) use of inhaled steroids: Secondary | ICD-10-CM | POA: Diagnosis not present

## 2014-10-20 DIAGNOSIS — F17210 Nicotine dependence, cigarettes, uncomplicated: Secondary | ICD-10-CM | POA: Diagnosis present

## 2014-10-20 DIAGNOSIS — J810 Acute pulmonary edema: Secondary | ICD-10-CM | POA: Diagnosis not present

## 2014-10-20 DIAGNOSIS — O2343 Unspecified infection of urinary tract in pregnancy, third trimester: Secondary | ICD-10-CM | POA: Diagnosis present

## 2014-10-20 DIAGNOSIS — O133 Gestational [pregnancy-induced] hypertension without significant proteinuria, third trimester: Secondary | ICD-10-CM | POA: Diagnosis present

## 2014-10-20 DIAGNOSIS — O321XX0 Maternal care for breech presentation, not applicable or unspecified: Secondary | ICD-10-CM | POA: Diagnosis present

## 2014-10-20 DIAGNOSIS — O99213 Obesity complicating pregnancy, third trimester: Secondary | ICD-10-CM | POA: Diagnosis present

## 2014-10-20 LAB — CBC
HCT: 34.9 % — ABNORMAL LOW (ref 36.0–46.0)
HEMATOCRIT: 34.4 % — AB (ref 36.0–46.0)
HEMOGLOBIN: 12.1 g/dL (ref 12.0–15.0)
Hemoglobin: 12.1 g/dL (ref 12.0–15.0)
MCH: 28.9 pg (ref 26.0–34.0)
MCH: 29.4 pg (ref 26.0–34.0)
MCHC: 34.7 g/dL (ref 30.0–36.0)
MCHC: 35.2 g/dL (ref 30.0–36.0)
MCV: 83.5 fL (ref 78.0–100.0)
MCV: 83.7 fL (ref 78.0–100.0)
PLATELETS: 132 10*3/uL — AB (ref 150–400)
Platelets: 131 10*3/uL — ABNORMAL LOW (ref 150–400)
RBC: 4.11 MIL/uL (ref 3.87–5.11)
RBC: 4.18 MIL/uL (ref 3.87–5.11)
RDW: 14.1 % (ref 11.5–15.5)
RDW: 14.1 % (ref 11.5–15.5)
WBC: 15.8 10*3/uL — ABNORMAL HIGH (ref 4.0–10.5)
WBC: 15.9 10*3/uL — AB (ref 4.0–10.5)

## 2014-10-20 LAB — COMPREHENSIVE METABOLIC PANEL
ALBUMIN: 2.9 g/dL — AB (ref 3.5–5.2)
ALK PHOS: 72 U/L (ref 39–117)
ALT: 19 U/L (ref 0–35)
ALT: 20 U/L (ref 0–35)
ANION GAP: 11 (ref 5–15)
ANION GAP: 8 (ref 5–15)
AST: 30 U/L (ref 0–37)
AST: 35 U/L (ref 0–37)
Albumin: 2.9 g/dL — ABNORMAL LOW (ref 3.5–5.2)
Alkaline Phosphatase: 81 U/L (ref 39–117)
BILIRUBIN TOTAL: 0.5 mg/dL (ref 0.3–1.2)
BUN: 7 mg/dL (ref 6–23)
BUN: 9 mg/dL (ref 6–23)
CALCIUM: 8.8 mg/dL (ref 8.4–10.5)
CHLORIDE: 104 mmol/L (ref 96–112)
CHLORIDE: 106 mmol/L (ref 96–112)
CO2: 22 mmol/L (ref 19–32)
CO2: 24 mmol/L (ref 19–32)
CREATININE: 0.47 mg/dL — AB (ref 0.50–1.10)
Calcium: 9.6 mg/dL (ref 8.4–10.5)
Creatinine, Ser: 0.48 mg/dL — ABNORMAL LOW (ref 0.50–1.10)
GFR calc Af Amer: >90 mL/min (ref >90)
Glucose, Bld: 113 mg/dL — ABNORMAL HIGH (ref 70–99)
Glucose, Bld: 98 mg/dL (ref 70–99)
POTASSIUM: 2.9 mmol/L — AB (ref 3.5–5.1)
POTASSIUM: 3.4 mmol/L — AB (ref 3.5–5.1)
SODIUM: 137 mmol/L (ref 135–145)
SODIUM: 138 mmol/L (ref 135–145)
TOTAL PROTEIN: 5.5 g/dL — AB (ref 6.0–8.3)
Total Bilirubin: 0.6 mg/dL (ref 0.3–1.2)
Total Protein: 5.8 g/dL — ABNORMAL LOW (ref 6.0–8.3)

## 2014-10-20 LAB — PROTEIN / CREATININE RATIO, URINE
Creatinine, Urine: 64 mg/dL
PROTEIN CREATININE RATIO: 1.63 — AB (ref 0.00–0.15)
Total Protein, Urine: 104 mg/dL

## 2014-10-20 MED ORDER — LORAZEPAM 2 MG/ML IJ SOLN
0.5000 mg | Freq: Once | INTRAMUSCULAR | Status: AC
  Administered 2014-10-20: 0.5 mg via INTRAVENOUS
  Filled 2014-10-20: qty 0.25

## 2014-10-20 MED ORDER — LORAZEPAM 1 MG PO TABS
1.0000 mg | ORAL_TABLET | Freq: Every evening | ORAL | Status: DC | PRN
  Administered 2014-10-20 – 2014-10-23 (×3): 1 mg via ORAL
  Filled 2014-10-20 (×3): qty 1

## 2014-10-20 MED ORDER — PRENATAL MULTIVITAMIN CH
1.0000 | ORAL_TABLET | Freq: Every day | ORAL | Status: DC
  Administered 2014-10-20 – 2014-10-31 (×9): 1 via ORAL
  Filled 2014-10-20 (×10): qty 1

## 2014-10-20 MED ORDER — MAGNESIUM SULFATE BOLUS VIA INFUSION
4.0000 g | Freq: Once | INTRAVENOUS | Status: AC
  Administered 2014-10-20: 4 g via INTRAVENOUS
  Filled 2014-10-20: qty 500

## 2014-10-20 MED ORDER — ZOLPIDEM TARTRATE 5 MG PO TABS
5.0000 mg | ORAL_TABLET | Freq: Every evening | ORAL | Status: DC | PRN
  Administered 2014-10-20: 5 mg via ORAL
  Filled 2014-10-20: qty 1

## 2014-10-20 MED ORDER — LORAZEPAM 2 MG/ML IJ SOLN: 1.0000 mg | Freq: Every evening | INTRAMUSCULAR | Status: DC | PRN

## 2014-10-20 MED ORDER — LABETALOL HCL 200 MG PO TABS
200.0000 mg | ORAL_TABLET | Freq: Two times a day (BID) | ORAL | Status: DC
  Administered 2014-10-20 – 2014-10-22 (×6): 200 mg via ORAL
  Filled 2014-10-20 (×2): qty 1
  Filled 2014-10-20: qty 2
  Filled 2014-10-20 (×4): qty 1

## 2014-10-20 MED ORDER — ACETAMINOPHEN 325 MG PO TABS
650.0000 mg | ORAL_TABLET | ORAL | Status: DC | PRN
  Administered 2014-10-20 (×3): 650 mg via ORAL
  Filled 2014-10-20 (×3): qty 2

## 2014-10-20 MED ORDER — DOCUSATE SODIUM 100 MG PO CAPS
100.0000 mg | ORAL_CAPSULE | Freq: Every day | ORAL | Status: DC
  Administered 2014-10-20: 100 mg via ORAL
  Filled 2014-10-20 (×2): qty 1

## 2014-10-20 MED ORDER — LABETALOL HCL 200 MG PO TABS
200.0000 mg | ORAL_TABLET | Freq: Two times a day (BID) | ORAL | Status: DC
  Filled 2014-10-20 (×2): qty 1

## 2014-10-20 MED ORDER — LORAZEPAM 2 MG/ML IJ SOLN
1.0000 mg | Freq: Once | INTRAMUSCULAR | Status: AC
  Administered 2014-10-20: 1 mg via INTRAMUSCULAR

## 2014-10-20 MED ORDER — CALCIUM CARBONATE ANTACID 500 MG PO CHEW
2.0000 | CHEWABLE_TABLET | ORAL | Status: DC | PRN
  Filled 2014-10-20: qty 2

## 2014-10-20 MED ORDER — MAGNESIUM SULFATE 40 G IN LACTATED RINGERS - SIMPLE
2.0000 g/h | INTRAVENOUS | Status: DC
  Administered 2014-10-20: 2 g/h via INTRAVENOUS
  Filled 2014-10-20 (×2): qty 500

## 2014-10-20 MED ORDER — LORAZEPAM 1 MG PO TABS: 1.0000 mg | ORAL_TABLET | Freq: Once | ORAL | Status: DC

## 2014-10-20 NOTE — Progress Notes (Signed)
Patient ID: Lauren Sharp, female   DOB: 04/23/1991, 24 y.o.   MRN: [id number] Pt c/o general discomfort trying to rest in bed with monitors on.  She did not sleep last night and feels anxious.   HA resolved.  No other PIH sx.  Tolerating magnesium  BP for most part 150/90's.  Had one higher at 180/115 and received her AM labetalol early. Excellent UOP.  FHR category 1 US on 10/18/14 vtx, normal growth at 71%ile, normal AFI  4#14oz  Labs stable  Pt at 33 1/7 weeks with preeclampsia by 24 hour urine showing 450+mg protein.  BP in severe range on admission, and HA on admission which has  resolved.  Will move to antenatal.  D/w pt preeclampsia at length and that we would likely deliver her at 34 weeks if not needed before for worsening lab studies or other  severe features. Will give ativan x 1 to help her rest and anxiety.

## 2014-10-20 NOTE — H&P (Addendum)
Lauren Sharp is a 24 y.o. female G1P0  At 3433 1/7 weeks( EDD 12/07/2014 by 9 week US)   who was recently admitted to observation 10/17/14 and completed a 24 hour urine and labwork.  In her last admission, her BP improved on labetalol 200mg  po BID and she requested outpatient management.  Labs were normal and she was aymptomatic at that point.  BP was 150/90 range at d/c.  Her 24 hour urine results did not return until late 10/19/14 and were c/w with preeclampsia at 454mg  protein in 24 hours. [name] developed a HA on 10/19/14 late in the evening and returned to the hospital this AM around midnight.  Her BP in MAU was markedly elevated again, and [facility] Pt with probable borderline CHTN, baseline BP in first trimester 120/70-80, but began to elevate more around 29 weeks. Admitted then with labs and 24 hour protein WNL and received steroids. She has been followed closely outpatient since that time, with labs and NST's. She had been stable on labetalol 100mg  po BID until 3/18  and when presenting for NST, BP markedly elevated to 180/120 in office. Sent to MAU and admitted to observation as above.  Steroids were repeated 3/18 and 10/18/14.   Maternal Medical History:  Contractions: Frequency: rare.  Perceived severity is mild.   Fetal activity: Perceived fetal activity is normal.   Prenatal complications: PIH.  Prenatal Complications - Diabetes: none.   OB History    Gravida Para Term Preterm AB TAB SAB Ectopic Multiple Living   1         0     Past Medical History  Diagnosis Date  . Gonorrhea   . Bacterial vaginosis   . Psoriasis   . Vaginal Pap smear, abnormal   . Hypertension    Past Surgical History  Procedure Laterality Date  . Tooth extraction    . No past surgeries     Family History: family history includes Alcohol abuse in her father; Arthritis in her father and mother; Asthma in her father; Cancer  in her father; Depression in her mother; Diabetes in her father; Drug abuse in her father; Early death in her father; Heart disease in her father; Hyperlipidemia in her maternal grandfather, maternal grandmother, and mother; Learning disabilities in her mother and sister; Mental illness in her mother; Varicose Veins in her mother. Social History:  reports that she has been smoking Cigarettes. She has a 10 pack-year smoking history. She has never used smokeless tobacco. She reports that she drinks alcohol. She reports that she does not use illicit drugs.   Prenatal Transfer Tool  Maternal Diabetes: No Genetic Screening: Declined Maternal Ultrasounds/Referrals: Normal Fetal Ultrasounds or other Referrals: None Maternal Substance Abuse: Yes: Type: Smoker Significant Maternal Medications: Meds include: Other:  labetalol Significant Maternal Lab Results: None Other Comments: None  Review of Systems  Gastrointestinal: Negative for abdominal pain.  Neurological: Negative for headaches.      Blood pressure 144/65, pulse 81, temperature 98.8 F (37.1 C), temperature source Oral, resp. rate 18, height 5\' 4"  (1.626 m), weight 99.791 kg (220 lb), last menstrual period 03/04/2014. Maternal Exam:  Uterine Assessment: Contraction strength is mild. Contraction frequency is rare.   Abdomen: Patient reports no abdominal tenderness. Introitus: Normal vulva. Normal vagina.   Physical Exam  Constitutional: She appears well-developed.  Cardiovascular: Normal rate and regular rhythm.  Respiratory: Effort normal and breath sounds normal.  GI: Soft.  Genitourinary: Vagina normal and uterus  normal.  Neurological: She is alert.  Psychiatric: She has a normal mood and affect.    Prenatal labs: ABO, Rh: --/--/O NEG (02/25 1545) Antibody: POS (02/25 1545) Rubella: Immune (10/08 0000) RPR: Nonreactive (02/11 0000)  HBsAg: Negative (10/08 0000)  HIV: Non-reactive (02/11 0000)  GBS:   not  yet done One hour GTT 158 Three hour GTT 84/167/181/121  Assessment/Plan: After admission, pt received all the IV labetalol with her protocol and BP did not get below 170/105.  At that point, I decided to initiate magnesium. Her labs remain WNL.  Platelets have trended from 150K to 130K and LFT's have trended from teens to 20-30 range.  She has excellent UOP.  FHR is category 1.  Oliver Pila 10/17/2014, 12:36 PM          Routing History

## 2014-10-20 NOTE — Progress Notes (Signed)
BP stable, she feels ok FHT- reactive Will continue magnesium and observation

## 2014-10-20 NOTE — Progress Notes (Signed)
Pt has order for 1mg  Ativan IM. 2mg  vial sent from pharmacy. !mg Ativan given IM, 1mg  wasted in sink witnessed by Delcie Rochaney Gagon RNC-OB.

## 2014-10-21 ENCOUNTER — Inpatient Hospital Stay (HOSPITAL_COMMUNITY): Payer: Medicaid Other | Admitting: Anesthesiology

## 2014-10-21 ENCOUNTER — Encounter (HOSPITAL_COMMUNITY)
Admission: AD | Disposition: A | Discharge status: Home / Self Care | Payer: Self-pay | Source: Ambulatory Visit | Attending: Internal Medicine

## 2014-10-21 ENCOUNTER — Inpatient Hospital Stay (HOSPITAL_COMMUNITY): Payer: Medicaid Other

## 2014-10-21 ENCOUNTER — Encounter (HOSPITAL_COMMUNITY): Payer: Self-pay | Admitting: Anesthesiology

## 2014-10-21 DIAGNOSIS — O903 Peripartum cardiomyopathy: Secondary | ICD-10-CM

## 2014-10-21 DIAGNOSIS — O113 Pre-existing hypertension with pre-eclampsia, third trimester: Secondary | ICD-10-CM

## 2014-10-21 DIAGNOSIS — Z98891 History of uterine scar from previous surgery: Secondary | ICD-10-CM

## 2014-10-21 DIAGNOSIS — R739 Hyperglycemia, unspecified: Secondary | ICD-10-CM

## 2014-10-21 DIAGNOSIS — J9601 Acute respiratory failure with hypoxia: Secondary | ICD-10-CM

## 2014-10-21 DIAGNOSIS — J810 Acute pulmonary edema: Secondary | ICD-10-CM

## 2014-10-21 DIAGNOSIS — I5021 Acute systolic (congestive) heart failure: Secondary | ICD-10-CM

## 2014-10-21 DIAGNOSIS — R062 Wheezing: Secondary | ICD-10-CM

## 2014-10-21 DIAGNOSIS — R0902 Hypoxemia: Secondary | ICD-10-CM

## 2014-10-21 DIAGNOSIS — O159 Eclampsia, unspecified as to time period: Secondary | ICD-10-CM

## 2014-10-21 LAB — COMPREHENSIVE METABOLIC PANEL
ALK PHOS: 108 U/L (ref 39–117)
ALT: 20 U/L (ref 0–35)
ALT: 23 U/L (ref 0–35)
ANION GAP: 8 (ref 5–15)
AST: 27 U/L (ref 0–37)
AST: 32 U/L (ref 0–37)
Albumin: 2.5 g/dL — ABNORMAL LOW (ref 3.5–5.2)
Albumin: 2.8 g/dL — ABNORMAL LOW (ref 3.5–5.2)
Alkaline Phosphatase: 93 U/L (ref 39–117)
Anion gap: 7 (ref 5–15)
BUN: 10 mg/dL (ref 6–23)
BUN: 8 mg/dL (ref 6–23)
CALCIUM: 8.2 mg/dL — AB (ref 8.4–10.5)
CALCIUM: 8.4 mg/dL (ref 8.4–10.5)
CO2: 22 mmol/L (ref 19–32)
CO2: 24 mmol/L (ref 19–32)
CREATININE: 0.64 mg/dL (ref 0.50–1.10)
Chloride: 106 mmol/L (ref 96–112)
Chloride: 107 mmol/L (ref 96–112)
Creatinine, Ser: 0.66 mg/dL (ref 0.50–1.10)
GFR calc Af Amer: >90 mL/min (ref >90)
GFR calc Af Amer: >90 mL/min (ref >90)
GFR calc non Af Amer: >90 mL/min (ref >90)
Glucose, Bld: 205 mg/dL — ABNORMAL HIGH (ref 70–99)
Glucose, Bld: 226 mg/dL — ABNORMAL HIGH (ref 70–99)
POTASSIUM: 3.8 mmol/L (ref 3.5–5.1)
Potassium: 3.3 mmol/L — ABNORMAL LOW (ref 3.5–5.1)
Sodium: 137 mmol/L (ref 135–145)
Sodium: 137 mmol/L (ref 135–145)
Total Bilirubin: 0.8 mg/dL (ref 0.3–1.2)
Total Bilirubin: 0.9 mg/dL (ref 0.3–1.2)
Total Protein: 5.4 g/dL — ABNORMAL LOW (ref 6.0–8.3)
Total Protein: 5.9 g/dL — ABNORMAL LOW (ref 6.0–8.3)

## 2014-10-21 LAB — CBC
HCT: 32.9 % — ABNORMAL LOW (ref 36.0–46.0)
HCT: 33.8 % — ABNORMAL LOW (ref 36.0–46.0)
HCT: 37.4 % (ref 36.0–46.0)
Hemoglobin: 11.6 g/dL — ABNORMAL LOW (ref 12.0–15.0)
Hemoglobin: 11.6 g/dL — ABNORMAL LOW (ref 12.0–15.0)
Hemoglobin: 12.9 g/dL (ref 12.0–15.0)
MCH: 29.1 pg (ref 26.0–34.0)
MCH: 29.2 pg (ref 26.0–34.0)
MCH: 29.8 pg (ref 26.0–34.0)
MCHC: 34.3 g/dL (ref 30.0–36.0)
MCHC: 34.5 g/dL (ref 30.0–36.0)
MCHC: 35.3 g/dL (ref 30.0–36.0)
MCV: 84.6 fL (ref 78.0–100.0)
MCV: 84.6 fL (ref 78.0–100.0)
MCV: 84.9 fL (ref 78.0–100.0)
PLATELETS: 144 10*3/uL — AB (ref 150–400)
PLATELETS: 156 10*3/uL (ref 150–400)
PLATELETS: 225 10*3/uL (ref 150–400)
RBC: 3.89 MIL/uL (ref 3.87–5.11)
RBC: 3.98 MIL/uL (ref 3.87–5.11)
RBC: 4.42 MIL/uL (ref 3.87–5.11)
RDW: 14.4 % (ref 11.5–15.5)
RDW: 14.6 % (ref 11.5–15.5)
RDW: 14.8 % (ref 11.5–15.5)
WBC: 15.7 10*3/uL — ABNORMAL HIGH (ref 4.0–10.5)
WBC: 22.1 10*3/uL — ABNORMAL HIGH (ref 4.0–10.5)
WBC: 36.1 10*3/uL — AB (ref 4.0–10.5)

## 2014-10-21 LAB — BLOOD GAS, ARTERIAL
ACID-BASE DEFICIT: 0.9 mmol/L (ref 0.0–2.0)
Acid-base deficit: 3.3 mmol/L — ABNORMAL HIGH (ref 0.0–2.0)
BICARBONATE: 24.3 meq/L — AB (ref 20.0–24.0)
BICARBONATE: 21.8 meq/L (ref 20.0–24.0)
DRAWN BY: 40556
Delivery systems: POSITIVE
Drawn by: 40556
Expiratory PAP: 8
FIO2: 1 %
FIO2: 100 %
INSPIRATORY PAP: 14
O2 Content: 15 L/min
O2 SAT: 100 %
O2 Saturation: 93 %
PCO2 ART: 57.7 mmHg — AB (ref 35.0–45.0)
PO2 ART: 89.9 mmHg (ref 80.0–100.0)
PO2 ART: 487 mmHg — AB (ref 80.0–100.0)
TCO2: 26.1 mmol/L (ref 0–100)
TCO2: 22.8 mmol/L (ref 0–100)
pCO2 arterial: 31.5 mmHg — ABNORMAL LOW (ref 35.0–45.0)
pH, Arterial: 7.248 — ABNORMAL LOW (ref 7.350–7.450)
pH, Arterial: 7.455 — ABNORMAL HIGH (ref 7.350–7.450)

## 2014-10-21 LAB — DIC (DISSEMINATED INTRAVASCULAR COAGULATION) PANEL
INR: 1.09 (ref 0.00–1.49)
PROTHROMBIN TIME: 14.3 s (ref 11.6–15.2)

## 2014-10-21 LAB — GLUCOSE, CAPILLARY
GLUCOSE-CAPILLARY: 176 mg/dL — AB (ref 70–99)
Glucose-Capillary: 144 mg/dL — ABNORMAL HIGH (ref 70–99)
Glucose-Capillary: 122 mg/dL — ABNORMAL HIGH (ref 70–99)
Glucose-Capillary: 138 mg/dL — ABNORMAL HIGH (ref 70–99)

## 2014-10-21 LAB — DIC (DISSEMINATED INTRAVASCULAR COAGULATION)PANEL
D-Dimer, Quant: 3.14 ug/mL-FEU — ABNORMAL HIGH (ref 0.00–0.48)
Fibrinogen: 559 mg/dL — ABNORMAL HIGH (ref 204–475)
Platelets: 134 10*3/uL — ABNORMAL LOW (ref 150–400)
Smear Review: NONE SEEN
aPTT: 29 seconds (ref 24–37)

## 2014-10-21 LAB — MAGNESIUM: MAGNESIUM: 4.9 mg/dL — AB (ref 1.5–2.5)

## 2014-10-21 LAB — MRSA PCR SCREENING: MRSA by PCR: NEGATIVE

## 2014-10-21 LAB — LACTIC ACID, PLASMA
LACTIC ACID, VENOUS: 2.1 mmol/L — AB (ref 0.5–2.0)
Lactic Acid, Venous: 1.9 mmol/L (ref 0.5–2.0)

## 2014-10-21 LAB — GROUP B STREP BY PCR: Group B strep by PCR: NEGATIVE

## 2014-10-21 SURGERY — Surgical Case
Anesthesia: Spinal

## 2014-10-21 MED ORDER — PHENYTOIN SODIUM 50 MG/ML IJ SOLN
100.0000 mg | Freq: Three times a day (TID) | INTRAMUSCULAR | Status: DC
  Filled 2014-10-21 (×2): qty 2

## 2014-10-21 MED ORDER — NALBUPHINE HCL 10 MG/ML IJ SOLN: 5.0000 mg | Freq: Once | INTRAMUSCULAR | Status: AC | PRN

## 2014-10-21 MED ORDER — ONDANSETRON HCL 4 MG/2ML IJ SOLN
INTRAMUSCULAR | Status: DC | PRN
  Administered 2014-10-21: 4 mg via INTRAVENOUS

## 2014-10-21 MED ORDER — SODIUM CHLORIDE 0.9 % IV SOLN: 250.0000 mL | INTRAVENOUS | Status: DC | PRN

## 2014-10-21 MED ORDER — SODIUM CHLORIDE 0.9 % IJ SOLN: 3.0000 mL | Freq: Two times a day (BID) | INTRAMUSCULAR | Status: DC

## 2014-10-21 MED ORDER — CITRIC ACID-SODIUM CITRATE 334-500 MG/5ML PO SOLN
30.0000 mL | Freq: Once | ORAL | Status: AC
  Administered 2014-10-21: 30 mL via ORAL
  Filled 2014-10-21: qty 30

## 2014-10-21 MED ORDER — METHYLPREDNISOLONE SODIUM SUCC 40 MG IJ SOLR
40.0000 mg | Freq: Two times a day (BID) | INTRAMUSCULAR | Status: DC
  Administered 2014-10-21 (×2): 40 mg via INTRAVENOUS
  Filled 2014-10-21 (×4): qty 1

## 2014-10-21 MED ORDER — FENTANYL CITRATE 0.05 MG/ML IJ SOLN
INTRAMUSCULAR | Status: DC | PRN
  Administered 2014-10-21 (×2): 25 ug via INTRAVENOUS
  Administered 2014-10-21: 25 ug via INTRATHECAL
  Administered 2014-10-21: 25 ug via INTRAVENOUS

## 2014-10-21 MED ORDER — IBUPROFEN 600 MG PO TABS
600.0000 mg | ORAL_TABLET | Freq: Four times a day (QID) | ORAL | Status: DC
  Administered 2014-10-21 – 2014-10-24 (×11): 600 mg via ORAL
  Filled 2014-10-21 (×11): qty 1

## 2014-10-21 MED ORDER — INSULIN ASPART 100 UNIT/ML ~~LOC~~ SOLN
0.0000 [IU] | SUBCUTANEOUS | Status: DC
  Administered 2014-10-21: 3 [IU] via SUBCUTANEOUS
  Administered 2014-10-21 (×2): 2 [IU] via SUBCUTANEOUS

## 2014-10-21 MED ORDER — SENNOSIDES-DOCUSATE SODIUM 8.6-50 MG PO TABS
2.0000 | ORAL_TABLET | ORAL | Status: DC
  Administered 2014-10-22 – 2014-10-27 (×7): 2 via ORAL
  Filled 2014-10-21 (×7): qty 2

## 2014-10-21 MED ORDER — TETANUS-DIPHTH-ACELL PERTUSSIS 5-2.5-18.5 LF-MCG/0.5 IM SUSP
0.5000 mL | Freq: Once | INTRAMUSCULAR | Status: DC
  Filled 2014-10-21: qty 0.5

## 2014-10-21 MED ORDER — CHLOROPROCAINE HCL 3 % IJ SOLN
INTRAMUSCULAR | Status: DC | PRN
  Administered 2014-10-21: 20 mL

## 2014-10-21 MED ORDER — ALBUTEROL SULFATE (2.5 MG/3ML) 0.083% IN NEBU
2.5000 mg | INHALATION_SOLUTION | RESPIRATORY_TRACT | Status: DC | PRN
Start: 2014-10-21 — End: 2014-10-22
  Administered 2014-10-21: 2.5 mg via RESPIRATORY_TRACT

## 2014-10-21 MED ORDER — POTASSIUM CHLORIDE CRYS ER 20 MEQ PO TBCR
40.0000 meq | EXTENDED_RELEASE_TABLET | Freq: Once | ORAL | Status: AC
  Administered 2014-10-21: 40 meq via ORAL
  Filled 2014-10-21: qty 2

## 2014-10-21 MED ORDER — BUDESONIDE 0.25 MG/2ML IN SUSP
0.2500 mg | Freq: Four times a day (QID) | RESPIRATORY_TRACT | Status: DC
  Administered 2014-10-21 – 2014-10-22 (×5): 0.25 mg via RESPIRATORY_TRACT
  Filled 2014-10-21 (×6): qty 2

## 2014-10-21 MED ORDER — NALBUPHINE HCL 10 MG/ML IJ SOLN
5.0000 mg | INTRAMUSCULAR | Status: DC | PRN
  Filled 2014-10-21: qty 0.5

## 2014-10-21 MED ORDER — DIPHENHYDRAMINE HCL 25 MG PO CAPS: 25.0000 mg | ORAL_CAPSULE | Freq: Four times a day (QID) | ORAL | Status: DC | PRN

## 2014-10-21 MED ORDER — NALOXONE HCL 0.4 MG/ML IJ SOLN: 0.4000 mg | INTRAMUSCULAR | Status: DC | PRN

## 2014-10-21 MED ORDER — IBUPROFEN 600 MG PO TABS: 600.0000 mg | ORAL_TABLET | Freq: Four times a day (QID) | ORAL | Status: DC

## 2014-10-21 MED ORDER — SODIUM CHLORIDE 0.9 % IV SOLN
1000.0000 mg | Freq: Once | INTRAVENOUS | Status: AC
  Administered 2014-10-21: 1000 mg via INTRAVENOUS
  Filled 2014-10-21: qty 20

## 2014-10-21 MED ORDER — OXYCODONE-ACETAMINOPHEN 5-325 MG PO TABS
1.0000 | ORAL_TABLET | ORAL | Status: DC | PRN
  Administered 2014-10-22 – 2014-10-27 (×4): 1 via ORAL
  Filled 2014-10-21 (×4): qty 1

## 2014-10-21 MED ORDER — ZOLPIDEM TARTRATE 5 MG PO TABS
5.0000 mg | ORAL_TABLET | Freq: Every evening | ORAL | Status: DC | PRN
  Administered 2014-10-23 – 2014-10-27 (×3): 5 mg via ORAL
  Filled 2014-10-21 (×3): qty 1

## 2014-10-21 MED ORDER — CEFAZOLIN SODIUM-DEXTROSE 2-3 GM-% IV SOLR
2.0000 g | Freq: Once | INTRAVENOUS | Status: AC
  Administered 2014-10-21: 2 g via INTRAVENOUS
  Filled 2014-10-21: qty 50

## 2014-10-21 MED ORDER — SODIUM CHLORIDE 0.9 % IV SOLN
250.0000 mL | INTRAVENOUS | Status: DC | PRN
  Administered 2014-10-21: 250 mL via INTRAVENOUS

## 2014-10-21 MED ORDER — ONDANSETRON HCL 4 MG/2ML IJ SOLN
INTRAMUSCULAR | Status: AC
  Filled 2014-10-21: qty 2

## 2014-10-21 MED ORDER — MORPHINE SULFATE 0.5 MG/ML IJ SOLN
INTRAMUSCULAR | Status: AC
  Filled 2014-10-21: qty 10

## 2014-10-21 MED ORDER — SIMETHICONE 80 MG PO CHEW
80.0000 mg | CHEWABLE_TABLET | ORAL | Status: DC | PRN
  Filled 2014-10-21 (×2): qty 1

## 2014-10-21 MED ORDER — SCOPOLAMINE 1 MG/3DAYS TD PT72
1.0000 | MEDICATED_PATCH | Freq: Once | TRANSDERMAL | Status: AC
  Administered 2014-10-21: 1.5 mg via TRANSDERMAL

## 2014-10-21 MED ORDER — DEXAMETHASONE SODIUM PHOSPHATE 10 MG/ML IJ SOLN
10.0000 mg | Freq: Once | INTRAMUSCULAR | Status: AC
  Administered 2014-10-21: 10 mg via INTRAVENOUS
  Filled 2014-10-21: qty 1

## 2014-10-21 MED ORDER — FENTANYL CITRATE 0.05 MG/ML IJ SOLN
INTRAMUSCULAR | Status: AC
  Filled 2014-10-21: qty 2

## 2014-10-21 MED ORDER — WITCH HAZEL-GLYCERIN EX PADS: 1.0000 "application " | MEDICATED_PAD | CUTANEOUS | Status: DC | PRN

## 2014-10-21 MED ORDER — DIBUCAINE 1 % RE OINT: 1.0000 "application " | TOPICAL_OINTMENT | RECTAL | Status: DC | PRN

## 2014-10-21 MED ORDER — ONDANSETRON HCL 4 MG/2ML IJ SOLN: 4.0000 mg | Freq: Three times a day (TID) | INTRAMUSCULAR | Status: DC | PRN

## 2014-10-21 MED ORDER — SODIUM CHLORIDE 0.9 % IV SOLN
250.0000 mg | Freq: Three times a day (TID) | INTRAVENOUS | Status: DC
  Filled 2014-10-21: qty 5

## 2014-10-21 MED ORDER — MENTHOL 3 MG MT LOZG
1.0000 | LOZENGE | OROMUCOSAL | Status: DC | PRN
  Administered 2014-10-22: 3 mg via ORAL
  Filled 2014-10-21: qty 9

## 2014-10-21 MED ORDER — MORPHINE SULFATE 4 MG/ML IJ SOLN
INTRAMUSCULAR | Status: AC
  Filled 2014-10-21: qty 1

## 2014-10-21 MED ORDER — SCOPOLAMINE 1 MG/3DAYS TD PT72
MEDICATED_PATCH | TRANSDERMAL | Status: AC
  Administered 2014-10-21: 1.5 mg via TRANSDERMAL
  Filled 2014-10-21: qty 1

## 2014-10-21 MED ORDER — OXYTOCIN 40 UNITS IN LACTATED RINGERS INFUSION - SIMPLE MED
62.5000 mL/h | INTRAVENOUS | Status: AC
  Administered 2014-10-22: 62.5 mL/h via INTRAVENOUS
  Filled 2014-10-21: qty 1000

## 2014-10-21 MED ORDER — CEFAZOLIN SODIUM-DEXTROSE 2-3 GM-% IV SOLR
INTRAVENOUS | Status: AC
  Filled 2014-10-21: qty 50

## 2014-10-21 MED ORDER — LABETALOL HCL 5 MG/ML IV SOLN
80.0000 mg | Freq: Once | INTRAVENOUS | Status: AC
  Administered 2014-10-21: 40 mg via INTRAVENOUS

## 2014-10-21 MED ORDER — BUPIVACAINE IN DEXTROSE 0.75-8.25 % IT SOLN
INTRATHECAL | Status: DC | PRN
  Administered 2014-10-21: 1.4 mL via INTRATHECAL

## 2014-10-21 MED ORDER — FUROSEMIDE 10 MG/ML IJ SOLN
10.0000 mg | Freq: Once | INTRAMUSCULAR | Status: AC
  Administered 2014-10-21: 10 mg via INTRAVENOUS
  Filled 2014-10-21: qty 2

## 2014-10-21 MED ORDER — SIMETHICONE 80 MG PO CHEW
80.0000 mg | CHEWABLE_TABLET | ORAL | Status: DC
  Administered 2014-10-22 – 2014-10-23 (×4): 80 mg via ORAL
  Filled 2014-10-21 (×3): qty 1

## 2014-10-21 MED ORDER — FUROSEMIDE 10 MG/ML IJ SOLN
10.0000 mg | Freq: Once | INTRAMUSCULAR | Status: AC
  Administered 2014-10-21: 20 mg via INTRAVENOUS
  Filled 2014-10-21: qty 1

## 2014-10-21 MED ORDER — OXYCODONE-ACETAMINOPHEN 5-325 MG PO TABS
2.0000 | ORAL_TABLET | ORAL | Status: DC | PRN
  Administered 2014-10-23 – 2014-10-28 (×15): 2 via ORAL
  Filled 2014-10-21 (×15): qty 2

## 2014-10-21 MED ORDER — DIPHENHYDRAMINE HCL 50 MG/ML IJ SOLN: 12.5000 mg | INTRAMUSCULAR | Status: DC | PRN

## 2014-10-21 MED ORDER — NALOXONE HCL 1 MG/ML IJ SOLN: 1.0000 ug/kg/h | INTRAVENOUS | Status: DC | PRN

## 2014-10-21 MED ORDER — CEFAZOLIN SODIUM-DEXTROSE 2-3 GM-% IV SOLR
2.0000 g | Freq: Three times a day (TID) | INTRAVENOUS | Status: AC
  Administered 2014-10-21 – 2014-10-22 (×2): 2 g via INTRAVENOUS
  Filled 2014-10-21 (×2): qty 50

## 2014-10-21 MED ORDER — SODIUM CHLORIDE 0.9 % IJ SOLN
3.0000 mL | INTRAMUSCULAR | Status: DC | PRN
  Administered 2014-10-22: 3 mL via INTRAVENOUS
  Filled 2014-10-21: qty 3

## 2014-10-21 MED ORDER — CHLOROPROCAINE HCL 3 % IJ SOLN
INTRAMUSCULAR | Status: AC
  Filled 2014-10-21: qty 20

## 2014-10-21 MED ORDER — LANOLIN HYDROUS EX OINT: 1.0000 "application " | TOPICAL_OINTMENT | CUTANEOUS | Status: DC | PRN

## 2014-10-21 MED ORDER — LORAZEPAM 2 MG/ML IJ SOLN
0.5000 mg | INTRAMUSCULAR | Status: DC | PRN
  Administered 2014-10-21: 0.5 mg via INTRAVENOUS
  Filled 2014-10-21 (×2): qty 0.5

## 2014-10-21 MED ORDER — MORPHINE SULFATE (PF) 0.5 MG/ML IJ SOLN
INTRAMUSCULAR | Status: DC | PRN
  Administered 2014-10-21: .15 mg via EPIDURAL

## 2014-10-21 MED ORDER — ACETAMINOPHEN 325 MG PO TABS
650.0000 mg | ORAL_TABLET | ORAL | Status: DC | PRN
  Administered 2014-10-28: 650 mg via ORAL
  Filled 2014-10-21: qty 2

## 2014-10-21 MED ORDER — SODIUM CHLORIDE 0.9 % IJ SOLN: 3.0000 mL | INTRAMUSCULAR | Status: DC | PRN

## 2014-10-21 MED ORDER — OXYTOCIN 10 UNIT/ML IJ SOLN
40.0000 [IU] | INTRAVENOUS | Status: DC | PRN
  Administered 2014-10-21: 40 [IU] via INTRAVENOUS

## 2014-10-21 MED ORDER — FENTANYL CITRATE 0.05 MG/ML IJ SOLN: 25.0000 ug | INTRAMUSCULAR | Status: DC | PRN

## 2014-10-21 MED ORDER — OXYTOCIN 10 UNIT/ML IJ SOLN
INTRAMUSCULAR | Status: AC
  Filled 2014-10-21: qty 4

## 2014-10-21 MED ORDER — SIMETHICONE 80 MG PO CHEW
80.0000 mg | CHEWABLE_TABLET | Freq: Three times a day (TID) | ORAL | Status: DC
  Administered 2014-10-23: 80 mg via ORAL
  Filled 2014-10-21 (×3): qty 1

## 2014-10-21 MED ORDER — LACTATED RINGERS IV SOLN
INTRAVENOUS | Status: DC
  Administered 2014-10-21: 23:00:00 via INTRAVENOUS

## 2014-10-21 MED ORDER — MORPHINE SULFATE 4 MG/ML IJ SOLN
2.0000 mg | INTRAMUSCULAR | Status: DC | PRN
  Administered 2014-10-21: 2 mg via INTRAVENOUS

## 2014-10-21 MED ORDER — PHENYTOIN SODIUM 50 MG/ML IJ SOLN
100.0000 mg | Freq: Three times a day (TID) | INTRAMUSCULAR | Status: DC
  Administered 2014-10-21 – 2014-10-22 (×3): 100 mg via INTRAVENOUS
  Filled 2014-10-21 (×7): qty 2

## 2014-10-21 MED ORDER — LABETALOL HCL 5 MG/ML IV SOLN: 20.0000 mg | INTRAVENOUS | Status: DC | PRN

## 2014-10-21 MED ORDER — MEASLES, MUMPS & RUBELLA VAC ~~LOC~~ INJ: 0.5000 mL | INJECTION | Freq: Once | SUBCUTANEOUS | Status: DC

## 2014-10-21 MED ORDER — MEPERIDINE HCL 25 MG/ML IJ SOLN: 6.2500 mg | INTRAMUSCULAR | Status: DC | PRN

## 2014-10-21 MED ORDER — PHENYTOIN SODIUM 50 MG/ML IJ SOLN: 100.0000 mg | Freq: Once | INTRAMUSCULAR | Status: DC

## 2014-10-21 MED ORDER — IPRATROPIUM-ALBUTEROL 0.5-2.5 (3) MG/3ML IN SOLN
3.0000 mL | Freq: Four times a day (QID) | RESPIRATORY_TRACT | Status: DC
  Administered 2014-10-21 – 2014-10-22 (×5): 3 mL via RESPIRATORY_TRACT
  Filled 2014-10-21 (×6): qty 3

## 2014-10-21 MED ORDER — DIPHENHYDRAMINE HCL 25 MG PO CAPS: 25.0000 mg | ORAL_CAPSULE | ORAL | Status: DC | PRN

## 2014-10-21 SURGICAL SUPPLY — 39 items
CLAMP CORD UMBIL (MISCELLANEOUS) IMPLANT
CLOTH BEACON ORANGE TIMEOUT ST (SAFETY) ×3 IMPLANT
CONTAINER PREFILL 10% NBF 15ML (MISCELLANEOUS) IMPLANT
DRAPE SHEET LG 3/4 BI-LAMINATE (DRAPES) IMPLANT
DRSG OPSITE POSTOP 4X10 (GAUZE/BANDAGES/DRESSINGS) ×3 IMPLANT
DRSG VASELINE 3X18 (GAUZE/BANDAGES/DRESSINGS) ×3 IMPLANT
DURAPREP 26ML APPLICATOR (WOUND CARE) ×3 IMPLANT
ELECT REM PT RETURN 9FT ADLT (ELECTROSURGICAL) ×3
ELECTRODE REM PT RTRN 9FT ADLT (ELECTROSURGICAL) ×1 IMPLANT
EXTRACTOR VACUUM KIWI (MISCELLANEOUS) IMPLANT
EXTRACTOR VACUUM M CUP 4 TUBE (SUCTIONS) IMPLANT
EXTRACTOR VACUUM M CUP 4' TUBE (SUCTIONS)
GAUZE SPONGE 4X4 12PLY STRL (GAUZE/BANDAGES/DRESSINGS) ×4 IMPLANT
GLOVE BIO SURGEON STRL SZ7.5 (GLOVE) ×3 IMPLANT
GLOVE BIOGEL PI IND STRL 7.5 (GLOVE) IMPLANT
GLOVE BIOGEL PI INDICATOR 7.5 (GLOVE) ×2
GLOVE ECLIPSE 7.0 STRL STRAW (GLOVE) ×2 IMPLANT
GOWN STRL REUS W/TWL LRG LVL3 (GOWN DISPOSABLE) ×6 IMPLANT
GOWN SURGICAL LARGE (GOWNS) ×2 IMPLANT
KIT ABG SYR 3ML LUER SLIP (SYRINGE) IMPLANT
NDL HYPO 25X5/8 SAFETYGLIDE (NEEDLE) IMPLANT
NEEDLE HYPO 25X5/8 SAFETYGLIDE (NEEDLE) IMPLANT
NS IRRIG 1000ML POUR BTL (IV SOLUTION) ×3 IMPLANT
PACK C SECTION WH (CUSTOM PROCEDURE TRAY) ×3 IMPLANT
PAD ABD 7.5X8 STRL (GAUZE/BANDAGES/DRESSINGS) ×2 IMPLANT
PAD OB MATERNITY 4.3X12.25 (PERSONAL CARE ITEMS) ×3 IMPLANT
RETRACTOR WND ALEXIS 25 LRG (MISCELLANEOUS) IMPLANT
RTRCTR C-SECT PINK 25CM LRG (MISCELLANEOUS) IMPLANT
RTRCTR WOUND ALEXIS 25CM LRG (MISCELLANEOUS) ×3
STAPLER VISISTAT 35W (STAPLE) ×2 IMPLANT
SUT PLAIN 0 NONE (SUTURE) IMPLANT
SUT VIC AB 0 CT1 36 (SUTURE) ×24 IMPLANT
SUT VIC AB 3-0 CTX 36 (SUTURE) ×3 IMPLANT
SUT VIC AB 3-0 SH 27 (SUTURE)
SUT VIC AB 3-0 SH 27X BRD (SUTURE) IMPLANT
SUT VIC AB 4-0 KS 27 (SUTURE) IMPLANT
SUT VICRYL 0 TIES 12 18 (SUTURE) IMPLANT
TOWEL OR 17X24 6PK STRL BLUE (TOWEL DISPOSABLE) ×3 IMPLANT
TRAY FOLEY CATH 14FR (SET/KITS/TRAYS/PACK) ×3 IMPLANT

## 2014-10-21 NOTE — Miscellaneous (Signed)
All vital signs have improved as well as the patients LOC. O2 sat 98-100 up from 88. BP 120-130/80-90 down from 192/122. RR 20-30 down from 40-50. Have turned over care to the critical care team.  Anesthesia Pain Follow-up Note  Patient: Lauren LynnRikki Henle  Day # 0  Date of Follow-up: 10/21/2014 Time: 1:35 AM  Last Vitals:  Filed Vitals:   10/21/14 0110  BP: 126/80  Pulse: 118  Temp:   Resp: 37    Level of Consciousness: More aware  Pain: None  Side Effects:None  Catheter Site Exam: No catheters were placed  Plan: Care turned over to critical team  Azadeh Hyder [facility],JOHN Khalel Alms

## 2014-10-21 NOTE — Op Note (Signed)
Lauren Sharp, Lauren Sharp NO.:  [id number]  MEDICAL RECORD NO.:  [id number]  LOCATION:  [facility]                          FACILITY:  [facility]  PHYSICIAN:  Malachi Pro. Ambrose Mantle, M.D. DATE OF BIRTH:  1990/12/02  DATE OF PROCEDURE:  10/21/2014 DATE OF DISCHARGE:                              OPERATIVE REPORT   PREOPERATIVE DIAGNOSES:  Intrauterine pregnancy, 33 weeks 0 days, severe preeclampsia, and pulmonary edema.  POSTOPERATIVE DIAGNOSES:  Intrauterine pregnancy, 33 weeks 0 days, severe preeclampsia, and pulmonary edema with breech presentation.  OPERATION:  Low-transverse cervical C-section.  OPERATOR:  Malachi Pro. Ambrose Mantle, M.D.  ASSISTANT:  Dr. Adrian Blackwater.  ANESTHESIA:  Spinal anesthesia.  DESCRIPTION OF PROCEDURE:  The patient was brought to the operating room and given a spinal anesthetic by Dr. Malen Gauze.  She was placed supine. The abdomen was prepped with DuraPrep, allowed to dry for 3 minutes and in the interim, a time-out was done identifying the patient, the procedure to be done, the reason for the procedure, the presence of STDs in the fact she had received Kefzol.  After anesthesia was confirmed and a 3-minute weight had elapsed, we draped the abdominal wall sterilely, made an incision transversely in the lower abdomen, carried in layers through the skin and subcutaneous tissue down to the fascia.  Incised the fascia and extended it laterally, separated from the rectus muscles superiorly and inferiorly.  Rectus muscle was split in the midline. Peritoneum was opened with sharp dissection.  Incision was enlarged and stretched, and an Alexis retractor was placed into the abdominal cavity exposing the lower uterine segment.  I also used a Balfour bladder blade to pull the lower abdominal wall further down, I felt down and saw that there was a presenting part in the lower uterine segment.  I made a short transverse incision through the superficial layers of  the myometrium, entered the amniotic sac with my finger, got clear fluid, enlarged the incision by pulling superiorly and inferiorly.  Reached down into the pelvis and elevated the presenting part, which was breech and then delivered the baby without any difficulty up to the head and when I got to the head, I had to use Mauriceau-Smellie-Veit maneuver to deliver the baby's head.  The nose and pharynx was suctioned with a bulb.  The cord was clamped.  The infant was given to the neonatologist, who was in attendance, it was Dr.  Francine Graven and she assigned Apgars of the baby at 8 and 9 at 1 and 5 minutes.  Cord blood was obtained.  The placenta was removed intact.  The inside of the uterus was free of any debris.  I got more tone in the uterus by massaging the uterus anteriorly and posteriorly, and running Pitocin wide open.  I then closed the uterine incision with 2 running sutures of 0 Vicryl, locking the first layer and nonlocking on the second layer.  Figure-of-eight suture was required for complete hemostasis.  I bloated each gutter to remove all blood.  I then inspected the tubes and ovaries, which were normal.  I felt like the incision was low enough, that she could be considered a low-transverse incision and could deliver  vaginally at a later date.  At this point, the bowel wall which had made a nuisance of itself trying to creep into the operative field and then packed away with 2 packs was allowed to rest by removing 2 packs.  The abdominal wall was closed in layers and as I stated earlier, the uterus, tubes, and ovaries appeared normal.  The rectus muscle and peritoneum were used to closed in one layer with interrupted sutures of 0 Vicryl.  The fascia was closed with 2 running sutures of 0 Vicryl.  The subcutaneous tissue with a running 3-0 Vicryl and the skin was reapproximated with staples. The patient seemed to tolerate the procedure well.  Blood loss was estimated by the nurse  anesthetist as 650 mL.  Sponge and needle counts were correct.  The patient was returned to recovery.  During the procedure, the patient's blood pressures ran in the 130s-140s/80s-90s. She will continue on Dilantin IV postop.  Dr. Sherrie Georgeecker, the MFM specialist has advised not to use magnesium sulfate.     Malachi Prohomas F. Ambrose MantleHenley, M.D.     TFH/MEDQ  D:  10/21/2014  T:  10/21/2014  Job:  [id number]

## 2014-10-21 NOTE — Plan of Care (Signed)
Problem: Consults Goal: NICU Tour Not meet Outcome: Not Met (add Reason) Yet to be done

## 2014-10-21 NOTE — Anesthesia Preprocedure Evaluation (Signed)
Anesthesia Evaluation  Patient identified by MRN, date of birth, ID band Patient awake  General Assessment Comment:Somnolent in acute respiratory distress  Reviewed: Allergy & Precautions, NPO status , Patient's Chart, lab work & pertinent test results, reviewed documented beta blocker date and time   Airway Mallampati: III       Dental   Pulmonary Current Smoker,    + decreased breath sounds  rales    Cardiovascular hypertension, Pt. on medications and Pt. on home beta blockers Rate:Tachycardia     Neuro/Psych Anxiety    GI/Hepatic   Endo/Other  Morbid obesity  Renal/GU      Musculoskeletal   Abdominal (+) + obese,   Peds  Hematology   Anesthesia Other Findings   Reproductive/Obstetrics                             Anesthesia Physical Anesthesia Plan  ASA: III and emergent  Anesthesia Plan:    Post-op Pain Management:    Induction:   Airway Management Planned:   Additional Equipment:   Intra-op Plan:   Post-operative Plan:   Informed Consent:   Plan Discussed with: CRNA and Surgeon  Anesthesia Plan Comments: (Plan is to transfer to AICU, consult crtical care. CXR, ABG, pending.)        Anesthesia Quick Evaluation

## 2014-10-21 NOTE — Progress Notes (Signed)
Patient ID: Lauren Sharp, female   DOB: 07/08/1991, 24 y.o.   MRN: [id number] Pt still somewhat SOB but O2 sat 99 on nasal cannula. BP 138/100 P 114output good Labs OK Will consult with MFM The cervix is long and closed GBS done

## 2014-10-21 NOTE — Progress Notes (Signed)
CTSP for respiratory distress Pt became SOB, seemed similar to anxiety attack earlier in hospital stay.  SOB did not improve with PO or IV Ativan.  I finished a delivery and came to see the patient who was in obvious respiratory distress.  I called Dr. Arby BarretteHatchett for assistance in assessing her respiratory status and after his assessment called the critical care team.  CXR and ABG c/w probable flash pulmonary edema.  Her BP was elevated to 170-180/110-120, responded well to IV Labetalol.  FHT initially difficult to hear or see due to her use of accessory muscles for respiration, but ultrasound tech confirmed FHT 150 and we have been able to intermittently monitor FHT at 150s.  Her status is improved, Dr. Sung AmabileSimonds has evaluated her, will monitor closely.  No need for delivery at this time.  Magnesium was stopped to make sure that was not a contributing factor, will start Dilantin for seizure prophylaxis.

## 2014-10-21 NOTE — Plan of Care (Signed)
Problem: Consults Goal: Neonatologist Consult Outcome: Not Met (add Reason) Yet to be done

## 2014-10-21 NOTE — Progress Notes (Signed)
CRITICAL VALUE ALERT  Critical value received: Lactic acid 2.1   Date of notification:  10/21/2014  Time of notification:  0224  Critical value read back: yes  Nurse who received alert:  Henderson NewcomerStephanie Detavious Rinn, RN  MD notified (1st page):  Dr. Jackelyn KnifeMeisinger  Time of first page:  0225  Henderson NewcomerStephanie Debhora Titus, RN 10/21/2014 2:28 AM

## 2014-10-21 NOTE — Progress Notes (Signed)
Pt not given 1600 treatments due to c-section

## 2014-10-21 NOTE — Progress Notes (Signed)
Patient called out that she is having difficult breathing. 02 sats low 90 pt refused  Oxygen educated the need for it, she understood and I asked her to keep it on whiles I call Dr. Jackelyn KnifeMeisinger. Order was given for po Ativan 1 mg given at 2252. Patient still anxious after 5 mins called back and Dr. Jackelyn KnifeMeisinger ordered Ativan 0.5 IV given at 2312 and to call back in 10 mins to update him. Listened to her lungs wheezing on right side of lungs called back Dr Jackelyn KnifeMeisinger on his way for delivery on L&D. Ordered Chest xray , CBC, CMP and Mag level. Dr. Jackelyn KnifeMeisinger at bed side at 2340

## 2014-10-21 NOTE — Consult Note (Signed)
Neonatology Consult to Antenatal Patient: 10/21/2014 2:12 PM    I was requested by Dr Ambrose MantleHenley to see this patient in order to provide antenatal counseling due to severe preeclampsia at [redacted] weeks gestation.    Lauren Sharp is a 24 y/o Primigravida who was admitted on 3/20 for pre-eclampsia manifesting as HTN, HA and proteinuria.  She was admitted on MgSO4 but developed severe respiratory distress and pulmonary edema.  Received a course of BMZ ( 3/18 and 3/19) and was on MgSO4 until 2330 last night.  She is currently "not" having active labor.  She is getting Dilantin (seizure prophylaxis) and Labetalol.     I spoke with Lauren Sharp and FOB in AICU. In the presence of other family members.   We discussed in detail what to expect during the delivery of the infant in the next hour including morbidity and mortality.  Also discussed the  usual delivery room resuscitation including possible respiratory complications and need for support including mechanical ventilation, IV access, sepsis work-up, NG/OG feedings ( benefits of BF or formula which was encouraged).  They had a few questions, which I answered.     Thank you for asking us to see this patient and allowing us to participate in her care.     Overton MamMary Ann T Giorgio Chabot, MD (Attending Neonatologist)  Total length of face-to-face or floor/unit time for this encounter was 20 minutes. Counseling and/or coordination of care was greater than fifty percent of the above time.

## 2014-10-21 NOTE — Consult Note (Addendum)
PULMONARY / CRITICAL CARE MEDICINE   Name: Lauren Sharp MRN: [id number] DOB: 07/07/1991    ADMISSION DATE:  10/19/2014 CONSULTATION DATE:  3/21  REFERRING MD :  Meisinger  REASON: acute respiratory distress  PT PROFILE:  [age] F admitted 3/20 with pre-eclampsia manifesting as hypertension, HA and proteinuria. She was admitted for magnesium infusion. On the evening of 3/21 she developed severe respiratory distress, pulm edema pattern on CXR and resp acidosis. Her symptoms improved some with diuresis but she remained markedly SOB. PCCM asked to evaluate  STUDIES:    SIGNIFICANT EVENTS: 3/21 in respiratory failure due to acute pulmonary edema 3/22 Improved with diureses of 5 liters.  SUBJECTIVE:   VITAL SIGNS: Temp:  [97.5 F (36.4 C)-98.6 F (37 C)] 98.6 F (37 C) (03/22 1200) Pulse Rate:  [43-167] 108 (03/22 1400) Resp:  [16-45] 20 (03/22 1400) BP: (113-179)/(67-133) 143/90 mmHg (03/22 1335) SpO2:  [81 %-100 %] 98 % (03/22 1400)  HEMODYNAMICS:   VENTILATOR SETTINGS:   INTAKE / OUTPUT:  Intake/Output Summary (Last 24 hours) at 10/21/14 1446 Last data filed at 10/21/14 1400  Gross per 24 hour  Intake   4545 ml  Output   2613 ml  Net   1932 ml   PHYSICAL EXAMINATION: General: Alert, oriented and following commands. Neuro: no focal deficits HEENT: NCAT, EOMI Cardiovascular: Tachy, regular, no M noted Lungs: Decrease BS bibasilarly but otherwise clear Abdomen: gravid abdomen, obese, nontender, diminished BS Ext: trace symmetric pretibial edema  LABS:  CBC  Recent Labs Lab 10/21/14 0020 10/21/14 0201 10/21/14 0548 10/21/14 1250  WBC 36.1*  --  22.1* 15.7*  HGB 12.9  --  11.6* 11.6*  HCT 37.4  --  33.8* 32.9*  PLT 225 134* 144* 156   Coag's  Recent Labs Lab 10/21/14 0201  APTT 29  INR 1.09   BMET  Recent Labs Lab 10/20/14 0705 10/21/14 0020 10/21/14 0548  NA 137 137 137  K 2.9* 3.8 3.3*  CL 104 106 107  CO2 22 24 22   BUN 7 8 10   CREATININE  0.47* 0.66 0.64  GLUCOSE 113* 226* 205*   Electrolytes  Recent Labs Lab 10/20/14 0705 10/21/14 0020 10/21/14 0548  CALCIUM 8.8 8.4 8.2*  MG  --  4.9*  --    Sepsis Markers  Recent Labs Lab 10/21/14 0102 10/21/14 0348  LATICACIDVEN 2.1* 1.9   ABG  Recent Labs Lab 10/21/14 0008 10/21/14 0243  PHART 7.248* 7.455*  PCO2ART 57.7* 31.5*  PO2ART 89.9 487.0*   Liver Enzymes  Recent Labs Lab 10/20/14 0705 10/21/14 0020 10/21/14 0548  AST 30 32 27  ALT 20 23 20   ALKPHOS 81 108 93  BILITOT 0.5 0.8 0.9  ALBUMIN 2.9* 2.8* 2.5*   Cardiac Enzymes No results for input(s): TROPONINI, PROBNP in the last 168 hours. Glucose  Recent Labs Lab 10/21/14 0407 10/21/14 0829 10/21/14 1212  GLUCAP 176* 144* 122*   CXR: low volumes. Edema pattern  ASSESSMENT / PLAN:  PULMONARY A: Acute respiratory failure Pulmonary edema Wheezing - likely "cardiac asthma" Smoker P:   D/C BiPAP Supp O2 Scheduled nebulized steroids and BDs Discontinue steroids. Keep on the dry side until after delivery today  CARDIOVASCULAR A:  Sinus tachycardia Severe hypertension P:  PRN labetalol to maintain SBP < 160 mmHg Will order echo, if eventful then recommend calling cardiology to evaluate.  RENAL A:   Mild hypervolemia P:   Monitor I/Os Correct electrolytes as indicated Hold further lasix for now Recommend discontinuation  of IVF Monitor electrolytes  GASTROINTESTINAL A:   Obesity P:   SUP N/I Diet per OB post op, she is NPO for now.  HEMATOLOGIC A:  Spurious anemia of pregnancy (mild) P:  DVT px: SQ heparin Monitor CBC intermittently Transfuse per usual guidelines  INFECTIOUS A:   No overt issues P:   Monitor temp, WBC count  ENDOCRINE A:   Stress and steroid induced  P:   SSI ordered - may D/C after discontinuation of steroids.  NEUROLOGIC A:  Anxiety P:   RASS goal: 0 Low dose PRN lorazepam  I reviewed the CXR myself yesterday and today,  significant pulmonary edema but improved today.  Steroids not necessary will D/C but not the decadron ordered for fetal purposes.  If CBGs improve after steroids are discontinued may d/c insulin sliding scale.  Will order an echo with recommendations as above.  Wean O2 to off post off for sat of >92%.  PCCM will sign off, please call back if needed.  Alyson Reedy, M.D. [facility] Pulmonary/Critical Care Medicine. Pager: [phone]. After hours pager: [phone].  10/21/2014, 2:46 PM

## 2014-10-21 NOTE — Transfer of Care (Signed)
Immediate Anesthesia Transfer of Care Note  Patient: Lauren Sharp  Procedure(s) Performed: Procedure(s): CESAREAN SECTION (N/A)  Patient Location: PACU  Anesthesia Type:Spinal  Level of Consciousness: awake, alert  and oriented  Airway & Oxygen Therapy: Patient Spontanous Breathing  Post-op Assessment: Report given to RN and Post -op Vital signs reviewed and stable  Post vital signs: Reviewed and stable  Last Vitals:  Filed Vitals:   10/21/14 1451  BP:   Pulse: 112  Temp:   Resp:     Complications: No apparent anesthesia complications

## 2014-10-21 NOTE — Anesthesia Preprocedure Evaluation (Signed)
Anesthesia Evaluation  Patient identified by MRN, date of birth, ID band Patient awake    Reviewed: Allergy & Precautions, NPO status , Patient's Chart, lab work & pertinent test results, reviewed documented beta blocker date and time   Airway Mallampati: III  TM Distance: >3 FB Neck ROM: Full    Dental no notable dental hx. (+) Teeth Intact   Pulmonary Current Smoker,  breath sounds clear to auscultation  Pulmonary exam normal       Cardiovascular hypertension, Pt. on medications and Pt. on home beta blockers Rhythm:Regular Rate:Normal     Neuro/Psych negative neurological ROS  negative psych ROS   GI/Hepatic Neg liver ROS, GERD-  ,  Endo/Other  Morbid obesity  Renal/GU negative Renal ROS  negative genitourinary   Musculoskeletal negative musculoskeletal ROS (+)   Abdominal (+) + obese,   Peds  Hematology  (+) anemia ,   Anesthesia Other Findings   Reproductive/Obstetrics (+) Pregnancy 33 weeks Severe pre eclampsia                             Anesthesia Physical Anesthesia Plan  ASA: III and emergent  Anesthesia Plan: Spinal   Post-op Pain Management:    Induction:   Airway Management Planned: Natural Airway  Additional Equipment:   Intra-op Plan:   Post-operative Plan:   Informed Consent: I have reviewed the patients History and Physical, chart, labs and discussed the procedure including the risks, benefits and alternatives for the proposed anesthesia with the patient or authorized representative who has indicated his/her understanding and acceptance.   Dental advisory given  Plan Discussed with: Surgeon, Anesthesiologist and CRNA  Anesthesia Plan Comments:         Anesthesia Quick Evaluation

## 2014-10-21 NOTE — Progress Notes (Signed)
Rapid response call at 2330 to antenatal room 153.  When I arrived, pt's MD, RROM RN, and pt's RN at bedside. Pt had severe diaphoresis and respiratory distress on 10L non-rebreather mask. MD was searching for fetal heart tones. Pt's presentation was worsening, therefore he decided to make an immediate transfer to the AICU after chest xray was performed. Pt's family was called with the update by L&D charge RN. Upon arrival to the AICU, CCM (Dr. Sung AmabileSimonds), respiratory therapy and CRNA arrived. Pt's bp was 173/123, hr 163, 100% o2 saturation on 15L non-rebreather mask; a second IV was placed, foley catheter inserted, 40mg  lasix given, 40mg  labetolol given, albuterol, ABGs, critical care medicine consulted/elink camera'd in. Respiratory switched from nonrebreather to BiPaP. Pt tolerated well. BP and HR decreased within ~30 minutes of medications. BiPaP helped slow respirations to ~25-30RR/min.   Henderson NewcomerStephanie Marte Celani, RN 10/21/2014 7:28 AM

## 2014-10-21 NOTE — Progress Notes (Signed)
Ativan 0.5-1.0mg  ordered. 2mg /mL Ativan vial sent from pharmacy. 0.5mg  given = 0.3125mL. 1.5775mL was wasted in sink with Alcide GoodnessKathy Larrabee, RNC-OB witnessing (cosigned)

## 2014-10-21 NOTE — Progress Notes (Addendum)
   Diabetes Treatment Program Recommendations ADA Standards of Care 2016 Diabetes in Pregnancy Target Glucose Ranges: Fasting: 60 - 90 mg/dL Preprandial: 60 - 098105 mg/dL 1 hr postprandial: Less than 140mg /dL (from first bite of meal) 2 hr postprandial: Less than 120 mg/dL (from first bit of meal)  Although there is no hx of DM or GDM,  Noted pt on solumedrol for 6 doses starting today. Has received 10 mg decadron IV shortly after midnight.  Whille on solumedrol, pt may possibly need basal insulin (Would recommend Levemir if needed). Presently on moderate q 4 hrs.   (May want to use the correction scale per the diabetes in pregnancy order set based on wt in kg and gestational age:  Pt at 220 lbs - 100.kg at 32 + weeks would link to the Correction scale on the DM pregnancy order set which is the scale under TDD of 95 units, begins with 4 units at cbg, 91- 120 mg/dL.if the glycemic control moderate scale q 4 hrs is not effective to control.)  Will follow and glad to assist in any way)  Thank you Lenor CoffinAnn Blanche Scovell, RN, MSN, CDE  Diabetes Inpatient Program Office: [phone] Pager: [phone] 8:00 am to 5:00 pm

## 2014-10-21 NOTE — Consult Note (Signed)
PULMONARY / CRITICAL CARE MEDICINE   Name: Lauren Sharp MRN: [id number] DOB: Dec 28, 1990    ADMISSION DATE:  10/19/2014 CONSULTATION DATE:  3/21  REFERRING MD :  Meisinger  REASON: acute respiratory distress  PT PROFILE:  17 F admitted 3/20 with pre-eclampsia manifesting as hypertension, HA and proteinuria. She was admitted for magnesium infusion. On the evening of 3/21 she developed severe respiratory distress, pulm edema pattern on CXR and resp acidosis. Her symptoms improved some with diuresis but she remained markedly SOB. PCCM asked to evaluate  STUDIES:    SIGNIFICANT EVENTS:    HISTORY OF PRESENT ILLNESS:   Pt in extremis and unable to provide much further history. She denies CP and hemoptysis  PAST MEDICAL HISTORY :   has a past medical history of Gonorrhea; Bacterial vaginosis; Psoriasis; Vaginal Pap smear, abnormal; and Hypertension.  has past surgical history that includes Tooth extraction and No past surgeries. Prior to Admission medications   Medication Sig Start Date End Date Taking? Authorizing Provider  acetaminophen (TYLENOL) 500 MG tablet Take 1,000 mg by mouth every 6 (six) hours as needed for mild pain or headache.   Yes Historical Provider, MD  calcium carbonate (TUMS - DOSED IN MG ELEMENTAL CALCIUM) 500 MG chewable tablet Chew 1 tablet by mouth as needed for heartburn.    Yes Historical Provider, MD  labetalol (NORMODYNE) 200 MG tablet Take 1 tablet (200 mg total) by mouth 2 (two) times daily. 10/18/14  Yes Huel Cote, MD  potassium chloride (K-DUR,KLOR-CON) 10 MEQ tablet Take 1 tablet (10 mEq total) by mouth 3 (three) times daily. 09/26/14  Yes Lavina Hamman, MD  Prenatal Vit-Fe Fumarate-FA (PRENATAL MULTIVITAMIN) TABS tablet Take 1 tablet by mouth daily at 12 noon.   Yes Historical Provider, MD   No Known Allergies  FAMILY HISTORY:  indicated that her mother is alive. She indicated that her father is deceased. She indicated that her sister is alive.   SOCIAL HISTORY:  reports that she has been smoking Cigarettes.  She has a 10 pack-year smoking history. She has never used smokeless tobacco. She reports that she drinks alcohol. She reports that she does not use illicit drugs.  REVIEW OF SYSTEMS:  N/A  SUBJECTIVE:   VITAL SIGNS: Temp:  [98.1 F (36.7 C)-98.6 F (37 C)] 98.4 F (36.9 C) (03/21 2130) Pulse Rate:  [43-167] 112 (03/22 0130) Resp:  [16-45] 35 (03/22 0130) BP: (117-180)/(71-133) 119/73 mmHg (03/22 0130) SpO2:  [81 %-100 %] 100 % (03/22 0130) FiO2 (%):  [100 %] 100 % (03/21 1230) HEMODYNAMICS:   VENTILATOR SETTINGS: Vent Mode:  [-]  FiO2 (%):  [100 %] 100 % INTAKE / OUTPUT:  Intake/Output Summary (Last 24 hours) at 10/21/14 0208 Last data filed at 10/20/14 2300  Gross per 24 hour  Intake 6127.5 ml  Output   5950 ml  Net  177.5 ml    PHYSICAL EXAMINATION: General:  Lethargic, tachypneic, diaphoretic Neuro: no focal deficits HEENT: NCAT, EOMI Cardiovascular: Tachy, regular, no M noted Lungs: scattered wheezes, bibasilar crackles Abdomen: gravid abdomen, obese, nontender, diminished BS Ext: trace symmetric pretibial edema  LABS:  CBC  Recent Labs Lab 10/19/14 2350 10/20/14 0705 10/21/14 0020  WBC 15.8* 15.9* 36.1*  HGB 12.1 12.1 12.9  HCT 34.4* 34.9* 37.4  PLT 131* 132* 225   Coag's No results for input(s): APTT, INR in the last 168 hours. BMET  Recent Labs Lab 10/19/14 2350 10/20/14 0705 10/21/14 0020  NA 138 137 137  K 3.4* 2.9*  3.8  CL 106 104 106  CO2 24 22 24   BUN 9 7 8   CREATININE 0.48* 0.47* 0.66  GLUCOSE 98 113* 226*   Electrolytes  Recent Labs Lab 10/19/14 2350 10/20/14 0705 10/21/14 0020  CALCIUM 9.6 8.8 8.4  MG  --   --  4.9*   Sepsis Markers No results for input(s): LATICACIDVEN, PROCALCITON, O2SATVEN in the last 168 hours. ABG No results for input(s): PHART, PCO2ART, PO2ART in the last 168 hours. Liver Enzymes  Recent Labs Lab 10/19/14 2350 10/20/14 0705  10/21/14 0020  AST 35 30 32  ALT 19 20 23   ALKPHOS 72 81 108  BILITOT 0.6 0.5 0.8  ALBUMIN 2.9* 2.9* 2.8*   Cardiac Enzymes No results for input(s): TROPONINI, PROBNP in the last 168 hours. Glucose No results for input(s): GLUCAP in the last 168 hours.  CXR: low volumes. Edema pattern  ASSESSMENT / PLAN:  PULMONARY A: Acute respiratory failure Pulmonary edema Wheezing - likely "cardiac asthma" Smoker P:   PRN BiPAP Supp O2 Scheduled nebulized steroids and BDs Short course of systemic steroids Repeat ABG one hour after BiPAP  Assess intubation need @ that time  CARDIOVASCULAR A:  Sinus tachycardia Severe hypertension P:  PRN labetalol to maintain SBP < 160 mmHg  RENAL A:   Mild hypervolemia P:   Monitor BMET intermittently Monitor I/Os Correct electrolytes as indicated Repeat furosemide in 6 hrs after first dose Replete K+ as needed Monitor electrolytes  GASTROINTESTINAL A:   Obesity P:   SUP N/I NPO until risk of intubation abates  HEMATOLOGIC A:  Spurious anemia of pregnancy (mild) P:  DVT px: SQ heparin Monitor CBC intermittently Transfuse per usual guidelines   INFECTIOUS A:   No overt issues P:   Monitor temp, WBC count  ENDOCRINE A:   Stress and steroid induced  P:   SSI ordered - please change to ACHS when diet started  NEUROLOGIC A:  Anxiety P:   RASS goal: 0 Low dose PRN lorazepam  Discussed with Dr Jackelyn KnifeMeisinger 40 mins CCM time  Billy Fischeravid Justyce Baby, MD ; [facility] service Mobile [phone].  After 5:30 PM or weekends, call [phone] Pulmonary and Critical Care Medicine [facility] Pager: [phone]  10/21/2014, 2:08 AM

## 2014-10-21 NOTE — Anesthesia Postprocedure Evaluation (Signed)
  Anesthesia Post-op Note  Patient: Lauren Sharp  Procedure(s) Performed: Procedure(s): CESAREAN SECTION (N/A)  Patient Location: PACU  Anesthesia Type:Spinal  Level of Consciousness: awake, alert  and oriented  Airway and Oxygen Therapy: Patient Spontanous Breathing  Post-op Pain: mild  Post-op Assessment: Post-op Vital signs reviewed, Patient's Cardiovascular Status Stable, Respiratory Function Stable, Patent Airway, No signs of Nausea or vomiting, Pain level controlled, No headache and No backache  Post-op Vital Signs: Reviewed and stable  Last Vitals:  Filed Vitals:   10/21/14 1715  BP: 138/89  Pulse: 92  Temp:   Resp: 18    Complications: No apparent anesthesia complications

## 2014-10-21 NOTE — Progress Notes (Signed)
Radiology here CXR done.

## 2014-10-21 NOTE — Progress Notes (Signed)
Inpatient Diabetes Program Recommendations  Diabetes Treatment Program Recommendations  ADA Standards of Care 2016 Diabetes in Pregnancy Target Glucose Ranges:  Fasting: 60 - 90 mg/dL Preprandial: 60 - 102105 mg/dL 1 hr postprandial: Less than 140mg /dL (from first bite of meal) 2 hr postprandial: Less than 120 mg/dL (from first bit of meal)   Reviewing glucose trend as cbg's  while on solumedrol. If patient becomes hyperglycemic and correction insulin is not controlling, please consider using the IV drip per GS. (In AICU the settings/setup is programmed for the non-pregnant adult). The only change in setup would need to adjust the target ranges from 140-180 as presently set for non-pregnant patients-change the target ranges to 90-120 mg/dL in the set-up screen. Please call pager at [phone] for questioons/assistance. Ask for Beryl MeagerJenny Simpson for assistance tomorrow or myself on Thursday.Thank you Lenor CoffinAnn Dayvon Dax, RN, MSN, CDE   Diabetes Inpatient Program Office: [phone] Pager: [phone] 8:00 am to 5:00 pm

## 2014-10-21 NOTE — Progress Notes (Signed)
[facility]

## 2014-10-21 NOTE — Progress Notes (Signed)
Patient ID: Lauren LynnRikki Ghee, female   DOB: 09/07/1990, 24 y.o.   MRN: [id number] I have spoken to MFM and Dr. Sherrie Georgeecker recommends a c section. I have informed the pt and she agrees. Her BP seems to be controlled now Dr. Sherrie Georgeecker recommends not resuming magnesium sulfate but keeping her on dilantin 100 mg q 8h.

## 2014-10-21 NOTE — Anesthesia Procedure Notes (Signed)
Spinal Patient location during procedure: OR Start time: 10/21/2014 3:16 PM Staffing Anesthesiologist: Mal AmabileFOSTER, Rodneshia Greenhouse Performed by: anesthesiologist  Preanesthetic Checklist Completed: patient identified, site marked, surgical consent, pre-op evaluation, timeout performed, IV checked, risks and benefits discussed and monitors and equipment checked Spinal Block Patient position: sitting Prep: site prepped and draped and DuraPrep Patient monitoring: heart rate, cardiac monitor, continuous pulse ox and blood pressure Approach: midline Location: L3-4 Injection technique: single-shot Needle Needle type: Sprotte  Needle gauge: 24 G Needle length: 9 cm Needle insertion depth: 7 cm Assessment Sensory level: T4 Additional Notes Patient tolerated procedure well. Adequate sensory level.

## 2014-10-21 NOTE — Progress Notes (Signed)
FHT prior to respiratory distress 130s, mod variability, + accels, no decels-Cat I Since then FHT 140s, moderate variability, no accels, no decels-Cat II Lactic acid 2.1, acidosis probably contributing to [facility] LLPFHT Since there is moderate variability and no decels, will continue to monitor closely

## 2014-10-21 NOTE — Progress Notes (Signed)
eLink Physician-Brief Progress Note Patient Name: Edythe LynnRikki Feeley DOB: 02/01/1991 MRN: [id number]   Date of Service  10/21/2014  HPI/Events of Note  Call requesting PCCM assistance with patient in resp distress.  [redacted] week gestation admitted with pre-eclampsia on mag gtt along with labetalol.  Now with wheezing, resp distress, tachycardia and hypertension.  Camera check shows patient to be on NRB with RR in the high 30s to 40s, BP of 168/110 (124) and HR in the 120s to 160s.  PCXR shows pulm congestion.  ABG 7.24/57/89.  Patient is alert and protecting airway.  Lasix 20 mg and nebs have been ordered.  eICU Interventions  Plan" PCCM consult  BiPAP as bridge KVO IVFs Consider morphine 2 mg IV times one     Intervention Category Intermediate Interventions: Respiratory distress - evaluation and management  DETERDING,ELIZABETH 10/21/2014, 12:41 AM

## 2014-10-22 ENCOUNTER — Encounter (HOSPITAL_COMMUNITY): Payer: Self-pay | Admitting: Obstetrics and Gynecology

## 2014-10-22 LAB — CBC
HCT: 27.3 % — ABNORMAL LOW (ref 36.0–46.0)
Hemoglobin: 9.4 g/dL — ABNORMAL LOW (ref 12.0–15.0)
MCH: 29.6 pg (ref 26.0–34.0)
MCHC: 34.4 g/dL (ref 30.0–36.0)
MCV: 85.8 fL (ref 78.0–100.0)
PLATELETS: 161 10*3/uL (ref 150–400)
RBC: 3.18 MIL/uL — AB (ref 3.87–5.11)
RDW: 14.8 % (ref 11.5–15.5)
WBC: 14.2 10*3/uL — ABNORMAL HIGH (ref 4.0–10.5)

## 2014-10-22 LAB — COMPREHENSIVE METABOLIC PANEL
ALT: 14 U/L (ref 0–35)
AST: 20 U/L (ref 0–37)
Albumin: 2.3 g/dL — ABNORMAL LOW (ref 3.5–5.2)
Alkaline Phosphatase: 64 U/L (ref 39–117)
Anion gap: 7 (ref 5–15)
BUN: 22 mg/dL (ref 6–23)
CALCIUM: 8.9 mg/dL (ref 8.4–10.5)
CHLORIDE: 104 mmol/L (ref 96–112)
CO2: 24 mmol/L (ref 19–32)
Creatinine, Ser: 0.7 mg/dL (ref 0.50–1.10)
GFR calc non Af Amer: >90 mL/min (ref >90)
Glucose, Bld: 95 mg/dL (ref 70–99)
Potassium: 3.1 mmol/L — ABNORMAL LOW (ref 3.5–5.1)
Sodium: 135 mmol/L (ref 135–145)
Total Bilirubin: 0.3 mg/dL (ref 0.3–1.2)
Total Protein: 4.8 g/dL — ABNORMAL LOW (ref 6.0–8.3)

## 2014-10-22 LAB — GLUCOSE, CAPILLARY
GLUCOSE-CAPILLARY: 98 mg/dL (ref 70–99)
Glucose-Capillary: 91 mg/dL (ref 70–99)
Glucose-Capillary: 70 mg/dL (ref 70–99)

## 2014-10-22 MED ORDER — POTASSIUM CHLORIDE CRYS ER 20 MEQ PO TBCR
40.0000 meq | EXTENDED_RELEASE_TABLET | Freq: Every day | ORAL | Status: DC
  Administered 2014-10-23: 40 meq via ORAL
  Filled 2014-10-22 (×2): qty 2

## 2014-10-22 MED ORDER — POTASSIUM CHLORIDE CRYS ER 20 MEQ PO TBCR
40.0000 meq | EXTENDED_RELEASE_TABLET | ORAL | Status: AC
  Administered 2014-10-22: 40 meq via ORAL
  Filled 2014-10-22 (×2): qty 2

## 2014-10-22 NOTE — Progress Notes (Signed)
Ur chart review completed.  

## 2014-10-22 NOTE — Anesthesia Postprocedure Evaluation (Signed)
  Anesthesia Post-op Note  Patient: Lauren Sharp  Procedure(s) Performed: * No procedures listed *  Patient Location: A-ICU  Anesthesia Type:Spinal  Level of Consciousness: awake, alert , oriented and patient cooperative  Airway and Oxygen Therapy: Patient Spontanous Breathing and Patient connected to nasal cannula oxygen  Post-op Pain: mild  Post-op Assessment: Post-op Vital signs reviewed, Patient's Cardiovascular Status Stable, Respiratory Function Stable, Patent Airway, No signs of Nausea or vomiting, Adequate PO intake, Pain level controlled, No headache, No residual numbness and No residual motor weakness  Post-op Vital Signs: Reviewed and stable  Last Vitals:  Filed Vitals:   10/22/14 0803  BP:   Pulse: 86  Temp: 36.7 C  Resp:     Complications: No apparent anesthesia complications

## 2014-10-22 NOTE — Progress Notes (Signed)
Clinical Social Work Department PSYCHOSOCIAL ASSESSMENT - MATERNAL/CHILD 10/22/2014  Patient:  Lauren Sharp, Lauren Sharp  Account Number:  [id number]  [place] Date:  10/19/2014  Ardine Eng Name:   Alinda Deem    Clinical Social Worker:  Terri Piedra, LCSW   Date/Time:  10/22/2014 11:00 AM  Date Referred:        Other referral source:   No referral-NICU admission    I:  FAMILY / [place] legal guardian:  PARENT  Guardian - Name Guardian - Age Guardian - Address  [facility] [address] [place], [place] [address]  John Moon  same   Other household support members/support persons Other support:   [name] states that her mother is a good support for her and lives nearby.    II  PSYCHOSOCIAL DATA Information Source:  Patient Interview  Occupational hygienist Employment:   [name] works at [employer] "builds Gaffer resources:  [employer] [place]:  [employer] / Grade:   Maternity Care Coordinator / Child Services Coordination / Early Interventions:  Cultural issues impacting care:   None stated    III  STRENGTHS Strengths  Adequate Resources  Compliance with medical plan  Home prepared for Child (including basic supplies)  Supportive family/friends  Understanding of illness   Strength comment:  [name] will obtain a pediatician list from NICU RN statin.   IV  RISK FACTORS AND CURRENT PROBLEMS Current Problem:  None   Risk Factor & Current Problem Patient Issue Family Issue Risk Factor / Current Problem Comment   N N     V  SOCIAL WORK ASSESSMENT  CSW met with [name] in her third floor room/310 to introduce myself, offer support and complete assessment due to baby's admission to NICU at 33 weeks.  [name] welcomed CSW's visit, however, stated that she was "tired," when asked how she is feeling.  She states she had some indication that she may have a preterm delivery because she was experiencing high blood pressure, but admits  that she is not sure it has really set in yet that baby is here and that she is a mother.  She reports that she has not been able to spend much time yet with baby because of her own health issues and looks forward to being able to spend more time with him as she gets better.  She states her mother is coming soon and will help her visit baby in NICU.  She reports she and FOB are in a relationship and live together.  She reports that he is supportive.  They also have a roommate, but [name] states that he is not home much.  She states she has the main supplies for baby and that she will be rescheduling her baby shower, which was planned for this Saturday.   CSW discussed common emotions related to the NICU experience as well as PPD signs and symptoms to watch for.  [name] reports no emotional concerns at this time, however, seemed tired and to not be feeling very well physically, making it difficult to discuss emotions at this time.  CSW will monitor.  She states no questions regarding baby's care in the NICU and states feeling comfortable with care so far.  CSW explained ongoing support services offered by NICU CSW and gave contact information.  [name] thanked CSW.     VI SOCIAL WORK PLAN Social Work Plan  Psychosocial Support/Ongoing Assessment of Needs  Patient/Family Education  Type of pt/family education:   PPD signs and symptoms  Ongoing support services offered by NICU CSW   If child protective services report - county:   If child protective services report - date:   Information/referral to community resources comment:   No referral needs noted at this time.   Other social work plan:

## 2014-10-22 NOTE — Lactation Note (Signed)
This note was copied from the chart of Lauren Sharp. Lactation Consultation Note  Initial visit made.  Symphony pump set up at bedside.  Mom states she has pumped once but she is not sure if she wants to continue.  Discussed benefits and encouraged to provide milk while baby is in the NICU.  She is currently unsure.  Instructed to ask for assist prn.  Patient Name: Lauren Edythe LynnRikki Vancuren ZOXWR'UToday's Date: 10/22/2014 Reason for consult: Initial assessment;NICU baby   Maternal Data    Feeding Feeding Type: Formula  LATCH Score/Interventions                      Lactation Tools Discussed/Used     Consult Status Consult Status: Follow-up Date: 10/23/14 Follow-up type: In-patient    Huston FoleyMOULDEN, Daun Rens S 10/22/2014, 4:02 PM

## 2014-10-22 NOTE — Progress Notes (Signed)
Upon arrival to pts room she stated that she didn't need the treatment because she was breathing fine now, only had a cough,  and just wanted to get some sleep.  I advised that I would assess her and suggested to go ahead and take the treatment now and I would call the physician and discuss discontinuing today.  I noted no wheezing, just fine crackles in upper lobes. She took the treatment. Her cough is dry and non-productive.  Will follow.

## 2014-10-22 NOTE — Progress Notes (Addendum)
Patient ID: Lauren Sharp, female   DOB: 03/11/1991, 24 y.o.   MRN: [id number] #1 afebrile BP 136/92 HGB stable. PIH labs are normal K+ 3.1 Will continue dilantin, add potassium and transfer to a regular room oUTPUT IS INCREASING She was dry before surgery with NPO and diuretics

## 2014-10-23 ENCOUNTER — Inpatient Hospital Stay (HOSPITAL_COMMUNITY): Payer: Medicaid Other

## 2014-10-23 LAB — COMPREHENSIVE METABOLIC PANEL
ALBUMIN: 2.4 g/dL — AB (ref 3.5–5.2)
ALT: 14 U/L (ref 0–35)
AST: 21 U/L (ref 0–37)
Alkaline Phosphatase: 70 U/L (ref 39–117)
Anion gap: 7 (ref 5–15)
BUN: 19 mg/dL (ref 6–23)
CALCIUM: 9.2 mg/dL (ref 8.4–10.5)
CO2: 26 mmol/L (ref 19–32)
CREATININE: 0.6 mg/dL (ref 0.50–1.10)
Chloride: 106 mmol/L (ref 96–112)
GFR calc Af Amer: >90 mL/min (ref >90)
GFR calc non Af Amer: >90 mL/min (ref >90)
Glucose, Bld: 78 mg/dL (ref 70–99)
Potassium: 3.3 mmol/L — ABNORMAL LOW (ref 3.5–5.1)
Sodium: 139 mmol/L (ref 135–145)
Total Bilirubin: 0.2 mg/dL — ABNORMAL LOW (ref 0.3–1.2)
Total Protein: 4.9 g/dL — ABNORMAL LOW (ref 6.0–8.3)

## 2014-10-23 LAB — CBC
HCT: 26.5 % — ABNORMAL LOW (ref 36.0–46.0)
Hemoglobin: 9.1 g/dL — ABNORMAL LOW (ref 12.0–15.0)
MCH: 29.7 pg (ref 26.0–34.0)
MCHC: 34.3 g/dL (ref 30.0–36.0)
MCV: 86.6 fL (ref 78.0–100.0)
PLATELETS: 136 10*3/uL — AB (ref 150–400)
RBC: 3.06 MIL/uL — AB (ref 3.87–5.11)
RDW: 14.9 % (ref 11.5–15.5)
WBC: 8.5 10*3/uL (ref 4.0–10.5)

## 2014-10-23 LAB — BLOOD GAS, ARTERIAL
Acid-Base Excess: 0.6 mmol/L (ref 0.0–2.0)
BICARBONATE: 22.9 meq/L (ref 20.0–24.0)
Drawn by: 153
FIO2: 2 %
O2 SAT: 97 %
PCO2 ART: 30.3 mmHg — AB (ref 35.0–45.0)
PO2 ART: 70.8 mmHg — AB (ref 80.0–100.0)
TCO2: 23.8 mmol/L (ref 0–100)
pH, Arterial: 7.491 — ABNORMAL HIGH (ref 7.350–7.450)

## 2014-10-23 MED ORDER — LABETALOL HCL 300 MG PO TABS
300.0000 mg | ORAL_TABLET | Freq: Two times a day (BID) | ORAL | Status: DC
  Administered 2014-10-23 – 2014-10-25 (×5): 300 mg via ORAL
  Filled 2014-10-23 (×6): qty 1

## 2014-10-23 MED ORDER — RHO D IMMUNE GLOBULIN 1500 UNIT/2ML IJ SOSY
300.0000 ug | PREFILLED_SYRINGE | Freq: Once | INTRAMUSCULAR | Status: AC
  Administered 2014-10-23: 300 ug via INTRAMUSCULAR
  Filled 2014-10-23: qty 2

## 2014-10-23 MED ORDER — NICARDIPINE HCL IN DEXTROSE 20-4.8 MG/200ML-% IV SOLN
0.0000 mg/h | INTRAVENOUS | Status: DC
  Filled 2014-10-23 (×6): qty 200

## 2014-10-23 MED ORDER — LORAZEPAM 2 MG/ML IJ SOLN
0.5000 mg | INTRAMUSCULAR | Status: DC | PRN
  Administered 2014-10-25 – 2014-10-27 (×3): 1 mg via INTRAVENOUS
  Filled 2014-10-23 (×5): qty 0.5
  Filled 2014-10-23: qty 1

## 2014-10-23 MED ORDER — FUROSEMIDE 10 MG/ML IJ SOLN
20.0000 mg | Freq: Once | INTRAMUSCULAR | Status: AC
  Administered 2014-10-23: 20 mg via INTRAVENOUS
  Filled 2014-10-23: qty 2

## 2014-10-23 MED ORDER — RHO D IMMUNE GLOBULIN 15000 UNIT/13ML IJ SOLN: 300.0000 ug | Freq: Once | INTRAMUSCULAR | Status: DC

## 2014-10-23 MED ORDER — HYDRALAZINE HCL 20 MG/ML IJ SOLN
10.0000 mg | Freq: Once | INTRAMUSCULAR | Status: AC
  Administered 2014-10-23: 10 mg via INTRAVENOUS

## 2014-10-23 MED ORDER — NICARDIPINE HCL IN NACL 20-0.86 MG/200ML-% IV SOLN: 0.0000 mg/h | INTRAVENOUS | Status: DC

## 2014-10-23 MED ORDER — LACTATED RINGERS IV SOLN
INTRAVENOUS | Status: DC
  Administered 2014-10-23: 21:00:00 via INTRAVENOUS

## 2014-10-23 MED ORDER — HYDRALAZINE HCL 20 MG/ML IJ SOLN
5.0000 mg | Freq: Once | INTRAMUSCULAR | Status: AC
  Administered 2014-10-23: 5 mg via INTRAVENOUS
  Filled 2014-10-23: qty 1

## 2014-10-23 MED ORDER — NICARDIPINE HCL IN NACL 20-0.86 MG/200ML-% IV SOLN
0.0000 mg/h | INTRAVENOUS | Status: DC
  Administered 2014-10-23: 5 mg/h via INTRAVENOUS
  Administered 2014-10-23: 15 mg/h via INTRAVENOUS
  Administered 2014-10-24: 5 mg/h via INTRAVENOUS
  Filled 2014-10-23 (×4): qty 200

## 2014-10-23 MED ORDER — METOPROLOL TARTRATE 1 MG/ML IV SOLN
2.5000 mg | INTRAVENOUS | Status: DC | PRN
  Administered 2014-10-23: 2.5 mg via INTRAVENOUS
  Filled 2014-10-23 (×3): qty 5

## 2014-10-23 MED ORDER — HYDRALAZINE HCL 20 MG/ML IJ SOLN
10.0000 mg | INTRAMUSCULAR | Status: DC | PRN
  Filled 2014-10-23: qty 1

## 2014-10-23 NOTE — Progress Notes (Signed)
Patient ID: Lauren Sharp, female   DOB: 03/04/1991, 24 y.o.   MRN: [id number] Pt returned from NICU this evening and began to feel bad with a heavy chest and SOB Vital signs were checked and BP noted to be markedly elevated again to 187/130.  Her O2 sat was 94% on room air. She was given labetalol 300mg  po and lasix 20mg  IV with minimal improvement in her SOB. O2 sats dropped to about 90%, BP still elevated at 174/125 and hydralazine 5mg  IV given  ABG and CXR ordered D/w anesthesia and we decided to move pt back to the ICU where she could be more closely monitored. I consulted with Dr. Sung AmabileSimonds and he recommended another 10 mg of hydralazine IV.   We moved her to the ICU, and medication given. We will f/u the CXR and ABG when available.

## 2014-10-23 NOTE — Progress Notes (Signed)
Chaplain visited with concerned family members in the waiting room of the Adult ICU. Lauren Sharp had returned to the hospital days after giving birth to a premature child. Lauren Sharp' attending physician asked that the family be told that her status was stable and likely will be okay medically. Chaplain passed on this news and had a prayer.  Suggest chaplain follow-up in the AM of 24 October 2014.  Benjie Karvonenharles D. Zyhir Cappella, DMin, MDiv  Chaplain

## 2014-10-23 NOTE — Progress Notes (Signed)
eLink Physician-Brief Progress Note Patient Name: Lauren LynnRikki Sharp DOB: 11/30/1990 MRN: [id number]   Date of Service  10/23/2014  HPI/Events of Note  Pt well known to me. Transferred back to AICU with severe hypertension and severe anxiety. I have discussed with Dr Senaida Oresichardson  eICU Interventions  PRN hydralazine to maintain SBP < 160 PRN metoprolol to maintain HR < 115/min Nicardipine gtt to maintain SBP 140-160 mmHg if above fails Low dsoe lorazepam eLink to monitor closely     Intervention Category Major Interventions: Hypertension - evaluation and management;Respiratory failure - evaluation and management  Billy FischerDavid Wilba Mutz 10/23/2014, 8:32 PM

## 2014-10-23 NOTE — Progress Notes (Signed)
Patient ID: Lauren LynnRikki Murgia, female   DOB: 08/13/1990, 24 y.o.   MRN: [id number] Pt in ICU and feeling some better BP improved to 169/113 UOP already at 1 liter CXR c/w pulmonary edema and ABG with pO2 at 70. O2 sat improved to 97-99%  [facility] BP WIll diurese as needed, still responding well to lasix.

## 2014-10-23 NOTE — Progress Notes (Signed)
Patient ID: Lauren Sharp, female   DOB: 11/30/1990, 24 y.o.   MRN: [id number] #2 afebrile BP slightly elevbated Will increase labetalol. She is tolerating a diet and passing some flatus. Her abdomen is soft but upper abdomen is slightly distended. Her dilantin was d/c ed

## 2014-10-23 NOTE — Progress Notes (Signed)
1820 BP 174/130 Dr. Senaida Oresichardson  Was called concerning Vital signs. !837 Labetolol 300mg  was given as order.  I.V. Lasix 20 mg given,at 1922, Ativan given at 1930 p.m. P.o.Dr.Richardson in and pt was later assessed.

## 2014-10-24 LAB — CBC
HCT: 31.3 % — ABNORMAL LOW (ref 36.0–46.0)
Hemoglobin: 10.7 g/dL — ABNORMAL LOW (ref 12.0–15.0)
MCH: 29.4 pg (ref 26.0–34.0)
MCHC: 34.2 g/dL (ref 30.0–36.0)
MCV: 86 fL (ref 78.0–100.0)
PLATELETS: 159 10*3/uL (ref 150–400)
RBC: 3.64 MIL/uL — ABNORMAL LOW (ref 3.87–5.11)
RDW: 14.8 % (ref 11.5–15.5)
WBC: 10.2 10*3/uL (ref 4.0–10.5)

## 2014-10-24 LAB — TYPE AND SCREEN
ABO/RH(D): O NEG
ANTIBODY SCREEN: POSITIVE
DAT, IgG: NEGATIVE
UNIT DIVISION: 0
Unit division: 0

## 2014-10-24 LAB — COMPREHENSIVE METABOLIC PANEL
ALT: 17 U/L (ref 0–35)
AST: 27 U/L (ref 0–37)
Albumin: 2.7 g/dL — ABNORMAL LOW (ref 3.5–5.2)
Alkaline Phosphatase: 87 U/L (ref 39–117)
Anion gap: 8 (ref 5–15)
BUN: 9 mg/dL (ref 6–23)
CALCIUM: 9.6 mg/dL (ref 8.4–10.5)
CHLORIDE: 104 mmol/L (ref 96–112)
CO2: 27 mmol/L (ref 19–32)
Creatinine, Ser: 0.51 mg/dL (ref 0.50–1.10)
GFR calc Af Amer: >90 mL/min (ref >90)
GFR calc non Af Amer: >90 mL/min (ref >90)
Glucose, Bld: 98 mg/dL (ref 70–99)
Potassium: 3 mmol/L — ABNORMAL LOW (ref 3.5–5.1)
Sodium: 139 mmol/L (ref 135–145)
TOTAL PROTEIN: 5.5 g/dL — AB (ref 6.0–8.3)
Total Bilirubin: 0.2 mg/dL — ABNORMAL LOW (ref 0.3–1.2)

## 2014-10-24 LAB — RH IG WORKUP (INCLUDES ABO/RH)
ABO/RH(D): O NEG
FETAL SCREEN: NEGATIVE
GESTATIONAL AGE(WKS): 33
Unit division: 0

## 2014-10-24 MED ORDER — HYDRALAZINE HCL 20 MG/ML IJ SOLN
10.0000 mg | INTRAMUSCULAR | Status: DC | PRN
  Administered 2014-10-24 (×2): 10 mg via INTRAVENOUS
  Filled 2014-10-24 (×3): qty 1

## 2014-10-24 MED ORDER — POTASSIUM CHLORIDE CRYS ER 20 MEQ PO TBCR
20.0000 meq | EXTENDED_RELEASE_TABLET | Freq: Three times a day (TID) | ORAL | Status: DC
  Administered 2014-10-24 – 2014-10-27 (×12): 20 meq via ORAL
  Filled 2014-10-24 (×16): qty 1

## 2014-10-24 MED ORDER — LORAZEPAM 1 MG PO TABS
1.0000 mg | ORAL_TABLET | Freq: Three times a day (TID) | ORAL | Status: DC | PRN
  Administered 2014-10-24 – 2014-10-25 (×3): 1 mg via ORAL
  Filled 2014-10-24 (×4): qty 1

## 2014-10-24 MED ORDER — NICARDIPINE HCL 2.5 MG/ML IV SOLN
INTRAVENOUS | Status: DC
  Administered 2014-10-24: 06:00:00 via INTRAVENOUS
  Filled 2014-10-24 (×2): qty 200

## 2014-10-24 MED ORDER — LABETALOL HCL 5 MG/ML IV SOLN
20.0000 mg | INTRAVENOUS | Status: DC | PRN
  Administered 2014-10-24 – 2014-10-25 (×4): 20 mg via INTRAVENOUS
  Filled 2014-10-24 (×7): qty 4

## 2014-10-24 NOTE — Progress Notes (Addendum)
Foley inserted with immediate return clear urine, chest xray (pulmonary edema) completed and ABG drawn per orders.  Consult with CCM per Dr. Senaida Oresichardson re POC.

## 2014-10-24 NOTE — Lactation Note (Signed)
This note was copied from the chart of Lauren Kamaree Glance. Lactation Consultation Note  Patient Name: Lauren Edythe LynnRikki Altidor WUJWJ'XToday's Date: 10/24/2014 Reason for consult: Follow-up assessment   With this mom of a NICU baby, now 70 hours post partum, and in AICU with pulmonary edema  Mom does not want to pump, but was interested in hand expressing colostrum while in the hospital. I assisted mom with this, and was able to express about .5-1 ml of colostrum, which I brought to the nICU for the baby. I left the 11 ml snappies with mom to express into, and gave dad the breast milk labels to give to mom. Mom knows to call for questions/concerns.   Maternal Data    Feeding Feeding Type: Formula Length of feed: 30 min  LATCH Score/Interventions                      Lactation Tools Discussed/Used     Consult Status Consult Status: PRN Follow-up type: Call as needed    Lauren Sharp, Lauren Sharp 10/24/2014, 5:57 PM

## 2014-10-24 NOTE — Progress Notes (Signed)
Subjective: Postpartum Day #3: Cesarean Delivery Patient reports incisional pain and tolerating PO, feeling better as far as SOB.   Objective: Vital signs in last 24 hours: Temp:  [97.8 F (36.6 C)-98.1 F (36.7 C)] 97.8 F (36.6 C) (03/25 0010) Pulse Rate:  [60-137] 106 (03/25 0904) Resp:  [15-35] 26 (03/25 0904) BP: (122-187)/(76-130) 141/96 mmHg (03/25 0904) SpO2:  [91 %-100 %] 97 % (03/25 0904) Weight:  [97.705 kg (215 lb 6.4 oz)-99.791 kg (220 lb)] 97.705 kg (215 lb 6.4 oz) (03/25 0515)  Physical Exam:  General: alert Lochia: appropriate Uterine Fundus: firm Incision: dressing C/D/I   Recent Labs  10/22/14 0527 10/23/14 0835  HGB 9.4* 9.1*  HCT 27.3* 26.5*    Assessment/Plan: Status post Cesarean section. Postoperative course complicated by recurrent pulmonary edema and significant hypertension, BP ok currently on Nicardipine gtt, CCM following.  Good diuresis with Lasix, will need to closely monitor I/O. Continue current care, will check labs and continue PO K+.  Biance Moncrief D 10/24/2014, 9:20 AM

## 2014-10-25 MED ORDER — LABETALOL HCL 200 MG PO TABS
400.0000 mg | ORAL_TABLET | Freq: Two times a day (BID) | ORAL | Status: DC
  Administered 2014-10-25: 400 mg via ORAL
  Filled 2014-10-25: qty 2

## 2014-10-25 NOTE — Progress Notes (Signed)
Called to pt room pt sitting up in the bed rocking back and forth c/o mid sternal CP, and diaphoretic. States she thinks she is having an anxiety attack. VS collected see flow sheet. All lobes clear to auscultation and HR rate 138.  Leonie ManKatri Saunders RN made aware and at bedside. Will continue to monitor.

## 2014-10-25 NOTE — Progress Notes (Addendum)
Pt c/o being "anxious". Reports diaphoresis and "feeling like I need to take a deep breath". Emotional support provided and 1mg  Ativan given po. VSS. Will continue to monitor in AICU for now.

## 2014-10-25 NOTE — Progress Notes (Signed)
Subjective: Postpartum Day #4: Cesarean Delivery Patient reports incisional pain and tolerating PO, breathing ok, occasional lateral chest pain  Objective: Vital signs in last 24 hours: Temp:  [98.1 F (36.7 C)-98.3 F (36.8 C)] 98.3 F (36.8 C) (03/26 0349) Pulse Rate:  [90-127] 109 (03/26 0959) Resp:  [11-34] 20 (03/26 0959) BP: (121-166)/(76-117) 149/110 mmHg (03/26 0959) SpO2:  [94 %-100 %] 99 % (03/26 0959) UOP-3+ liters in the past 24 hours, 11+ leters in the past 48 hours  Physical Exam:  General: alert Lochia: appropriate Uterine Fundus: firm Incision: dressing C/D/I Lungs with a few crackles at bases only   Recent Labs  10/23/14 0835 10/24/14 1000  HGB 9.1* 10.7*  HCT 26.5* 31.3*    Assessment/Plan: Status post Cesarean section. Doing well postoperatively. Preeclampsia with pulmonary edema improving  Will transfer to the floor and continue close monitoring, continue Labetalol 300 mg po bid.  Dolan Xia D 10/25/2014, 10:17 AM

## 2014-10-26 ENCOUNTER — Inpatient Hospital Stay (HOSPITAL_COMMUNITY): Payer: Medicaid Other

## 2014-10-26 LAB — COMPREHENSIVE METABOLIC PANEL
ALT: 15 U/L (ref 0–35)
AST: 21 U/L (ref 0–37)
Albumin: 2.9 g/dL — ABNORMAL LOW (ref 3.5–5.2)
Alkaline Phosphatase: 86 U/L (ref 39–117)
Anion gap: 9 (ref 5–15)
BUN: 22 mg/dL (ref 6–23)
CO2: 21 mmol/L (ref 19–32)
Calcium: 9.5 mg/dL (ref 8.4–10.5)
Chloride: 108 mmol/L (ref 96–112)
Creatinine, Ser: 0.6 mg/dL (ref 0.50–1.10)
GLUCOSE: 124 mg/dL — AB (ref 70–99)
POTASSIUM: 4.1 mmol/L (ref 3.5–5.1)
Sodium: 138 mmol/L (ref 135–145)
Total Bilirubin: 0.2 mg/dL — ABNORMAL LOW (ref 0.3–1.2)
Total Protein: 5.9 g/dL — ABNORMAL LOW (ref 6.0–8.3)

## 2014-10-26 MED ORDER — LABETALOL HCL 5 MG/ML IV SOLN
20.0000 mg | Freq: Once | INTRAVENOUS | Status: AC
  Administered 2014-10-26: 20 mg via INTRAVENOUS
  Filled 2014-10-26: qty 4

## 2014-10-26 MED ORDER — FUROSEMIDE 20 MG PO TABS: 10.0000 mg | ORAL_TABLET | Freq: Every day | ORAL | Status: DC

## 2014-10-26 MED ORDER — FUROSEMIDE 20 MG PO TABS
10.0000 mg | ORAL_TABLET | Freq: Every day | ORAL | Status: DC
  Administered 2014-10-27: 10 mg via ORAL
  Filled 2014-10-26 (×2): qty 0.5

## 2014-10-26 MED ORDER — LABETALOL HCL 5 MG/ML IV SOLN
40.0000 mg | INTRAVENOUS | Status: DC | PRN
  Administered 2014-10-26 – 2014-10-27 (×5): 40 mg via INTRAVENOUS
  Filled 2014-10-26 (×7): qty 8

## 2014-10-26 MED ORDER — LABETALOL HCL 300 MG PO TABS
300.0000 mg | ORAL_TABLET | Freq: Three times a day (TID) | ORAL | Status: DC
  Administered 2014-10-26 – 2014-10-27 (×4): 300 mg via ORAL
  Filled 2014-10-26: qty 1.5
  Filled 2014-10-26: qty 1

## 2014-10-26 MED ORDER — FUROSEMIDE 20 MG PO TABS
10.0000 mg | ORAL_TABLET | Freq: Once | ORAL | Status: AC
  Administered 2014-10-26: 10 mg via ORAL
  Filled 2014-10-26: qty 0.5

## 2014-10-26 MED ORDER — LORAZEPAM 1 MG PO TABS
1.0000 mg | ORAL_TABLET | Freq: Three times a day (TID) | ORAL | Status: DC
  Administered 2014-10-26 – 2014-10-27 (×4): 1 mg via ORAL
  Filled 2014-10-26 (×5): qty 1

## 2014-10-26 MED ORDER — LABETALOL HCL 5 MG/ML IV SOLN
40.0000 mg | Freq: Once | INTRAVENOUS | Status: AC
  Administered 2014-10-26: 40 mg via INTRAVENOUS

## 2014-10-26 NOTE — Progress Notes (Signed)
Talked to Dr Jackelyn KnifeMeisinger during the night concerning patient.Patient called out around 2330 and was sitting up in bed rocking stated her chest hurt,she was short of breath,and thought she was having a panic attack.Patient ashen R-28.BP-129/101,P-138,O2 SAT 91.O2 at 3L started via Nasal cannula.Patient not taking good deep breaths and is diaphoretic.Dr Jackelyn KnifeMeisinger was made aware of this and while in room with patient Bp's continued to rise.Labetalol 20 mg IV and 1 mg of Ativan IV was given and after a second dose of IV Labetalol patient eventually improved.(see frequent vital signs)Patient slept for a while without visible distress and was checked frequently.This same scenerio repeated itself and Dr Jackelyn KnifeMeisinger was notified again.Order to give more Labetalol and more Ativan IV was given.Stayed with patient until bp started to decrease and SOB improved.

## 2014-10-26 NOTE — Progress Notes (Signed)
POD #5 Currently feeling a little SOB.  Had 2 episodes overnight again with elevated BP and SOB-resolved with IV Labetalol to control BP. Afeb, VSS, BP labile up to 160/100 Adequate UOP-1475cc yesterday, down 6 liters overall Lungs-overall clear, possibly a few crackles at bases Abd- soft, fundus firm, dressing C/D/I She is having recurrent episodes of elevated BP associated with SOB, difficult to tell if anxiety is involved.  Will increase scheduled Labetalol to 300 mg tid, continue 40 mg IV prn, will get PA & lat CXR, will give daily lasix 10 mg, will give ativan tid scheduled to try to reduce anxiety.  She is not stable enough for discharge home yet, need to get her BP under better control and reduce her episodes of SOB.

## 2014-10-27 ENCOUNTER — Inpatient Hospital Stay (HOSPITAL_COMMUNITY): Payer: Medicaid Other

## 2014-10-27 DIAGNOSIS — R590 Localized enlarged lymph nodes: Secondary | ICD-10-CM | POA: Insufficient documentation

## 2014-10-27 DIAGNOSIS — R0602 Shortness of breath: Secondary | ICD-10-CM

## 2014-10-27 DIAGNOSIS — O903 Peripartum cardiomyopathy: Secondary | ICD-10-CM | POA: Diagnosis present

## 2014-10-27 DIAGNOSIS — J9690 Respiratory failure, unspecified, unspecified whether with hypoxia or hypercapnia: Secondary | ICD-10-CM | POA: Diagnosis present

## 2014-10-27 DIAGNOSIS — J9601 Acute respiratory failure with hypoxia: Secondary | ICD-10-CM | POA: Diagnosis present

## 2014-10-27 DIAGNOSIS — I5021 Acute systolic (congestive) heart failure: Secondary | ICD-10-CM | POA: Diagnosis present

## 2014-10-27 LAB — PHOSPHORUS: PHOSPHORUS: 4.1 mg/dL (ref 2.3–4.6)

## 2014-10-27 LAB — MRSA PCR SCREENING: MRSA by PCR: NEGATIVE

## 2014-10-27 LAB — PROCALCITONIN: Procalcitonin: <0.1 ng/mL

## 2014-10-27 LAB — LACTIC ACID, PLASMA: LACTIC ACID, VENOUS: 1.7 mmol/L (ref 0.5–2.0)

## 2014-10-27 LAB — BRAIN NATRIURETIC PEPTIDE: B Natriuretic Peptide: 2719.1 pg/mL — ABNORMAL HIGH (ref 0.0–100.0)

## 2014-10-27 LAB — MAGNESIUM: Magnesium: 1.7 mg/dL (ref 1.5–2.5)

## 2014-10-27 MED ORDER — RAMIPRIL 2.5 MG PO CAPS
2.5000 mg | ORAL_CAPSULE | Freq: Every day | ORAL | Status: DC
  Administered 2014-10-27: 2.5 mg via ORAL
  Filled 2014-10-27 (×2): qty 1

## 2014-10-27 MED ORDER — LORAZEPAM 1 MG PO TABS
1.0000 mg | ORAL_TABLET | ORAL | Status: AC
  Administered 2014-10-27: 1 mg via ORAL

## 2014-10-27 MED ORDER — NIFEDIPINE ER 30 MG PO TB24
30.0000 mg | ORAL_TABLET | Freq: Once | ORAL | Status: AC
  Administered 2014-10-27: 30 mg via ORAL
  Filled 2014-10-27: qty 1

## 2014-10-27 MED ORDER — BUDESONIDE 0.25 MG/2ML IN SUSP
0.2500 mg | Freq: Two times a day (BID) | RESPIRATORY_TRACT | Status: DC
  Administered 2014-10-27 (×2): 0.25 mg via RESPIRATORY_TRACT
  Filled 2014-10-27 (×3): qty 2

## 2014-10-27 MED ORDER — FUROSEMIDE 10 MG/ML IJ SOLN
40.0000 mg | Freq: Every day | INTRAMUSCULAR | Status: DC
  Administered 2014-10-27: 40 mg via INTRAVENOUS
  Filled 2014-10-27 (×2): qty 4

## 2014-10-27 MED ORDER — FUROSEMIDE 40 MG PO TABS
40.0000 mg | ORAL_TABLET | Freq: Every day | ORAL | Status: DC
  Filled 2014-10-27 (×2): qty 1

## 2014-10-27 MED ORDER — BUDESONIDE 0.25 MG/2ML IN SUSP
0.2500 mg | Freq: Four times a day (QID) | RESPIRATORY_TRACT | Status: DC | PRN
  Filled 2014-10-27: qty 2

## 2014-10-27 MED ORDER — NIFEDIPINE ER 60 MG PO TB24
60.0000 mg | ORAL_TABLET | Freq: Every day | ORAL | Status: DC
Start: 2014-10-28 — End: 2014-10-28
  Filled 2014-10-27: qty 1

## 2014-10-27 MED ORDER — CARVEDILOL 3.125 MG PO TABS
3.1250 mg | ORAL_TABLET | Freq: Two times a day (BID) | ORAL | Status: DC
  Administered 2014-10-27: 3.125 mg via ORAL
  Filled 2014-10-27 (×2): qty 1

## 2014-10-27 MED ORDER — IOHEXOL 350 MG/ML SOLN
100.0000 mL | Freq: Once | INTRAVENOUS | Status: AC | PRN
  Administered 2014-10-27: 100 mL via INTRAVENOUS

## 2014-10-27 MED ORDER — IPRATROPIUM-ALBUTEROL 0.5-2.5 (3) MG/3ML IN SOLN
3.0000 mL | Freq: Four times a day (QID) | RESPIRATORY_TRACT | Status: DC
  Administered 2014-10-27 – 2014-10-30 (×8): 3 mL via RESPIRATORY_TRACT
  Filled 2014-10-27 (×15): qty 3

## 2014-10-27 MED ORDER — CARVEDILOL 6.25 MG PO TABS
6.2500 mg | ORAL_TABLET | Freq: Two times a day (BID) | ORAL | Status: DC
  Filled 2014-10-27 (×3): qty 1

## 2014-10-27 MED ORDER — NIFEDIPINE ER 30 MG PO TB24
30.0000 mg | ORAL_TABLET | ORAL | Status: AC
  Administered 2014-10-27: 30 mg via ORAL
  Filled 2014-10-27: qty 1

## 2014-10-27 NOTE — Progress Notes (Signed)
I received a referral from [place] who first met pt on 3/24.  I attempted follow-up on 3/25, but pt was sleeping at the time.  Today, pt was rocking in her bed and stated that she was experiencing pain in her chest.  She admitted that the pain was making her anxious and that she had not slept on account of anxiety from her difficulty breathing.    I offered to do some relaxation techniques with her to help with some of the anxiety, but she declined at this time.  I will follow up as I am able, but please also page as needs arise.  [place] Pager, [phone] 12:24 PM    10/27/14 1200  Clinical Encounter Type  Visited With Patient  Visit Type Spiritual support  Referral From Nurse;Chaplain  Spiritual Encounters  Spiritual Needs Emotional

## 2014-10-27 NOTE — Progress Notes (Signed)
At 0023 patient had another episode of SOB and chest tightness.Bp 162/124 P-121.R-26.DTR's WNL and lungs are a little diminished in the bases Labetolol 40 mg IV push given as ordered along with some Ambien for rest.Bp came down to 144/94 and pulse down to 102,Remains SOB with O2 at 2 liters.Patient states feels better and wanted to sleep.Patient had a second episode at 0400 but her BP was 143/111,P-103,R-22 SOB,and chest tightness.Labetalol 20 mg IV push given and her IV site burned to bad for me to complete dose and had to be removed but her bp came down to 127/108  At 0414 and she was also given Percocet for pain and wanted to sleep.

## 2014-10-27 NOTE — Progress Notes (Signed)
CSW met with [name] in her third floor room to check in and offer support as she deals with her own medical issues.  CSW asked how her spirits are and [name] replied, "pretty weak" and got tears in her eyes.  [name] does not feel like talking at this time, but thanked CSW for the support offered.  CSW asked if [name] would like a picture of Jaxson.  [name] agreed.  CSW took picture to [name], however, Cardiology was with her at this time.  CSW gave picture to bedside RN to request that picture be given to [name].

## 2014-10-27 NOTE — Progress Notes (Signed)
  Echocardiogram 2D Echocardiogram has been performed.  [name], [name] M 10/27/2014, 1:19 PM

## 2014-10-27 NOTE — Progress Notes (Addendum)
CARDIOLOGY CONSULT NOTE   Patient ID: Lauren Sharp MRN: [id number] DOB/AGE: 1991-04-23 24 y.o.  Admit date: 10/19/2014  Primary Physician   No primary care provider on file. Primary Cardiologist   New Reason for Consultation   CHF, low EF  Lauren Sharp:WJXBJ Spainhower is a 24 y.o. year old female G1P0 who was admitted 03/21 for PIH, pre-eclampsia. She was at [redacted] weeks gestation.   She was treated medically but developed pulmonary edema and a CCM consult was called. She was treated medically, and had a C-section on 10/21/2014.   She tolerated the procedure well, but continued to have problems with HTN and SOB, requiring diuresis. At first, this was felt related to the PIH/pre-eclampsia, and she continued to be treated. However, when her symptoms and volume status did not improve, so a cardiology consult and an echocardiogram were ordered.   The echocardiogram showed an EF of 15% with global hypokinesis, LV & RV failure, pulmonary hypertension, results below.  Currently, Ms Lauren Sharp still feels SOB, worse with minimal exertion. This is improved by nasal oxygen. She had some LE edema that has improved. She has not had chest pain.    Past Medical History  Diagnosis Date  . Gonorrhea   . Bacterial vaginosis   . Psoriasis   . Vaginal Pap smear, abnormal   . Hypertension      Past Surgical History  Procedure Laterality Date  . Tooth extraction    . No past surgeries    . Cesarean section N/A 10/21/2014    Procedure: CESAREAN SECTION;  Surgeon: Tracey Harries, MD;  Location: [facility];  Service: Obstetrics;  Laterality: N/A;    No Known Allergies  I have reviewed the patient's current medications . budesonide  0.25 mg Nebulization BID  . furosemide  10 mg Oral Daily  . ipratropium-albuterol  3 mL Nebulization Q6H  . labetalol  300 mg Oral TID  . LORazepam  1 mg Oral TID  . [START ON 10/28/2014] NIFEdipine  60 mg Oral Daily  . potassium chloride  20 mEq Oral TID  . prenatal multivitamin   1 tablet Oral Q1200  . senna-docusate  2 tablet Oral Q24H   . naLOXone (NARCAN) adult infusion for PRURITIS     acetaminophen, witch hazel-glycerin **AND** dibucaine, labetalol, lanolin, LORazepam, menthol-cetylpyridinium, nalbuphine **OR** nalbuphine, naLOXone (NARCAN) adult infusion for PRURITIS, naloxone **AND** sodium chloride, ondansetron (ZOFRAN) IV, oxyCODONE-acetaminophen, oxyCODONE-acetaminophen, simethicone, zolpidem  Prior to Admission medications   Medication Sig Start Date End Date Taking? Authorizing Provider  calcium carbonate (TUMS - DOSED IN MG ELEMENTAL CALCIUM) 500 MG chewable tablet Chew 1 tablet by mouth as needed for heartburn.    Yes Historical Provider, MD  labetalol (NORMODYNE) 200 MG tablet Take 1 tablet (200 mg total) by mouth 2 (two) times daily. 10/18/14  Yes Huel Cote, MD  potassium chloride (K-DUR,KLOR-CON) 10 MEQ tablet Take 1 tablet (10 mEq total) by mouth 3 (three) times daily. 09/26/14  Yes Lavina Hamman, MD  Prenatal Vit-Fe Fumarate-FA (PRENATAL MULTIVITAMIN) TABS tablet Take 1 tablet by mouth daily at 12 noon.   Yes Historical Provider, MD     History   Social History  . Marital Status: Single    Spouse Name: N/A  . Number of Children: N/A  . Years of Education: N/A   Occupational History  . Not on file.   Social History Main Topics  . Smoking status: Current Some Day Smoker -- 1.00 packs/day for 10 years    Types:  Cigarettes  . Smokeless tobacco: Never Used  . Alcohol Use: Yes     Comment: Rare  . Drug Use: No  . Sexual Activity: Yes    Birth Control/ Protection: None     Comment: last sex three days ago   Other Topics Concern  . Not on file   Social History Narrative    Family Status  Relation Status Death Age  . Mother Alive   . Father Deceased   . Sister Alive    Family History  Problem Relation Age of Onset  . Arthritis Mother   . Depression Mother   . Hyperlipidemia Mother   . Learning disabilities Mother   .  Mental illness Mother   . Varicose Veins Mother   . Alcohol abuse Father   . Arthritis Father   . Asthma Father   . Cancer Father   . Drug abuse Father   . Diabetes Father   . Early death Father   . Heart disease Father   . Learning disabilities Sister   . Hyperlipidemia Maternal Grandmother   . Hyperlipidemia Maternal Grandfather      ROS:  Full 14 point review of systems complete and found to be negative unless listed above. Patient denies fever, chills, headache, sweats, rash, change in vision, change in hearing, chest pain, nausea vomiting, urinary symptoms. All other systems are reviewed and are negative.  Physical Exam: Blood pressure 132/97, pulse 103, temperature 97.8 F (36.6 C), temperature source Oral, resp. rate 18, height  (1.626 m), weight 215 lb 6.4 oz (97.705 kg), last menstrual period 03/04/2014, SpO2 96 %, not currently breastfeeding.  General: Well developed, well nourished, female in no acute distress Head: Eyes PERRLA, No xanthomas.   Normocephalic and atraumatic, oropharynx without edema or exudate. Dentition: good Lungs: rales bases Heart: HRRR S1 S2, no rub/gallop, no murmur. pulses are 2+ all 4 extrem.   Neck: No carotid bruits. No lymphadenopathy.  JVD elevated, + hepatojugular reflux. Abdomen: Bowel sounds present, abdomen soft and tender due to incision, without masses or hernias noted. Msk:  No spine or cva tenderness. No weakness, no joint deformities or effusions. Extremities: No clubbing or cyanosis. trace edema.  Neuro: Alert and oriented X 3. No focal deficits noted. Psych:  Good affect, responds appropriately Skin: No rashes or lesions noted. Incision healing well.  Labs:   Lab Results  Component Value Date   WBC 10.2 10/24/2014   HGB 10.7* 10/24/2014   HCT 31.3* 10/24/2014   MCV 86.0 10/24/2014   PLT 159 10/24/2014    Recent Labs Lab 10/26/14 1119  NA 138  K 4.1  CL 108  CO2 21  BUN 22  CREATININE 0.60  CALCIUM 9.5  PROT  5.9*  BILITOT 0.2*  ALKPHOS 86  ALT 15  AST 21  GLUCOSE 124*  ALBUMIN 2.9*   MAGNESIUM  Date Value Ref Range Status  10/27/2014 1.7 1.5 - 2.5 mg/dL Final   B NATRIURETIC PEPTIDE  Date/Time Value Ref Range Status  10/27/2014 11:35 AM 2719.1* 0.0 - 100.0 pg/mL Final    Comment:    Performed at [facility]   Lab Results  Component Value Date   DDIMER 3.14* 10/21/2014   Echo: 10/27/2014 Study Conclusions - Left ventricle: The cavity size was mildly dilated. Wall thickness was normal. Systolic function was severely reduced. The estimated ejection fraction was 15%. Diffuse hypokinesis. Doppler parameters are consistent with restrictive physiology, indicative of decreased left ventricular diastolic compliance and/or increased left  atrial pressure. - Aortic valve: There was trivial regurgitation. - Mitral valve: There was mild regurgitation. - Left atrium: The atrium was moderately dilated. - Right ventricle: Systolic function was moderately reduced. - Pulmonary arteries: Systolic pressure was mildly increased. PA peak pressure: 41 mm Hg (S). - Pericardium, extracardiac: A small pericardial effusion was identified. There was a left pleural effusion. Impressions: - Severe global reduction in LV function; restrictive filling pattern; moderate LAE; moderate RV dysfunction; mild MR; trace AI; mildly elevated pulmonary pressure; small pericardial effusion.  ECG: SR, no acute ischemic changes.  EKG reveals sinus tachycardia.  Radiology:  Dg Chest 2 View 10/26/2014   CLINICAL DATA:  Dyspnea, congestive heart failure  EXAM: CHEST  2 VIEW  COMPARISON:  10/23/2014  FINDINGS: Cardiomediastinal silhouette is stable. Residual mild interstitial prominence/interstitial edema with slight improvement in aeration. Persistent streaky left basilar atelectasis or infiltrate.  IMPRESSION: Mild improved aeration. Residual mild interstitial edema. Persistent streaky left  basilar atelectasis or infiltrate.   Electronically Signed   By: Natasha Mead M.D.   On: 10/26/2014 11:19   Ct Angio Chest Pe W/cm &/or Wo Cm 10/27/2014   CLINICAL DATA:  Shortness of breath and discomfort in the chest after Cesarean section delivery October 21, 2014.  EXAM: CT ANGIOGRAPHY CHEST WITH CONTRAST  TECHNIQUE: Multidetector CT imaging of the chest was performed using the standard protocol during bolus administration of intravenous contrast. Multiplanar CT image reconstructions and MIPs were obtained to evaluate the vascular anatomy.  CONTRAST:  OMNIPAQUE IOHEXOL 350 MG/ML SOLN  COMPARISON:  None.  FINDINGS: THORACIC INLET/BODY WALL:  No acute abnormality.  MEDIASTINUM:  Borderline cardiomegaly. No pericardial effusion. No aortic aneurysm or dissection. No pulmonary embolism.  Mild mediastinal and hilar (asymmetric to the right) lymphadenopathy, with the right peritracheal node measuring up to 14 mm. No remote chest imaging to determine stability.  LUNG WINDOWS:  There is diffuse interlobular septal thickening, bronchial cuffing, and symmetric airspace opacity. Findings consistent with pulmonary edema. Moderate, layering pleural effusions.  There is a rounded 6 mm nodule in the right lower lobe. Given patient's young age, negative malignancy history, and isolated finding this is considered incidental.  UPPER ABDOMEN:  No acute findings.  OSSEOUS:  Negative.  Review of the MIP images confirms the above findings.  IMPRESSION: 1. Negative for pulmonary embolism. 2. Pulmonary edema and moderate layering pleural effusions. 3. Mild hilar and mediastinal lymphadenopathy. Suggest outpatient workup for sarcoidosis and three-month follow-up chest x-ray or CT if needed.   Electronically Signed   By: Marnee Spring M.D.   On: 10/27/2014 10:07    ASSESSMENT AND PLAN:   The patient was seen today by Dr. Myrtis Ser, the patient evaluated and the data reviewed.   Active Problems:   Preeclampsia complicating  hypertension - 03/18 notes mentions probable borderline HTN prior to pregnancy, but pt never on meds for this.    H/O cesarean section 03/22.  Done at 33 weeks because of severe preeclampsia - per Dr. Ambrose Mantle - pt says will not breast-feed    Acute respiratory failure with hypoxia - per CCM, OK to change the nebulizers to q 6 hr PRN    Respiratory failure - needs additional diuresis, see below - otherwise per CCM, Dr Ambrose Mantle    Abnormal chest CT - The CTA chest showed pulmonary edema and layering effusions, results below. There was some lymphadenopathy that is non-diagnostic, but needs to be followed with another CT or CXR in 3 months.    Acute  systolic CHF - new dx, reason for LVD is unclear - regardless, will diurese w/ IV Lasix - add low-dose ACE/BB since BP still high  [facility]. [facility], but pt and mother are OK with transfer.   SignedTheodore Demark: Rhonda Barrett, PA-C 10/27/2014 3:26 PM Beeper [phone] Patient seen and examined. I agree with the assessment and plan as detailed above. See also my additional thoughts below.   The patient had significant preeclampsia. The baby was delivered by C-section early at 33 weeks. The [facility]. After delivery diuresis was enabled with the use of small doses of diuretics. The patient had a significant diuresis. However she had continued problems with episodes of shortness of breath. Along with this she had hypertensive episodes. She was seen by pulmonary today. They wanted to be sure that pulmonary emboli and cardiomyopathy were ruled out. Chest CT showed no pulmonary emboli. The chest CT did show pulmonary edema and effusions. There was also one small incidental lung nodule that was felt insignificant. There was also some hilar adenopathy with a recommendation that this be followed up. Two-dimensional echo was done. I have personally reviewed this study.  There is severe global left ventricular dysfunction with an ejection fraction in the range of 20%. In addition there is severe right ventricular dysfunction. There is a very small posterior pericardial effusion. There is a large pleural effusion.  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 It is quite severe. She continues to have episodes of shortness of breath and she has rales on physical exam. I reviewed her situation immediately with Dr. Gala RomneyBensimhon. I was concerned about her overall status. He agreed and the decision has been made for the patient to be brought [facility] to be managed directly by the heart failure team. The patient is not breast-feeding. The baby [facility].  As part of this evaluation I spent greater than 90 minutes with her overall care. I had extensive discussions with the patient and her mother. Her mother is very thoughtful and carrying and seemed to understand what I was describing. I also strongly encourage the patient to not become pregnant again. I said this in front of the patient and the mother. I explained that there was significant risk to her life if she were to become pregnant again. Her mother asked if the patient should have her tubes tied. Of course I stated that that was a decision to be made by the patient and all of her doctors on an ongoing basis.  The patient was to receive 40 mg of IV Lasix. We were starting 3.125 mg of carvedilol twice a day along with 2.5 mg of Altace daily. The patient will be aggressively managed by [facility].  Active Problems:   Preeclampsia complicating hypertension   H/O cesarean section   Acute respiratory failure with hypoxia   Acute systolic CHF (congestive heart failure)   Peripartum cardiomyopathy, postpartum   Hilar adenopathy  Willa RoughJeffrey Redell Bhandari, MD, [facility] 10/27/2014 4:57 PM

## 2014-10-27 NOTE — Progress Notes (Signed)
PULMONARY / CRITICAL CARE MEDICINE   Name: Lauren Sharp MRN: [id number] DOB: 05-30-1991    ADMISSION DATE:  10/19/2014 CONSULTATION DATE:  3/21  REFERRING MD :  Meisinger  REASON: acute respiratory distress  PT PROFILE:  53 F admitted 3/20 with pre-eclampsia manifesting as hypertension, HA and proteinuria. She was admitted for magnesium infusion. On the evening of 3/21 she developed severe respiratory distress, pulm edema pattern on CXR and resp acidosis. Her symptoms improved some with diuresis but she remained markedly SOB. PCCM asked to evaluate    SIGNIFICANT EVENTS: 3/21 in respiratory failure due to acute pulmonary edema 3/22 Improved with diureses of 5 liters.   SUBJECTIVE/OVERNIGHT/INTERVAL HX 10/27/2014 : recalled pulmonary - due to persistent hypoxemia needing 2L . Desaturates otherwise. Off Mag. Last [facility] by [facility] 10/21/14. Review of chars indicate : persistent dyspnea walking to restroom with dry cough and reports of cxr c/w waxin/waning pulmonary edema. High BP continues to be a problem Cards consult called - echo ordered. Patient on low dose lasix    - Of note, no fever    - Anxiety driving issues as well   VITAL SIGNS: Temp:  [97.5 F (36.4 C)-98.7 F (37.1 C)] 98.7 F (37.1 C) (03/28 0200) Pulse Rate:  [97-121] 98 (03/28 0838) Resp:  [20-26] 26 (03/28 0722) BP: (127-162)/(89-125) 136/93 mmHg (03/28 0838) SpO2:  [95 %-100 %] 98 % (03/28 0722)  HEMODYNAMICS:   VENTILATOR SETTINGS:   INTAKE / OUTPUT:  Intake/Output Summary (Last 24 hours) at 10/27/14 0916 Last data filed at 10/27/14 0444  Gross per 24 hour  Intake    920 ml  Output   1850 ml  Net   -930 ml   PHYSICAL EXAMINATION: General: Alert, oriented and following commands. Neuro: no focal deficits. Sitting in wheel chair. Denies complaints HEENT: NCAT, EOMI Cardiovascular: Tachy, regular, no M noted Lungs: Decrease BS bibasilarly but otherwise clear Abdomen:  obese, nontender, diminished  BS Ext: trace symmetric pretibial edema  LABS:  PULMONARY  Recent Labs Lab 10/21/14 0008 10/21/14 0243 10/23/14 2035  PHART 7.248* 7.455* 7.491*  PCO2ART 57.7* 31.5* 30.3*  PO2ART 89.9 487.0* 70.8*  HCO3 24.3* 21.8 22.9  TCO2 26.1 22.8 23.8  O2SAT 93.0 100.0 97.0    CBC  Recent Labs Lab 10/22/14 0527 10/23/14 0835 10/24/14 1000  HGB 9.4* 9.1* 10.7*  HCT 27.3* 26.5* 31.3*  WBC 14.2* 8.5 10.2  PLT 161 136* 159    COAGULATION  Recent Labs Lab 10/21/14 0201  INR 1.09    CARDIAC  No results for input(s): TROPONINI in the last 168 hours. No results for input(s): PROBNP in the last 168 hours.   CHEMISTRY  Recent Labs Lab 10/21/14 0020 10/21/14 0548 10/22/14 0527 10/23/14 0835 10/24/14 1000 10/26/14 1119  NA 137 137 135 139 139 138  K 3.8 3.3* 3.1* 3.3* 3.0* 4.1  CL 106 107 104 106 104 108  CO2 GLUCOSE 226* 205* 95 78 98 124*  BUN CREATININE 0.66 0.64 0.70 0.60 0.51 0.60  CALCIUM 8.4 8.2* 8.9 9.2 9.6 9.5  MG 4.9*  --   --   --   --   --    Estimated Creatinine Clearance: 124.1 mL/min (by C-G formula based on Cr of 0.6).   LIVER  Recent Labs Lab 10/21/14 0201 10/21/14 0548 10/22/14 0527 10/23/14 0835 10/24/14 1000 10/26/14 1119  AST  --  21  ALT  --  20 14 14 17 15   ALKPHOS  --  93 64 70 87 86  BILITOT  --  0.9 0.3 0.2* 0.2* 0.2*  PROT  --  5.4* 4.8* 4.9* 5.5* 5.9*  ALBUMIN  --  2.5* 2.3* 2.4* 2.7* 2.9*  INR 1.09  --   --   --   --   --      INFECTIOUS  Recent Labs Lab 10/21/14 0102 10/21/14 0348  LATICACIDVEN 2.1* 1.9     ENDOCRINE CBG (last 3)  No results for input(s): GLUCAP in the last 72 hours.       IMAGING x48h Dg Chest 2 View  10/26/2014   CLINICAL DATA:  Dyspnea, congestive heart failure  EXAM: CHEST  2 VIEW  COMPARISON:  10/23/2014  FINDINGS: Cardiomediastinal silhouette is stable. Residual mild interstitial prominence/interstitial edema with slight  improvement in aeration. Persistent streaky left basilar atelectasis or infiltrate.  IMPRESSION: Mild improved aeration. Residual mild interstitial edema. Persistent streaky left basilar atelectasis or infiltrate.   Electronically Signed   By: Natasha MeadLiviu  Pop M.D.   On: 10/26/2014 11:19        ASSESSMENT / PLAN:  PULMONARY A: Acute respiratory failure with Pulmonary edema Wheezing - likely "cardiac asthma" Smoker   - Not wheezing. Still hypoxemic. Given CXR fidings and high BP and her obese status - concern is post partum cardiomyopathy or acute diastolic dysfunction/chf. Less likely PE but possible  P:   Get CTA chest Await ECHO and Check BNP; depending on results might need more aggressive lasix esp IV Check mag, phos Supp O2 Restart Scheduled nebulized steroids and BDs Check PCT and lactae - ensure no infections     Dr. Kalman ShanMurali Voshon Petro, M.D., [facility].C.C.P Pulmonary and Critical Care Medicine Staff Physician [facility] Pager: [phone], If no answer or between  15:00h - 7:00h: call [phone]  10/27/2014 9:19 AM

## 2014-10-27 NOTE — Progress Notes (Signed)
[facility] awake and alert. Wipe with chg and nasal swab done. Denied any discomfort.

## 2014-10-27 NOTE — Discharge Summary (Signed)
NAMEASPIN, PALOMAREZ                ACCOUNT NO.:  [id number]  MEDICAL RECORD NO.:  [id number]  LOCATION:  9320                          FACILITY:  [facility]  PHYSICIAN:  Malachi Pro. Ambrose Mantle, M.D. DATE OF BIRTH:  01-Mar-1991  DATE OF ADMISSION:  10/19/2014 DATE OF DISCHARGE:                              DISCHARGE SUMMARY   HISTORY OF PRESENT ILLNESS:  This is a 24 year old white female, para 0- 1-0-1, who is now 6 days status post C-section with presumed peripartum cardiomyopathy.  She is being transferred to [facility] on the cardiology unit.  This patient had developing hypertension at about 29 weeks' gestation.  She was admitted then with labs and 24-hour urine protein, which were normal and she received steroids to help mature the baby's lungs.  She had been followed closely as an outpatient since that time with labs and nonstress test.  She was stable on labetalol 100 mg twice daily until recently before admission on March 18, which she presented with blood pressure, markedly elevated to 180/120.  She was sent to MAU and blood pressure improved at rest to 150-160/100-105.  PAST MEDICAL HISTORY:  Significant for history of gonorrhea, BV, psoriasis, and hypertension.  PAST SURGICAL HISTORY:  Negative except for tooth extraction.  FAMILY HISTORY:  Her father abused alcohol.  Father and mother had arthritis.  Father had asthma.  Father had cancer.  Mother had depression.  Mother had diabetes.  Father used drugs, died at an early age.  Mother had heart disease.  SOCIAL HISTORY:  The patient reported that she did smoke cigarettes. She had been smoking for 10 pack years.  She had never used smokeless tobacco.  She drank alcohol, did not use drugs.  HOSPITAL COURSE:  She was admitted at that time and the labetalol was increased to 200 mg twice a day and was discharged on October 18, 2014. She was re-admitted on October 20, 2014 at 33 weeks and 1-day gestation. Her 24-hour urine showed 454 mg  of protein in 24 hours.  She developed a headache on October 19, 2014 late in the evening.  Returned to the hospital.  Blood pressure was markedly elevated again and she was admitted.  After admission, she received IV labetalol and her blood pressure did not go below 170/105.  She was begun on magnesium sulfate. Blood pressures were 150/90 to 180/115 and the plan was to deliver her at 34 weeks if she could hang on that long.  Later in the evening of October 20, 2014, she began to have shortness of breath.  She was seen by Dr. Jackelyn Knife in respiratory distress.  He called the anesthesiologist for assistance in managing her respiratory status.  Chest x-ray and arterial blood gases were compatible with pulmonary edema.  Blood pressure was 170-180/110-120.  The patient was seen by Dr. Darrol Angel from Critical Care.  On October 21, 2014, I examined the patient and consulted with Maternal Fetal Medicine and Dr. Sherrie George recommended a C-section.  We did not want to resume magnesium sulfate because she had gone into pulmonary edema on magnesium sulfate.  She underwent a C-section on October 21, 2014, delivered an infant from the breech presentation. Postoperatively,  she seemed to initially do well.  Blood pressures were stable.  On October 23, 2014, she was tolerating a diet and passing some flatus and late on the evening of October 23, 2014, she began to feel bad with a heavy chest and shortness of breath returning from the NICU. Blood pressure was 187/130.  She was given labetalol 300 mg by mouth and Lasix 20 mg IV.  Then, she was given hydralazine to lower her blood pressure.  Later on October 23, 2014, she felt somewhat better, but her blood pressure was still 169/113.  Over the next 3 days, her shortness of breath probably improved a little bit.  She continued to have very high blood pressure and her labetalol was increased to 300 mg three times a day with periodic injections of 40 mg labetalol IV.  Today,  she continued to have poorly controlled high blood pressure and shortness of breath.  She was short of breath on walking to the rest room.  She had a dry cough with little sputum production.  Chest x-rays were consistent with waxing and waning, pulmonary edema.  Heart sounds were normal.  The lungs were clear.  CT of the chest showed no pulmonary embolus.  An echocardiogram is reported to have shown an ejection fraction of 50% and she is [facility].  FINAL DIAGNOSIS: 1. Intrauterine pregnancy at 33 weeks, delivered breech presentation     by C-section. 2. Recurrent pulmonary edema. 3. Preeclampsia. 4. Presumed peripartum cardiomyopathy.  The patient will be [facility]. She is to call me for return to my office in 1 week to have her staples removed.  If she has not been discharged from the hospital, I will do it [facility] in her [facility].     Malachi Prohomas F. Ambrose MantleHenley, M.D.     TFH/MEDQ  D:  10/27/2014  T:  10/27/2014  Job:  [id number]

## 2014-10-27 NOTE — Progress Notes (Signed)
Patient ID: Lauren LynnRikki Derosia, female   DOB: 08/31/1990, 24 y.o.   MRN: [id number] Brien FewRikki continues with poorly controlled HBP and shortness of breath. She states she is SOB on walking to the restroom. She has a dry cough with little sputum production. She denies any headache.  Her CXR's are consistent with waxing and waning pulmonary edema.Her pulse is 108 and her heart sounds are normal. The lungs are clear.  I want her to have a CT scan of the chest but her only IV site is her foot so I have asked anesthesiologist Dr. Gentry RochJudd to assist in a more prominent IV site. I have consulted pulmonology. She definitely needs an echocardiogram and cardiology has ordered that. She will be seen by cardiology and pulmonology to elucidate the source of her recurrent pulmonary edema.

## 2014-10-27 NOTE — Lactation Note (Signed)
This note was copied from the chart of [name]. Lactation Consultation Note   Brief consult with this mom - she is no longer proving EBM for her baby.  Patient Name: Lauren Sharp Reason for consult: Follow-up assessment   Maternal Data    Feeding Feeding Type: Formula Length of feed: 60 min  LATCH Score/Interventions                      Lactation Tools Discussed/Used WIC Program: Yes   Consult Status Consult Status: Complete Follow-up type: Call as needed    Alfred LevinsLee, Cheikh Bramble Anne Sharp, 11:36 AM

## 2014-10-27 NOTE — Progress Notes (Signed)
Patient ID: Lauren Sharp, female   DOB: 06/10/1991, 24 y.o.   MRN: [id number] The pt has been seen by cardiology and pulmonology and recommendation is to [facility]. She has had CT scan of the chest which showed no PE and echocardiogram showed EF of 15%. l give her a discharge instruction booklet.

## 2014-10-28 DIAGNOSIS — R0603 Acute respiratory distress: Secondary | ICD-10-CM | POA: Insufficient documentation

## 2014-10-28 DIAGNOSIS — R0600 Dyspnea, unspecified: Secondary | ICD-10-CM | POA: Insufficient documentation

## 2014-10-28 DIAGNOSIS — I5021 Acute systolic (congestive) heart failure: Principal | ICD-10-CM

## 2014-10-28 LAB — URINALYSIS, ROUTINE W REFLEX MICROSCOPIC
Bilirubin Urine: NEGATIVE
GLUCOSE, UA: NEGATIVE mg/dL
Ketones, ur: NEGATIVE mg/dL
Nitrite: NEGATIVE
Protein, ur: 30 mg/dL — AB
SPECIFIC GRAVITY, URINE: 1.013 (ref 1.005–1.030)
Urobilinogen, UA: 1 mg/dL (ref 0.0–1.0)
pH: 6.5 (ref 5.0–8.0)

## 2014-10-28 LAB — URINE MICROSCOPIC-ADD ON

## 2014-10-28 LAB — CBC
HEMATOCRIT: 31.8 % — AB (ref 36.0–46.0)
Hemoglobin: 10.8 g/dL — ABNORMAL LOW (ref 12.0–15.0)
MCH: 29.2 pg (ref 26.0–34.0)
MCHC: 34 g/dL (ref 30.0–36.0)
MCV: 85.9 fL (ref 78.0–100.0)
PLATELETS: 217 10*3/uL (ref 150–400)
RBC: 3.7 MIL/uL — AB (ref 3.87–5.11)
RDW: 15.4 % (ref 11.5–15.5)
WBC: 16 10*3/uL — AB (ref 4.0–10.5)

## 2014-10-28 LAB — CARBOXYHEMOGLOBIN
Carboxyhemoglobin: 1.7 % — ABNORMAL HIGH (ref 0.5–1.5)
Methemoglobin: 1 % (ref 0.0–1.5)
O2 Saturation: 71.7 %
TOTAL HEMOGLOBIN: 11 g/dL — AB (ref 12.0–16.0)

## 2014-10-28 LAB — HEPATITIS PANEL, ACUTE
HCV AB: NEGATIVE
HEP A IGM: NONREACTIVE
HEP B S AG: NEGATIVE
Hep B C IgM: NONREACTIVE

## 2014-10-28 LAB — BASIC METABOLIC PANEL
Anion gap: 8 (ref 5–15)
BUN: 16 mg/dL (ref 6–23)
CHLORIDE: 106 mmol/L (ref 96–112)
CO2: 23 mmol/L (ref 19–32)
Calcium: 10.2 mg/dL (ref 8.4–10.5)
Creatinine, Ser: 0.76 mg/dL (ref 0.50–1.10)
GFR calc Af Amer: >90 mL/min (ref >90)
Glucose, Bld: 108 mg/dL — ABNORMAL HIGH (ref 70–99)
POTASSIUM: 4.3 mmol/L (ref 3.5–5.1)
SODIUM: 137 mmol/L (ref 135–145)

## 2014-10-28 MED ORDER — SODIUM CHLORIDE 0.9 % IJ SOLN: 3.0000 mL | INTRAMUSCULAR | Status: DC | PRN

## 2014-10-28 MED ORDER — MAGNESIUM SULFATE 50 % IJ SOLN
3.0000 g | Freq: Once | INTRAMUSCULAR | Status: DC
  Filled 2014-10-28: qty 6

## 2014-10-28 MED ORDER — SODIUM CHLORIDE 0.9 % IV SOLN: 250.0000 mL | INTRAVENOUS | Status: DC | PRN

## 2014-10-28 MED ORDER — TRAMADOL HCL 50 MG PO TABS
50.0000 mg | ORAL_TABLET | Freq: Four times a day (QID) | ORAL | Status: DC | PRN
  Administered 2014-10-28 – 2014-10-30 (×5): 50 mg via ORAL
  Filled 2014-10-28 (×5): qty 1

## 2014-10-28 MED ORDER — POTASSIUM CHLORIDE CRYS ER 20 MEQ PO TBCR
20.0000 meq | EXTENDED_RELEASE_TABLET | Freq: Every day | ORAL | Status: DC
  Administered 2014-10-28: 20 meq via ORAL
  Filled 2014-10-28: qty 1

## 2014-10-28 MED ORDER — ENOXAPARIN SODIUM 40 MG/0.4ML ~~LOC~~ SOLN
40.0000 mg | SUBCUTANEOUS | Status: DC
  Filled 2014-10-28: qty 0.4

## 2014-10-28 MED ORDER — LISINOPRIL 5 MG PO TABS
5.0000 mg | ORAL_TABLET | Freq: Every day | ORAL | Status: DC
  Filled 2014-10-28: qty 1

## 2014-10-28 MED ORDER — FUROSEMIDE 40 MG PO TABS
40.0000 mg | ORAL_TABLET | Freq: Every day | ORAL | Status: DC
  Administered 2014-10-28 – 2014-10-29 (×2): 40 mg via ORAL
  Filled 2014-10-28 (×2): qty 1

## 2014-10-28 MED ORDER — SODIUM CHLORIDE 0.9 % IJ SOLN: 10.0000 mL | INTRAMUSCULAR | Status: DC | PRN

## 2014-10-28 MED ORDER — LORAZEPAM 2 MG/ML IJ SOLN: 0.5000 mg | Freq: Four times a day (QID) | INTRAMUSCULAR | Status: DC

## 2014-10-28 MED ORDER — DIGOXIN 125 MCG PO TABS
0.1250 mg | ORAL_TABLET | Freq: Every day | ORAL | Status: DC
  Filled 2014-10-28: qty 1

## 2014-10-28 MED ORDER — CITALOPRAM HYDROBROMIDE 10 MG PO TABS
10.0000 mg | ORAL_TABLET | Freq: Every day | ORAL | Status: DC
  Administered 2014-10-28 – 2014-10-30 (×3): 10 mg via ORAL
  Filled 2014-10-28 (×4): qty 1

## 2014-10-28 MED ORDER — ACETAMINOPHEN 325 MG PO TABS: 650.0000 mg | ORAL_TABLET | ORAL | Status: DC | PRN

## 2014-10-28 MED ORDER — SENNOSIDES-DOCUSATE SODIUM 8.6-50 MG PO TABS: 2.0000 | ORAL_TABLET | Freq: Every day | ORAL | Status: DC | PRN

## 2014-10-28 MED ORDER — ONDANSETRON HCL 4 MG/2ML IJ SOLN: 4.0000 mg | Freq: Four times a day (QID) | INTRAMUSCULAR | Status: DC | PRN

## 2014-10-28 MED ORDER — SODIUM CHLORIDE 0.9 % IJ SOLN
10.0000 mL | Freq: Two times a day (BID) | INTRAMUSCULAR | Status: DC
  Administered 2014-10-28 – 2014-10-31 (×7): 10 mL

## 2014-10-28 MED ORDER — LORAZEPAM 2 MG/ML IJ SOLN
0.5000 mg | Freq: Four times a day (QID) | INTRAMUSCULAR | Status: DC | PRN
  Administered 2014-10-28 – 2014-10-31 (×6): 0.5 mg via INTRAVENOUS
  Filled 2014-10-28 (×6): qty 1

## 2014-10-28 MED ORDER — SPIRONOLACTONE 12.5 MG HALF TABLET
12.5000 mg | ORAL_TABLET | Freq: Every day | ORAL | Status: DC
  Administered 2014-10-28: 12.5 mg via ORAL
  Filled 2014-10-28 (×3): qty 1

## 2014-10-28 MED ORDER — MAGNESIUM OXIDE 400 (241.3 MG) MG PO TABS
400.0000 mg | ORAL_TABLET | Freq: Two times a day (BID) | ORAL | Status: DC
  Administered 2014-10-28 – 2014-10-31 (×6): 400 mg via ORAL
  Filled 2014-10-28 (×7): qty 1

## 2014-10-28 MED ORDER — CARVEDILOL 3.125 MG PO TABS
3.1250 mg | ORAL_TABLET | Freq: Two times a day (BID) | ORAL | Status: DC
  Administered 2014-10-28 – 2014-10-30 (×4): 3.125 mg via ORAL
  Filled 2014-10-28 (×6): qty 1

## 2014-10-28 MED ORDER — OXYCODONE-ACETAMINOPHEN 5-325 MG PO TABS
2.0000 | ORAL_TABLET | Freq: Once | ORAL | Status: AC
  Administered 2014-10-28: 2 via ORAL
  Filled 2014-10-28: qty 2

## 2014-10-28 MED ORDER — SODIUM CHLORIDE 0.9 % IJ SOLN
3.0000 mL | Freq: Two times a day (BID) | INTRAMUSCULAR | Status: DC
  Administered 2014-10-28 – 2014-10-30 (×2): 3 mL via INTRAVENOUS

## 2014-10-28 MED ORDER — LISINOPRIL 5 MG PO TABS
5.0000 mg | ORAL_TABLET | Freq: Two times a day (BID) | ORAL | Status: DC
  Administered 2014-10-28 – 2014-10-29 (×3): 5 mg via ORAL
  Filled 2014-10-28 (×4): qty 1

## 2014-10-28 MED ORDER — DIGOXIN 250 MCG PO TABS
0.2500 mg | ORAL_TABLET | Freq: Every day | ORAL | Status: DC
  Administered 2014-10-28 – 2014-10-31 (×4): 0.25 mg via ORAL
  Filled 2014-10-28 (×4): qty 1

## 2014-10-28 NOTE — Progress Notes (Signed)
Md notified of pt's c/o right lower back pain with burning during urination.  Fullness feeling.  Obtained urine for culture to r/o UTI.  New orders for pain med obtained. Will continue to monitor. Karena Addisonoro, Angelicia Lessner T

## 2014-10-28 NOTE — Progress Notes (Signed)
Advanced Heart Failure Rounding Note   Subjective:    Lauren Sharp is a 24 y.o. year old female G1P0 who was admitted 03/21 for PIH, pre-eclampsia. She was at [redacted] weeks gestation. She was treated medically but developed pulmonary edema and a CCM consult was called. She had a C-section on 10/21/2014. She notes BP 180/120 for months during her pregnancy.   Echocardiogram showed an EF of 15% with global hypokinesis, LV & RV failure, pulmonary hypertension, results below.  She was transferred from [facility] to [facility] for HF management on 3/28. She was diuresed with IV lasix, carvedilol started as well as altace. Diuresed 2 liters. Weight down 5 pounds. Breathing much better. No longer orthopneic. BP controlled.  Denies SOB. Complaining of lower abdominal discomfort.    Objective:   Weight Range:  Vital Signs:   Temp:  [97.8 F (36.6 C)-98.5 F (36.9 C)] 98.4 F (36.9 C) (03/29 0400) Pulse Rate:  [89-123] 94 (03/29 0608) Resp:  [18-22] 22 (03/28 1945) BP: (102-184)/(48-139) 113/72 mmHg (03/29 0608) SpO2:  [95 %-100 %] 98 % (03/29 0608) FiO2 (%):  [100 %] 100 % (03/28 1612) Last BM Date: 10/23/14  Weight change: Filed Weights   10/22/14 0600 10/23/14 2100 10/24/14 0515  Weight: 235 lb 9.6 oz (106.867 kg) 220 lb (99.791 kg) 215 lb 6.4 oz (97.705 kg)    Intake/Output:   Intake/Output Summary (Last 24 hours) at 10/28/14 0800 Last data filed at 10/28/14 0600  Gross per 24 hour  Intake    480 ml  Output   2500 ml  Net  -2020 ml     Physical Exam: General:  Fatigued  appearing. No resp difficulty HEENT: normal Neck: supple. JVP ~8-9 . Carotids 2+ bilat; no bruits. No lymphadenopathy or thryomegaly appreciated. Cor: PMI nondisplaced. Tachycardic. Regular  rate & rhythm. No rubs, or murmurs. + S3  Lungs: clear but decreased at bases  Abdomen: soft, nontender, nondistended. No hepatosplenomegaly. No bruits or masses. Good bowel sounds. Extremities: no cyanosis, clubbing, rash, edema.  Multiple tattoos Neuro: alert & orientedx3, cranial nerves grossly intact. moves all 4 extremities w/o difficulty. Affect pleasant  Telemetry: Sinus Tach 100-110s   Labs: Basic Metabolic Panel:  Recent Labs Lab 10/22/14 0527 10/23/14 0835 10/24/14 1000 10/26/14 1119 10/27/14 0927  NA 135 139 139 138  --   K 3.1* 3.3* 3.0* 4.1  --   CL 104 106 104 108  --   CO2 --   GLUCOSE 95 78 98 124*  --   BUN --   CREATININE 0.70 0.60 0.51 0.60  --   CALCIUM 8.9 9.2 9.6 9.5  --   MG  --   --   --   --  1.7  PHOS  --   --   --   --  4.1    Liver Function Tests:  Recent Labs Lab 10/22/14 0527 10/23/14 0835 10/24/14 1000 10/26/14 1119  AST ALT ALKPHOS 64 70 87 86  BILITOT 0.3 0.2* 0.2* 0.2*  PROT 4.8* 4.9* 5.5* 5.9*  ALBUMIN 2.3* 2.4* 2.7* 2.9*   No results for input(s): LIPASE, AMYLASE in the last 168 hours. No results for input(s): AMMONIA in the last 168 hours.  CBC:  Recent Labs Lab 10/21/14 1250 10/22/14 0527 10/23/14 0835 10/24/14 1000  WBC 15.7* 14.2* 8.5 10.2  HGB 11.6* 9.4* 9.1* 10.7*  HCT 32.9* 27.3* 26.5*  31.3*  MCV 84.6 85.8 86.6 86.0  PLT 156 161 136* 159    Cardiac Enzymes: No results for input(s): CKTOTAL, CKMB, CKMBINDEX, TROPONINI in the last 168 hours.  BNP: BNP (last 3 results)  Recent Labs  10/27/14 1135  BNP 2719.1*    ProBNP (last 3 results) No results for input(s): PROBNP in the last 8760 hours.    Other results: ECG; Sinus tach 114.  Narrow QRS   Imaging: Dg Chest 2 View  10/26/2014   CLINICAL DATA:  Dyspnea, congestive heart failure  EXAM: CHEST  2 VIEW  COMPARISON:  10/23/2014  FINDINGS: Cardiomediastinal silhouette is stable. Residual mild interstitial prominence/interstitial edema with slight improvement in aeration. Persistent streaky left basilar atelectasis or infiltrate.  IMPRESSION: Mild improved aeration. Residual mild interstitial edema. Persistent streaky left  basilar atelectasis or infiltrate.   Electronically Signed   By: Natasha MeadLiviu  Pop M.D.   On: 10/26/2014 11:19   Ct Angio Chest Pe W/cm &/or Wo Cm  10/27/2014   CLINICAL DATA:  Shortness of breath and discomfort in the chest after Cesarean section delivery October 21, 2014.  EXAM: CT ANGIOGRAPHY CHEST WITH CONTRAST  TECHNIQUE: Multidetector CT imaging of the chest was performed using the standard protocol during bolus administration of intravenous contrast. Multiplanar CT image reconstructions and MIPs were obtained to evaluate the vascular anatomy.  CONTRAST:  100mL OMNIPAQUE IOHEXOL 350 MG/ML SOLN  COMPARISON:  None.  FINDINGS: THORACIC INLET/BODY WALL:  No acute abnormality.  MEDIASTINUM:  Borderline cardiomegaly. No pericardial effusion. No aortic aneurysm or dissection. No pulmonary embolism.  Mild mediastinal and hilar (asymmetric to the right) lymphadenopathy, with the right peritracheal node measuring up to 14 mm. No remote chest imaging to determine stability.  LUNG WINDOWS:  There is diffuse interlobular septal thickening, bronchial cuffing, and symmetric airspace opacity. Findings consistent with pulmonary edema. Moderate, layering pleural effusions.  There is a rounded 6 mm nodule in the right lower lobe. Given patient's young age, negative malignancy history, and isolated finding this is considered incidental.  UPPER ABDOMEN:  No acute findings.  OSSEOUS:  Negative.  Review of the MIP images confirms the above findings.  IMPRESSION: 1. Negative for pulmonary embolism. 2. Pulmonary edema and moderate layering pleural effusions. 3. Mild hilar and mediastinal lymphadenopathy. Suggest outpatient workup for sarcoidosis and three-month follow-up chest x-ray or CT if needed.   Electronically Signed   By: Marnee SpringJonathon  Watts M.D.   On: 10/27/2014 10:07      Medications:     Scheduled Medications: . carvedilol  6.25 mg Oral BID WC  . furosemide  40 mg Intravenous Daily  . ipratropium-albuterol  3 mL  Nebulization Q6H  . LORazepam  1 mg Oral TID  . NIFEdipine  60 mg Oral Daily  . potassium chloride  20 mEq Oral TID  . prenatal multivitamin  1 tablet Oral Q1200  . ramipril  2.5 mg Oral Daily  . senna-docusate  2 tablet Oral Q24H     Infusions: . naLOXone (NARCAN) adult infusion for PRURITIS       PRN Medications:  acetaminophen, budesonide, witch hazel-glycerin **AND** dibucaine, labetalol, lanolin, LORazepam, menthol-cetylpyridinium, nalbuphine **OR** nalbuphine, naLOXone (NARCAN) adult infusion for PRURITIS, naloxone **AND** sodium chloride, ondansetron (ZOFRAN) IV, oxyCODONE-acetaminophen, oxyCODONE-acetaminophen, simethicone, zolpidem   Assessment:   1. Preeclampsia complicating hypertension 2.  H/O cesarean section 3.  Acute respiratory failure with hypoxia 4.  Acute systolic CHF (congestive heart failure) ECHO 10/27/2014  EF 15%  5.  Peripartum cardiomyopathy, postpartum 6.  Hilar adenopathy   Plan/Discussion:     Transferred from [facility] to [facility] for HF management. New onset acute systolic heart failure - peripartum cardiomyopathy.   Brisk diuresis overnight. Switch to po diuretics. Lasix 40 mg daily + 12.5 mg spironolactone daily. Tachycardic today . Cut back carvedilol to 3.125 mg twice a day. Add dig 0.125 mg daily. Stop ramipril due to cost and start lisinopril 5 mg twice a day.  BMET pending.   Stop procardia. Follow SBP.   Place PICC   Length of Stay: 8 Advanced Heart Failure Team Pager [phone] (M-F; 7a - 4p)  Please contact [facility] for night-coverage after hours (4p -7a ) and weekends on [email or web address]  Patient seen and examined with Tonye Becket, NP. We discussed all aspects of the encounter. I agree with the assessment and plan as stated above.   Volume status much improved but remains very tenuous. BP now marginal. She is tachycardic with S3. Agree with digoxin, spiro and lisinopril. Cut carvedilol to 3.125 bid.  Stop procardia.  Place picc.  Take IV out of foot.   Kainoa Swoboda,MD 9:03 AM

## 2014-10-28 NOTE — Progress Notes (Signed)
   10/28/14 1200  Clinical Encounter Type  Visited With Other (Comment)  Referral From Chaplain  Consult/Referral To Chaplain   [facility] chapl[facility] Dyanne CarrelKaty Claussen and Nageeziharlie Lumpkin; referred again to [facility] chaplain Ray Watlington to assume care of Ceonna, mom Eunice BlaseDebbie, and family on that campus.  Meanwhile, Spiritual Care continues to follow family through baby in NICU.  Please also page as needs arise:  24[facility]7 [facility] cha[facility]:  [phone] 24/7 [facility] cha[facility]:  [phone]  Thank you!  [address]Chaplain Abegail Kloeppel [place], [place] [phone]

## 2014-10-28 NOTE — Care Management Note (Signed)
    Page 1 of 1   10/28/2014     8:54:50 AM CARE MANAGEMENT NOTE 10/28/2014  Patient:  Lauren Sharp,Lauren Sharp   Account Number:  [id number]  Date Initiated:  10/28/2014  Documentation initiated by:  Junius CreamerWELL,DEBBIE  Subjective/Objective Assessment:   adm post c sect w htn and cardiomyopathy     Action/Plan:   lives at home   Anticipated DC Date:     Anticipated DC Plan:  HOME W HOME HEALTH SERVICES         Choice offered to / List presented to:             Status of service:   Medicare Important Message given?   (If response is "NO", the following Medicare IM given date fields will be blank) Date Medicare IM given:   Medicare IM given by:   Date Additional Medicare IM given:   Additional Medicare IM given by:    Discharge Disposition:    Per UR Regulation:  Reviewed for med. necessity/level of care/duration of stay  If discussed at Long Length of Stay Meetings, dates discussed:    Comments:

## 2014-10-28 NOTE — Progress Notes (Signed)
Family member brought in food from outside.instructed that she is on fluid restriction and to limit salt in food because of CHF.pt. and family are perceptive to education.

## 2014-10-28 NOTE — Progress Notes (Deleted)
Had another episode of heart rate of 160's, c/o of lightheadedness, and slight chestpain for a sec. MD aware seen pt at once. Continue to monitor.

## 2014-10-28 NOTE — Progress Notes (Signed)
PULMONARY / CRITICAL CARE MEDICINE   Name: Lauren Sharp MRN: [id number] DOB: Dec 14, 1990    ADMISSION DATE:  10/19/2014 CONSULTATION DATE:  3/21  REFERRING MD :  Meisinger  REASON: acute respiratory distress  PT PROFILE:  59 F admitted 3/20 with pre-eclampsia manifesting as hypertension, HA and proteinuria. She was admitted for magnesium infusion. On the evening of 3/21 she developed severe respiratory distress, pulm edema pattern on CXR and resp acidosis. Her symptoms improved some with diuresis but she remained markedly SOB. PCCM asked to evaluate    SIGNIFICANT EVENTS: 3/21 in respiratory failure due to acute pulmonary edema 3/22 Improved with diureses of 5 liters.   SUBJECTIVE/OVERNIGHT/INTERVAL HX 10/27/2014 : recalled pulmonary - due to persistent hypoxemia needing 2L . Desaturates otherwise. Off Mag. Last seen by [facility] 10/21/14. Review of chars indicate : persistent dyspnea walking to restroom with dry cough and reports of cxr c/w waxin/waning pulmonary edema. High BP continues to be a problem Cards consult called - echo ordered. Patient on low dose lasix.  Very anxious and no fever.  VITAL SIGNS: Temp:  [97.8 F (36.6 C)-99.1 F (37.3 C)] 99.1 F (37.3 C) (03/29 0800) Pulse Rate:  [89-123] 110 (03/29 0800) Resp:  [18-22] 22 (03/28 1945) BP: (102-184)/(48-139) 121/59 mmHg (03/29 0800) SpO2:  [92 %-100 %] 92 % (03/29 0800) FiO2 (%):  [100 %] 100 % (03/28 1612)  HEMODYNAMICS:   VENTILATOR SETTINGS: Vent Mode:  [-]  FiO2 (%):  [100 %] 100 % INTAKE / OUTPUT:  Intake/Output Summary (Last 24 hours) at 10/28/14 1052 Last data filed at 10/28/14 0800  Gross per 24 hour  Intake    580 ml  Output   2200 ml  Net  -1620 ml   PHYSICAL EXAMINATION: General: Alert, oriented and following commands. Neuro: no focal deficits. Sitting in wheel chair. Denies complaints Head: NCAT Eyes: PERRL, EOM-I Neck: supple, +JVD ENT: WNL Cardiovascular: Tachy, regular, no M noted Lungs:  Decrease BS bibasilarly but otherwise clear Abdomen:  obese, nontender, diminished BS Ext: trace symmetric pretibial edema  LABS:  PULMONARY  Recent Labs Lab 10/23/14 2035  PHART 7.491*  PCO2ART 30.3*  PO2ART 70.8*  HCO3 22.9  TCO2 23.8  O2SAT 97.0    CBC  Recent Labs Lab 10/23/14 0835 10/24/14 1000 10/28/14 0859  HGB 9.1* 10.7* 10.8*  HCT 26.5* 31.3* 31.8*  WBC 8.5 10.2 16.0*  PLT 136* 159 217    COAGULATION No results for input(s): INR in the last 168 hours.  CARDIAC  No results for input(s): TROPONINI in the last 168 hours. No results for input(s): PROBNP in the last 168 hours.   CHEMISTRY  Recent Labs Lab 10/22/14 0527 10/23/14 0835 10/24/14 1000 10/26/14 1119 10/27/14 0927 10/28/14 0859  NA 135 139 139 138  --  137  K 3.1* 3.3* 3.0* 4.1  --  4.3  CL 104 106 104 108  --  106  CO2 --  23  GLUCOSE 95 78 98 124*  --  108*  BUN --  16  CREATININE 0.70 0.60 0.51 0.60  --  0.76  CALCIUM 8.9 9.2 9.6 9.5  --  10.2  MG  --   --   --   --  1.7  --   PHOS  --   --   --   --  4.1  --    Estimated Creatinine Clearance: 124.1 mL/min (by C-G formula based on Cr of 0.76).  LIVER  Recent Labs Lab 10/22/14 0527 10/23/14 0835 10/24/14 1000 10/26/14 1119  AST 20 21 27 21   ALT 14 14 17 15   ALKPHOS 64 70 87 86  BILITOT 0.3 0.2* 0.2* 0.2*  PROT 4.8* 4.9* 5.5* 5.9*  ALBUMIN 2.3* 2.4* 2.7* 2.9*     INFECTIOUS  Recent Labs Lab 10/27/14 0927 10/27/14 1135  LATICACIDVEN  --  1.7  PROCALCITON <0.10  --      ENDOCRINE CBG (last 3)  No results for input(s): GLUCAP in the last 72 hours.       IMAGING x48h Dg Chest 2 View  10/26/2014   CLINICAL DATA:  Dyspnea, congestive heart failure  EXAM: CHEST  2 VIEW  COMPARISON:  10/23/2014  FINDINGS: Cardiomediastinal silhouette is stable. Residual mild interstitial prominence/interstitial edema with slight improvement in aeration. Persistent streaky left basilar atelectasis or  infiltrate.  IMPRESSION: Mild improved aeration. Residual mild interstitial edema. Persistent streaky left basilar atelectasis or infiltrate.   Electronically Signed   By: Natasha MeadLiviu  Pop M.D.   On: 10/26/2014 11:19   Ct Angio Chest Pe W/cm &/or Wo Cm  10/27/2014   CLINICAL DATA:  Shortness of breath and discomfort in the chest after Cesarean section delivery October 21, 2014.  EXAM: CT ANGIOGRAPHY CHEST WITH CONTRAST  TECHNIQUE: Multidetector CT imaging of the chest was performed using the standard protocol during bolus administration of intravenous contrast. Multiplanar CT image reconstructions and MIPs were obtained to evaluate the vascular anatomy.  CONTRAST:  100mL OMNIPAQUE IOHEXOL 350 MG/ML SOLN  COMPARISON:  None.  FINDINGS: THORACIC INLET/BODY WALL:  No acute abnormality.  MEDIASTINUM:  Borderline cardiomegaly. No pericardial effusion. No aortic aneurysm or dissection. No pulmonary embolism.  Mild mediastinal and hilar (asymmetric to the right) lymphadenopathy, with the right peritracheal node measuring up to 14 mm. No remote chest imaging to determine stability.  LUNG WINDOWS:  There is diffuse interlobular septal thickening, bronchial cuffing, and symmetric airspace opacity. Findings consistent with pulmonary edema. Moderate, layering pleural effusions.  There is a rounded 6 mm nodule in the right lower lobe. Given patient's young age, negative malignancy history, and isolated finding this is considered incidental.  UPPER ABDOMEN:  No acute findings.  OSSEOUS:  Negative.  Review of the MIP images confirms the above findings.  IMPRESSION: 1. Negative for pulmonary embolism. 2. Pulmonary edema and moderate layering pleural effusions. 3. Mild hilar and mediastinal lymphadenopathy. Suggest outpatient workup for sarcoidosis and three-month follow-up chest x-ray or CT if needed.   Electronically Signed   By: Marnee SpringJonathon  Watts M.D.   On: 10/27/2014 10:07        ASSESSMENT / PLAN:  PULMONARY A: Acute  respiratory failure with Pulmonary edema Wheezing - likely "cardiac asthma" Smoker Bilateral pleural effusions Cardiomegally  This is all driven by post-partum heart failure.  I reviewed the CT myself, no PEs noted but does have pulmonary edema and bilateral pleural effusion.  P:   No indication for thora. Recommend more aggressive diureses as BP allows ECHO with EF of 15% on the heart failure service. Correct electrolytes. Supp O2. Restart Scheduled nebulized steroids and BDs. PCT is <.10 making infection very unlikely, keep off abx. Will likely need an ambulatory desaturation study prior to discharge to qualify for home O2 which I anticipate will be a temporary intervention. Titrate O2 for sat of 92-95%.  Alyson ReedyWesam G. Danielle Mink, M.D. [facility]. Pager: [phone]. After hours pager: [phone].  10/28/2014 10:52 AM

## 2014-10-28 NOTE — Progress Notes (Signed)
Called peds for pt. Asking what times son could visit.  If pt wakes up and wants to see son, just call [phone] "[id number]".   Pt resting in bed with kpad. kpad is helping lower back.  Vs stable.  Will continue to monitor. Karena Addisonoro, Leonell Lobdell T

## 2014-10-28 NOTE — Progress Notes (Signed)
Notified Md about pts refusal  Of iv Magnesium. Educated pt.  Pt did agree to take PO mag.   Will continue to monitor. Lauren Sharp, [name] T

## 2014-10-28 NOTE — Progress Notes (Signed)
Peripherally Inserted Central Catheter/Midline Placement  The IV Nurse has discussed with the patient and/or persons authorized to consent for the patient, the purpose of this procedure and the potential benefits and risks involved with this procedure.  The benefits include less needle sticks, lab draws from the catheter and patient may be discharged home with the catheter.  Risks include, but not limited to, infection, bleeding, blood clot (thrombus formation), and puncture of an artery; nerve damage and irregular heat beat.  Alternatives to this procedure were also discussed.  PICC/Midline Placement Documentation        Vevelyn PatDuncan, Ashaya Raftery Jean 10/28/2014, 1:25 PM

## 2014-10-29 DIAGNOSIS — J810 Acute pulmonary edema: Secondary | ICD-10-CM | POA: Insufficient documentation

## 2014-10-29 DIAGNOSIS — N390 Urinary tract infection, site not specified: Secondary | ICD-10-CM

## 2014-10-29 LAB — BASIC METABOLIC PANEL
Anion gap: 10 (ref 5–15)
BUN: 13 mg/dL (ref 6–23)
CALCIUM: 10.1 mg/dL (ref 8.4–10.5)
CHLORIDE: 102 mmol/L (ref 96–112)
CO2: 25 mmol/L (ref 19–32)
CREATININE: 0.69 mg/dL (ref 0.50–1.10)
GFR calc Af Amer: >90 mL/min (ref >90)
GLUCOSE: 148 mg/dL — AB (ref 70–99)
POTASSIUM: 4 mmol/L (ref 3.5–5.1)
Sodium: 137 mmol/L (ref 135–145)

## 2014-10-29 LAB — CARBOXYHEMOGLOBIN
CARBOXYHEMOGLOBIN: 1.8 % — AB (ref 0.5–1.5)
METHEMOGLOBIN: 0.9 % (ref 0.0–1.5)
O2 Saturation: 98.2 %
TOTAL HEMOGLOBIN: 10.7 g/dL — AB (ref 12.0–16.0)

## 2014-10-29 MED ORDER — FUROSEMIDE 20 MG PO TABS
20.0000 mg | ORAL_TABLET | Freq: Every day | ORAL | Status: DC
  Administered 2014-10-30 – 2014-10-31 (×2): 20 mg via ORAL
  Filled 2014-10-29 (×2): qty 1

## 2014-10-29 MED ORDER — OXYCODONE HCL 5 MG PO TABS
10.0000 mg | ORAL_TABLET | ORAL | Status: AC | PRN
  Administered 2014-10-29 (×4): 10 mg via ORAL
  Filled 2014-10-29 (×4): qty 2

## 2014-10-29 MED ORDER — LISINOPRIL 10 MG PO TABS
10.0000 mg | ORAL_TABLET | Freq: Two times a day (BID) | ORAL | Status: DC
  Administered 2014-10-29 – 2014-10-30 (×2): 10 mg via ORAL
  Filled 2014-10-29 (×3): qty 1

## 2014-10-29 MED ORDER — CEFTRIAXONE SODIUM IN DEXTROSE 20 MG/ML IV SOLN
1.0000 g | INTRAVENOUS | Status: DC
  Administered 2014-10-29 – 2014-10-30 (×3): 1 g via INTRAVENOUS
  Filled 2014-10-29 (×3): qty 50

## 2014-10-29 MED ORDER — SPIRONOLACTONE 25 MG PO TABS
25.0000 mg | ORAL_TABLET | Freq: Every day | ORAL | Status: DC
  Administered 2014-10-29: 12.5 mg via ORAL
  Administered 2014-10-30 – 2014-10-31 (×2): 25 mg via ORAL
  Filled 2014-10-29 (×3): qty 1

## 2014-10-29 MED ORDER — LISINOPRIL 5 MG PO TABS
5.0000 mg | ORAL_TABLET | Freq: Once | ORAL | Status: AC
  Administered 2014-10-29: 5 mg via ORAL
  Filled 2014-10-29: qty 1

## 2014-10-29 NOTE — Progress Notes (Signed)
Advanced Heart Failure Rounding Note   Subjective:    Lauren Sharp is a 24 y.o. year old female G1P0 who was admitted 03/21 for PIH, pre-eclampsia. She was at [redacted] weeks gestation. She was treated medically but developed pulmonary edema and a CCM consult was called. She had a C-section on 10/21/2014. She notes BP 180/120 for months during her pregnancy.   Echocardiogram showed an EF of 15% with global hypokinesis, LV & RV failure, pulmonary hypertension, results below.  She was transferred from [facility] to [facility] for HF management on 3/28. She was diuresed with IV lasix, carvedilol started as well as altace.   She diuresed 1.7 liters overnight. Weight down 2 lbs from yesterday (18 pounds since admission). Breathing much better. She denies dyspnea when walking to the bathroom. No longer orthopneic. BP controlled. PICC placed yesterday.  CVP 4-6 Co-ox 72%  She complains of lower abdominal pain and right flank pain. UA with large leuks, many bacteria. Started on Ceftriaxone.    Objective:   Weight Range:  Vital Signs:   Temp:  [98 F (36.7 C)-99.4 F (37.4 C)] 99.4 F (37.4 C) (03/30 0805) Pulse Rate:  [50-104] 50 (03/30 0335) Resp:  [18-20] 19 (03/30 0805) BP: (112-163)/(76-106) 112/78 mmHg (03/30 0805) SpO2:  [94 %-98 %] 97 % (03/30 0805) Weight:  [202 lb 9.6 oz (91.9 kg)-204 lb 12.9 oz (92.9 kg)] 202 lb 9.6 oz (91.9 kg) (03/30 0335) Last BM Date: 10/23/14  Weight change: Filed Weights   10/24/14 0515 10/28/14 1345 10/29/14 0335  Weight: 215 lb 6.4 oz (97.705 kg) 204 lb 12.9 oz (92.9 kg) 202 lb 9.6 oz (91.9 kg)    Intake/Output:   Intake/Output Summary (Last 24 hours) at 10/29/14 0842 Last data filed at 10/29/14 0400  Gross per 24 hour  Intake    170 ml  Output   1650 ml  Net  -1480 ml     Physical Exam: General:  Fatigued appearing, sitting up in bed, NAD HEENT: [place]/AT, EOMI, sclera anicteric, mucus membranes moist Neck: supple. JVP ~5 . Carotids 2+ bilat; no bruits. No  lymphadenopathy or thryomegaly appreciated. Cor: PMI nondisplaced. Tachycardic in 100s. Regular  rate & rhythm. No rubs, or murmurs. + S3  Lungs: clear but decreased at bases  Abdomen: BS+, soft, nontender, nondistended. No hepatosplenomegaly. No bruits or masses. Horizontal surgical incision from C-section present in lower abdomen, covered by clean bandage, no erythema or tenderness. Extremities: no cyanosis, clubbing, rash, edema. Multiple tattoos Neuro: alert & orientedx3, cranial nerves grossly intact. moves all 4 extremities w/o difficulty. Affect pleasant  Telemetry: Sinus Tach 100-110s   Labs: Basic Metabolic Panel:  Recent Labs Lab 10/23/14 0835 10/24/14 1000 10/26/14 1119 10/27/14 0927 10/28/14 0859 10/29/14 0409  NA 139 139 138  --  137 137  K 3.3* 3.0* 4.1  --  4.3 4.0  CL 106 104 108  --  106 102  CO2 --  23 25  GLUCOSE 78 98 124*  --  108* 148*  BUN --  16 13  CREATININE 0.60 0.51 0.60  --  0.76 0.69  CALCIUM 9.2 9.6 9.5  --  10.2 10.1  MG  --   --   --  1.7  --   --   PHOS  --   --   --  4.1  --   --     Liver Function Tests:  Recent Labs Lab 10/23/14 0835 10/24/14 1000 10/26/14 1119  AST ALT ALKPHOS 70 87 86  BILITOT 0.2* 0.2* 0.2*  PROT 4.9* 5.5* 5.9*  ALBUMIN 2.4* 2.7* 2.9*   CBC:  Recent Labs Lab 10/23/14 0835 10/24/14 1000 10/28/14 0859  WBC 8.5 10.2 16.0*  HGB 9.1* 10.7* 10.8*  HCT 26.5* 31.3* 31.8*  MCV 86.6 86.0 85.9  PLT 136* 159 217   BNP: BNP (last 3 results)  Recent Labs  10/27/14 1135  BNP 2719.1*    Other results: ECG; Sinus tach 114.  Narrow QRS   Imaging: Ct Angio Chest Pe W/cm &/or Wo Cm  10/27/2014   CLINICAL DATA:  Shortness of breath and discomfort in the chest after Cesarean section delivery October 21, 2014.  EXAM: CT ANGIOGRAPHY CHEST WITH CONTRAST  TECHNIQUE: Multidetector CT imaging of the chest was performed using the standard protocol during bolus administration of  intravenous contrast. Multiplanar CT image reconstructions and MIPs were obtained to evaluate the vascular anatomy.  CONTRAST:  OMNIPAQUE IOHEXOL 350 MG/ML SOLN  COMPARISON:  None.  FINDINGS: THORACIC INLET/BODY WALL:  No acute abnormality.  MEDIASTINUM:  Borderline cardiomegaly. No pericardial effusion. No aortic aneurysm or dissection. No pulmonary embolism.  Mild mediastinal and hilar (asymmetric to the right) lymphadenopathy, with the right peritracheal node measuring up to 14 mm. No remote chest imaging to determine stability.  LUNG WINDOWS:  There is diffuse interlobular septal thickening, bronchial cuffing, and symmetric airspace opacity. Findings consistent with pulmonary edema. Moderate, layering pleural effusions.  There is a rounded 6 mm nodule in the right lower lobe. Given patient's young age, negative malignancy history, and isolated finding this is considered incidental.  UPPER ABDOMEN:  No acute findings.  OSSEOUS:  Negative.  Review of the MIP images confirms the above findings.  IMPRESSION: 1. Negative for pulmonary embolism. 2. Pulmonary edema and moderate layering pleural effusions. 3. Mild hilar and mediastinal lymphadenopathy. Suggest outpatient workup for sarcoidosis and three-month follow-up chest x-ray or CT if needed.   Electronically Signed   By: Marnee Spring M.D.   On: 10/27/2014 10:07     Medications:     Scheduled Medications: . carvedilol  3.125 mg Oral BID WC  . cefTRIAXone (ROCEPHIN)  IV  1 g Intravenous Q24H  . citalopram  10 mg Oral Daily  . digoxin  0.25 mg Oral Daily  . furosemide  40 mg Oral Daily  . ipratropium-albuterol  3 mL Nebulization Q6H  . lisinopril  5 mg Oral BID  . magnesium oxide  400 mg Oral BID  . prenatal multivitamin  1 tablet Oral Q1200  . sodium chloride  10-40 mL Intracatheter Q12H  . sodium chloride  3 mL Intravenous Q12H  . spironolactone  12.5 mg Oral Daily    Infusions:    PRN Medications: sodium chloride, acetaminophen,  witch hazel-glycerin **AND** dibucaine, lanolin, LORazepam, ondansetron (ZOFRAN) IV, oxyCODONE, senna-docusate, simethicone, sodium chloride, sodium chloride, traMADol, zolpidem   Assessment:   1. Preeclampsia complicating hypertension 2.  H/O cesarean section 3.  Acute respiratory failure with hypoxia 4.  Acute systolic CHF (congestive heart failure) ECHO 10/27/2014  EF 15%  5.  Peripartum cardiomyopathy, postpartum 6.  Hilar adenopathy 7. UTI  Plan/Discussion:     Transferred from [facility] to [facility] for HF management. New onset acute systolic heart failure - peripartum cardiomyopathy.   Continues to be mildly tachycardic in 100-110s. She diuresed 1.7 L yesterday. Kidney function stable at baseline 0.7. Continue Lasix 40 mg PO daily. Also  continue with Coreg 3.125 mg BID, Digoxin 0.25 mg daily, Lisinopril 5 mg BID, and Spironolactone 12.5 mg daily.   Treating UTI with Ceftriaxone. Awaiting urine culture sensitivities and then can switch to oral therapy.   Get PT on board to help with ambulation.    Consider stopping Tramadol since patient states does not help with pain.   Length of Stay: 9 Advanced Heart Failure Team Pager [phone] (M-F; 7a - 4p)  Please contact [facility] for night-coverage after hours (4p -7a ) and weekends on [email or web address]   Rivet, Carly,MD 8:42 AM   Patient seen and examined with Dr. Beckie Saltsivet. We discussed all aspects of the encounter. I agree with the assessment and plan as stated above.   Volume status looks good CVP 4-6. Co-ox 72% Remains tachycardic. BP up. Increase lisinopril to 10 bid and spiro to 25 daily. Can decrease lasix to 20 daily. Possible titration of b-blocker tomorrow. Treat UTI. Await cx. If flank pain persists will get renal u/s to look for pyelo/perinephric abscess.   Daniel Bensimhon,MD 9:20 AM

## 2014-10-29 NOTE — Progress Notes (Signed)
Pt rocking back and forth in bed, moaning.  When asked  Stated "medications not helping".  Notified MD.  New orders received. Will continue to monitor. Karena Addisonoro, Jakobi Thetford T

## 2014-10-29 NOTE — Progress Notes (Addendum)
After speaking with dr benshimon earlier today (1400) he ok with patient going to pediatric unit to spend time with her newborn son.  Offered patient to go at that time and she said "not right now, maybe later".  She requested to go at 5:30 pm  Explained to her due to end of shift she would not be able to stay long. Took her and her mother over to peds unit via wheelchair on remote telemetry monitor and will return to get her at 6:15.    Patient back on unit at 1845, no signs of distress or discomfort, no complaints of pain, was able to stay with her son and mother for visit on pediatric unit.

## 2014-10-29 NOTE — Progress Notes (Signed)
PULMONARY / CRITICAL CARE MEDICINE   Name: Lauren Sharp MRN: [id number] DOB: 12/22/1990    ADMISSION DATE:  10/19/2014 CONSULTATION DATE:  3/21  REFERRING MD :  Meisinger  REASON: acute respiratory distress  PT PROFILE:  [age] F admitted 3/20 with pre-eclampsia manifesting as hypertension, HA and proteinuria. She was admitted for magnesium infusion. On the evening of 3/21 she developed severe respiratory distress, pulm edema pattern on CXR and resp acidosis. Her symptoms improved some with diuresis but she remained markedly SOB. PCCM asked to evaluate    SIGNIFICANT EVENTS: 3/21 in respiratory failure due to acute pulmonary edema 3/22 Improved with diureses of 5 liters. 3/30 breathing better  SUBJECTIVE/OVERNIGHT/INTERVAL HX 10/27/2014 : recalled pulmonary - due to persistent hypoxemia needing 2L . Desaturates otherwise. Off Mag. [facility] Review of chars indicate : persistent dyspnea walking to restroom with dry cough and reports of cxr c/w waxin/waning pulmonary edema. High BP continues to be a problem Cards consult called - echo ordered. Patient on low dose lasix.  Very anxious and no fever.  VITAL SIGNS: Temp:  [98 F (36.7 C)-99.4 F (37.4 C)] 99.4 F (37.4 C) (03/30 0805) Pulse Rate:  [50-107] 107 (03/30 0853) Resp:  [18-20] 19 (03/30 0805) BP: (112-163)/(76-106) 112/78 mmHg (03/30 0853) SpO2:  [94 %-98 %] 97 % (03/30 0805) Weight:  [202 lb 9.6 oz (91.9 kg)-204 lb 12.9 oz (92.9 kg)] 202 lb 9.6 oz (91.9 kg) (03/30 0335)  HEMODYNAMICS: CVP:  [4 mmHg-7 mmHg] 6 mmHg VENTILATOR SETTINGS:   INTAKE / OUTPUT:  Intake/Output Summary (Last 24 hours) at 10/29/14 0903 Last data filed at 10/29/14 0858  Gross per 24 hour  Intake    180 ml  Output   1300 ml  Net  -1120 ml   PHYSICAL EXAMINATION: General: Alert, oriented and following commands. Neuro: no focal deficits.  "I'm better" Head: NCAT Eyes: PERRL, EOM-I Neck: supple, +JVD ENT: WNL Cardiovascular:  Tachy, regular, no M noted Lungs: Decrease BS bibasilarly but otherwise clear Abdomen:  obese, nontender, diminished BS Ext: trace symmetric pretibial edema  LABS:  PULMONARY  Recent Labs Lab 10/23/14 2035 10/28/14 1850 10/29/14 0520  PHART 7.491*  --   --   PCO2ART 30.3*  --   --   PO2ART 70.8*  --   --   HCO3 22.9  --   --   TCO2 23.8  --   --   O2SAT 97.0 71.7 98.2    CBC  Recent Labs Lab 10/23/14 0835 10/24/14 1000 10/28/14 0859  HGB 9.1* 10.7* 10.8*  HCT 26.5* 31.3* 31.8*  WBC 8.5 10.2 16.0*  PLT 136* 159 217    COAGULATION No results for input(s): INR in the last 168 hours.  CARDIAC  No results for input(s): TROPONINI in the last 168 hours. No results for input(s): PROBNP in the last 168 hours.   CHEMISTRY  Recent Labs Lab 10/23/14 0835 10/24/14 1000 10/26/14 1119 10/27/14 0927 10/28/14 0859 10/29/14 0409  NA 139 139 138  --  137 137  K 3.3* 3.0* 4.1  --  4.3 4.0  CL 106 104 108  --  106 102  CO2 26 27 21   --  23 25  GLUCOSE 78 98 124*  --  108* 148*  BUN 19 9 22   --  16 13  CREATININE 0.60 0.51 0.60  --  0.76 0.69  CALCIUM 9.2 9.6 9.5  --  10.2 10.1  MG  --   --   --  1.7  --   --   PHOS  --   --   --  4.1  --   --    Estimated Creatinine Clearance: 120.2 mL/min (by C-G formula based on Cr of 0.69).   LIVER  Recent Labs Lab 10/23/14 0835 10/24/14 1000 10/26/14 1119  AST ALT ALKPHOS 70 87 86  BILITOT 0.2* 0.2* 0.2*  PROT 4.9* 5.5* 5.9*  ALBUMIN 2.4* 2.7* 2.9*     INFECTIOUS  Recent Labs Lab 10/27/14 0927 10/27/14 1135  LATICACIDVEN  --  1.7  PROCALCITON <0.10  --      ENDOCRINE CBG (last 3)  No results for input(s): GLUCAP in the last 72 hours.       IMAGING x48h Ct Angio Chest Pe W/cm &/or Wo Cm  10/27/2014   CLINICAL DATA:  Shortness of breath and discomfort in the chest after Cesarean section delivery October 21, 2014.  EXAM: CT ANGIOGRAPHY CHEST WITH CONTRAST  TECHNIQUE: Multidetector  CT imaging of the chest was performed using the standard protocol during bolus administration of intravenous contrast. Multiplanar CT image reconstructions and MIPs were obtained to evaluate the vascular anatomy.  CONTRAST:  OMNIPAQUE IOHEXOL 350 MG/ML SOLN  COMPARISON:  None.  FINDINGS: THORACIC INLET/BODY WALL:  No acute abnormality.  MEDIASTINUM:  Borderline cardiomegaly. No pericardial effusion. No aortic aneurysm or dissection. No pulmonary embolism.  Mild mediastinal and hilar (asymmetric to the right) lymphadenopathy, with the right peritracheal node measuring up to 14 mm. No remote chest imaging to determine stability.  LUNG WINDOWS:  There is diffuse interlobular septal thickening, bronchial cuffing, and symmetric airspace opacity. Findings consistent with pulmonary edema. Moderate, layering pleural effusions.  There is a rounded 6 mm nodule in the right lower lobe. Given patient's young age, negative malignancy history, and isolated finding this is considered incidental.  UPPER ABDOMEN:  No acute findings.  OSSEOUS:  Negative.  Review of the MIP images confirms the above findings.  IMPRESSION: 1. Negative for pulmonary embolism. 2. Pulmonary edema and moderate layering pleural effusions. 3. Mild hilar and mediastinal lymphadenopathy. Suggest outpatient workup for sarcoidosis and three-month follow-up chest x-ray or CT if needed.   Electronically Signed   By: Marnee Spring M.D.   On: 10/27/2014 10:07     Intake/Output Summary (Last 24 hours) at 10/29/14 0905 Last data filed at 10/29/14 0858  Gross per 24 hour  Intake    180 ml  Output   1300 ml  Net  -1120 ml      ASSESSMENT / PLAN:  PULMONARY A: Acute respiratory failure with Pulmonary edema(improved) Wheezing - likely "cardiac asthma"(resolved with negative i/o) Smoker Bilateral pleural effusions Cardiomegally  This is all driven by post-partum heart failure. Dr. Molli Knock reviewed the CT, no PEs noted but does have pulmonary  edema and bilateral pleural effusion.  P:   No indication for thora. Recommend more aggressive diureses as BP allows(neg 1100) ECHO with EF of 15% on the heart failure service. Correct electrolytes as needed Supp O2. Restart Scheduled nebulized steroids and BDs. PCT is <.10 making infection very unlikely, keep off abx. Will likely need an ambulatory desaturation study prior to discharge to qualify for home O2 which I anticipate will be a temporary intervention. Titrate O2 for sat of 92-95%.  Brett Canales Minor ACNP [facility] PCCM Pager [phone] till 3 pm If no answer page [phone]  Appears better, continue diureses per cardiology, ambulatory desaturation study prior to discharge  as above.  Less crackles today on exam.  CXR I reviewed myself with cardiomegaly and persistent but improving pulmonary edema.  PCCM will sign off, please call back if needed.  Patient seen and examined, agree with above note.  I dictated the care and orders written for this patient under my direction.  Alyson Reedy, M.D. [facility] Pulmonary/Critical Care Medicine. Pager: [phone]. After hours pager: [phone].  10/29/2014, 9:04 AM

## 2014-10-30 LAB — BASIC METABOLIC PANEL
ANION GAP: 9 (ref 5–15)
BUN: 11 mg/dL (ref 6–23)
CALCIUM: 8.8 mg/dL (ref 8.4–10.5)
CHLORIDE: 109 mmol/L (ref 96–112)
CO2: 20 mmol/L (ref 19–32)
Creatinine, Ser: 0.54 mg/dL (ref 0.50–1.10)
GFR calc non Af Amer: >90 mL/min (ref >90)
Glucose, Bld: 138 mg/dL — ABNORMAL HIGH (ref 70–99)
POTASSIUM: 3.5 mmol/L (ref 3.5–5.1)
SODIUM: 138 mmol/L (ref 135–145)

## 2014-10-30 LAB — CARBOXYHEMOGLOBIN
Carboxyhemoglobin: 1.9 % — ABNORMAL HIGH (ref 0.5–1.5)
Methemoglobin: 0.8 % (ref 0.0–1.5)
O2 Saturation: 64.7 %
TOTAL HEMOGLOBIN: 10.4 g/dL — AB (ref 12.0–16.0)

## 2014-10-30 MED ORDER — LISINOPRIL 20 MG PO TABS
20.0000 mg | ORAL_TABLET | Freq: Two times a day (BID) | ORAL | Status: DC
  Administered 2014-10-30 – 2014-10-31 (×2): 20 mg via ORAL
  Filled 2014-10-30 (×3): qty 1

## 2014-10-30 MED ORDER — CARVEDILOL 6.25 MG PO TABS
6.2500 mg | ORAL_TABLET | Freq: Two times a day (BID) | ORAL | Status: DC
  Administered 2014-10-30 – 2014-10-31 (×2): 6.25 mg via ORAL
  Filled 2014-10-30 (×4): qty 1

## 2014-10-30 MED ORDER — POTASSIUM CHLORIDE CRYS ER 20 MEQ PO TBCR
40.0000 meq | EXTENDED_RELEASE_TABLET | Freq: Once | ORAL | Status: AC
  Administered 2014-10-30: 40 meq via ORAL
  Filled 2014-10-30: qty 2

## 2014-10-30 MED ORDER — NICOTINE 14 MG/24HR TD PT24: 14.0000 mg | MEDICATED_PATCH | Freq: Every day | TRANSDERMAL | Status: DC

## 2014-10-30 MED ORDER — NICOTINE 21 MG/24HR TD PT24
21.0000 mg | MEDICATED_PATCH | Freq: Every day | TRANSDERMAL | Status: DC
  Administered 2014-10-30 – 2014-10-31 (×2): 21 mg via TRANSDERMAL
  Filled 2014-10-30 (×2): qty 1

## 2014-10-30 MED ORDER — LISINOPRIL 10 MG PO TABS
10.0000 mg | ORAL_TABLET | Freq: Once | ORAL | Status: AC
  Administered 2014-10-30: 10 mg via ORAL
  Filled 2014-10-30: qty 1

## 2014-10-30 MED ORDER — CARVEDILOL 3.125 MG PO TABS
3.1250 mg | ORAL_TABLET | Freq: Once | ORAL | Status: AC
  Administered 2014-10-30: 3.125 mg via ORAL
  Filled 2014-10-30: qty 1

## 2014-10-30 NOTE — Progress Notes (Signed)
Heart Failure Navigator Consult Note  Presentation: Lauren Sharp is a 24 y.o. year old female G1P0 who was admitted 03/21 for PIH, pre-eclampsia. She was at [redacted] weeks gestation. She was treated medically but developed pulmonary edema and a CCM consult was called. She had a C-section on 10/21/2014. She notes BP 180/120 for months during her pregnancy.   Echocardiogram showed an EF of 15% with global hypokinesis, LV & RV failure, pulmonary hypertension, results below.  She was transferred from [facility] to [facility] for HF management on 3/28. She was diuresed with IV lasix, carvedilol started as well as altace.   Past Medical History  Diagnosis Date  . Gonorrhea   . Bacterial vaginosis   . Psoriasis   . Vaginal Pap smear, abnormal   . Hypertension     History   Social History  . Marital Status: Single    Spouse Name: N/A  . Number of Children: N/A  . Years of Education: N/A   Social History Main Topics  . Smoking status: Current Some Day Smoker -- 1.00 packs/day for 10 years    Types: Cigarettes  . Smokeless tobacco: Never Used  . Alcohol Use: Yes     Comment: Rare  . Drug Use: No  . Sexual Activity: Yes    Birth Control/ Protection: None     Comment: last sex three days ago   Other Topics Concern  . None   Social History Narrative    ECHO:Study Conclusions  - Left ventricle: The cavity size was mildly dilated. Wall thickness was normal. Systolic function was severely reduced. The estimated ejection fraction was 15%. Diffuse hypokinesis. Doppler parameters are consistent with restrictive physiology, indicative of decreased left ventricular diastolic compliance and/or increased left atrial pressure. - Aortic valve: There was trivial regurgitation. - Mitral valve: There was mild regurgitation. - Left atrium: The atrium was moderately dilated. - Right ventricle: Systolic function was moderately reduced. - Pulmonary arteries: Systolic pressure was mildly increased.  PA peak pressure: 41 mm Hg (S). - Pericardium, extracardiac: A small pericardial effusion was identified. There was a left pleural effusion.  Impressions:  - Severe global reduction in LV function; restrictive filling pattern; moderate LAE; moderate RV dysfunction; mild MR; trace AI; mildly elevated pulmonary pressure; small pericardial effusion.  BNP    Component Value Date/Time   BNP 2719.1* 10/27/2014 1135    ProBNP No results found for: PROBNP   Education Assessment and Provision:  Detailed education and instructions provided on heart failure disease management including the following:  Signs and symptoms of Heart Failure When to call the physician Importance of daily weights Low sodium diet Fluid restriction Medication management Anticipated future follow-up appointments  Patient education given on each of the above topics.  Patient acknowledges understanding and acceptance of all instructions.  I spoke with Lauren Sharp regarding her newly diagnosed HF.  She seems to have poor health literacy yet a decent basic understanding of HF.  We reviewed the importance of daily weights, the signs and symptoms of HF and when she should call the physician.  She tells me that she is "afraid to go home" because of the fear of being SOB and having to call 911.  I reinforced a low sodium diet and high sodium foods to avoid.  I reviewed a nutrition label and how serving size impacts the nutrients listed-- at her request.  She asked many appropriate questions.  She is concerned and asked questions regarding returning to work, "taking medications for the rest  of her life", "drinking a glass of wine" and "being back to normal".  I answered each to the best of my ability and explained that a lot will depend on how she recovers after discharge from the hospital.  She was very appreciative--I will plan to return tomorrow to reinforce education as our conversation was cut short by a phone  call.  Education Materials:  "Living Better With Heart Failure" Booklet, Daily Weight Tracker Tool   High Risk Criteria for Readmission and/or Poor Patient Outcomes:  (Recommend Follow-up with Advanced Heart Failure Clinic)--yes patient will follow up with AHF Clinic   EF <30%- Yes 15%  2 or more admissions in 6 months- No-New HF  Difficult social situation- No  Demonstrates medication noncompliance- No-was not on prior medications    Barriers of Care:  Health Literacy, Knowledge of HF and compliance  Discharge Planning:   Plans to discharge to home with boyfriend --he works all day and is not home until evenings

## 2014-10-30 NOTE — Progress Notes (Signed)
Advanced Heart Failure Rounding Note   Subjective:    Lauren Sharp is a 24 y.o. year old female G1P0 who was admitted 03/21 for PIH, pre-eclampEdythe Sharp. She was at [redacted] weeks gestation. She was treated medically but developed pulmonary edema and a CCM consult was called. She had a C-section on 10/21/2014. She notes BP 180/120 for months during her pregnancy.   Echocardiogram showed an EF of 15% with global hypokinesis, LV & RV failure, pulmonary hypertension, results below.  [facility] on [facility]. She was diuresed with IV lasix, carvedilol started as well as altace.   UOP only 450 yesterday. Discussed with nurse and patient urinating with no issues. Weight up 1 lb from yesterday (17 pounds down since admission). Breathing much better. Had some dyspnea while showering but able to walk around yesterday. BP elevated from 140-170s systolic.   CVP 3-4, Co-ox 65%.  She states her abdominal and right flank pain are improving. She denies dysuria. Ucx still pending. On ceftriaxone.   Objective:   Weight Range:  Vital Signs:   Temp:  [98.1 F (36.7 C)-98.9 F (37.2 C)] 98.1 F (36.7 C) (03/31 0816) Pulse Rate:  [95-107] 98 (03/30 2309) Resp:  [17] 17 (03/31 0816) BP: (112-174)/(76-96) 146/90 mmHg (03/31 0816) SpO2:  [96 %-98 %] 97 % (03/31 0816) Weight:  [203 lb 0.7 oz (92.1 kg)] 203 lb 0.7 oz (92.1 kg) (03/31 0300) Last BM Date: 10/23/14  Weight change: Filed Weights   10/28/14 1345 10/29/14 0335 10/30/14 0300  Weight: 204 lb 12.9 oz (92.9 kg) 202 lb 9.6 oz (91.9 kg) 203 lb 0.7 oz (92.1 kg)    Intake/Output:   Intake/Output Summary (Last 24 hours) at 10/30/14 0837 Last data filed at 10/30/14 0019  Gross per 24 hour  Intake    600 ml  Output    450 ml  Net    150 ml     Physical Exam: General:  Sitting up in bed, NAD HEENT: [place]/AT, EOMI, sclera anicteric, mucus membranes moist Neck: supple. No JVD. Carotids 2+ bilat; no bruits. No  lymphadenopathy or thryomegaly appreciated. Cor: PMI nondisplaced. Tachycardic in 100s. Regular  rate & rhythm. No rubs, or murmurs. + S3  Lungs: clear  Abdomen: BS+, soft, nontender, nondistended. No hepatosplenomegaly. No bruits or masses. Horizontal surgical incision with staples present in lower abdomen, appears clean with no erythema or drainage. Extremities: no cyanosis, clubbing, rash, edema. Multiple tattoos Neuro: alert & orientedx3, cranial nerves grossly intact. moves all 4 extremities w/o difficulty. Affect pleasant  Telemetry: Sinus Tach 100-105  Labs: Basic Metabolic Panel:  Recent Labs Lab 10/24/14 1000 10/26/14 1119 10/27/14 0927 10/28/14 0859 10/29/14 0409 10/30/14 0410  NA 139 138  --  137 137 138  K 3.0* 4.1  --  4.3 4.0 3.5  CL 104 108  --  106 102 109  CO2 27 21  --  23 25 20   GLUCOSE 98 124*  --  108* 148* 138*  BUN 9 22  --  16 13 11   CREATININE 0.51 0.60  --  0.76 0.69 0.54  CALCIUM 9.6 9.5  --  10.2 10.1 8.8  MG  --   --  1.7  --   --   --   PHOS  --   --  4.1  --   --   --     Liver Function Tests:  Recent Labs Lab 10/24/14 1000 10/26/14 1119  AST 27 21  ALT 17 15  ALKPHOS 87 86  BILITOT 0.2* 0.2*  PROT 5.5* 5.9*  ALBUMIN 2.7* 2.9*   CBC:  Recent Labs Lab 10/24/14 1000 10/28/14 0859  WBC 10.2 16.0*  HGB 10.7* 10.8*  HCT 31.3* 31.8*  MCV 86.0 85.9  PLT 159 217   BNP: BNP (last 3 results)  Recent Labs  10/27/14 1135  BNP 2719.1*    Other results: ECG; Sinus tach 114.  Narrow QRS   Imaging: No results found.   Medications:     Scheduled Medications: . carvedilol  3.125 mg Oral BID WC  . cefTRIAXone (ROCEPHIN)  IV  1 g Intravenous Q24H  . citalopram  10 mg Oral Daily  . digoxin  0.25 mg Oral Daily  . furosemide  20 mg Oral Daily  . ipratropium-albuterol  3 mL Nebulization Q6H  . lisinopril  10 mg Oral BID  . magnesium oxide  400 mg Oral BID  . potassium chloride  40 mEq Oral Once  . prenatal multivitamin  1  tablet Oral Q1200  . sodium chloride  10-40 mL Intracatheter Q12H  . sodium chloride  3 mL Intravenous Q12H  . spironolactone  25 mg Oral Daily    Infusions:    PRN Medications: sodium chloride, acetaminophen, witch hazel-glycerin **AND** dibucaine, lanolin, LORazepam, ondansetron (ZOFRAN) IV, senna-docusate, simethicone, sodium chloride, sodium chloride, traMADol, zolpidem   Assessment:   1. Preeclampsia complicating hypertension 2.  H/O cesarean section 3.  Acute respiratory failure with hypoxia 4.  Acute systolic CHF (congestive heart failure) ECHO 10/27/2014  EF 15%  5.  Peripartum cardiomyopathy, postpartum 6.  Hilar adenopathy 7. UTI  Plan/Discussion:     Transferred from [facility] to [facility] for HF management. New onset acute systolic heart failure - peripartum cardiomyopathy.   Tachycardia improving. UOP adequate, kidney function stable at baseline 0.7. Lasix decreased to 20 mg PO daily.   BP still elevated in 140-170s systolic. Increase Coreg to 6.25 mg BID and Lisinopril to 20 mg BID. Continue Digoxin 0.25 mg and Spironolactone 25 mg daily.   Treating UTI with Ceftriaxone. Awaiting urine culture sensitivities and then can switch to oral therapy.   Extensively discussed smoking cessation with patient and how smoking affects not only her and her cardiomyopathy but her baby's health too. She wants to quit smoking and would like to try a nicotine patch.   Likely home tomorrow.    Length of Stay: 10 Advanced Heart Failure Team Pager [phone] (M-F; 7a - 4p)  Please contact [facility] for night-coverage after hours (4p -7a ) and weekends on [email or web address]  Rivet, Carly,MD 8:37 AM    Patient seen and examined with Dr. Beckie Salts. We discussed all aspects of the encounter. I agree with the assessment and plan as stated above.   She continues to improve. Agree with med changes as above. Continue to ambulate. Await ucx. Hopefully home tomorrow. We reached at to [facility] service  to remove stables and address post c-section pain meds.   Johnel Yielding,MD 9:19 AM

## 2014-10-30 NOTE — Progress Notes (Signed)
patietn taken to pediatric unit to bond with infant son at [id number], medicated with tramadol prior to going upstairs, spoke with Efraim Kaufmannmelissa RN for her son and she is to call me if any problems occur.  Also spoke with Efraim Kaufmannmelissa and she is going to help with educating patient on post c-section care while there with son.  Will continue to monitor. Patient on remote tele monitor

## 2014-10-30 NOTE — Progress Notes (Signed)
Patient called and asked to come back to unit from pediatrics, she is in room no signs of distress, will continue to monitor

## 2014-10-31 DIAGNOSIS — N390 Urinary tract infection, site not specified: Secondary | ICD-10-CM | POA: Diagnosis not present

## 2014-10-31 LAB — BASIC METABOLIC PANEL
Anion gap: 7 (ref 5–15)
BUN: 14 mg/dL (ref 6–23)
CHLORIDE: 110 mmol/L (ref 96–112)
CO2: 21 mmol/L (ref 19–32)
Calcium: 8.6 mg/dL (ref 8.4–10.5)
Creatinine, Ser: 0.45 mg/dL — ABNORMAL LOW (ref 0.50–1.10)
GFR calc non Af Amer: >90 mL/min (ref >90)
GLUCOSE: 87 mg/dL (ref 70–99)
POTASSIUM: 3.5 mmol/L (ref 3.5–5.1)
Sodium: 138 mmol/L (ref 135–145)

## 2014-10-31 LAB — URINE CULTURE: Colony Count: >=100000

## 2014-10-31 LAB — CARBOXYHEMOGLOBIN
Carboxyhemoglobin: 1.7 % — ABNORMAL HIGH (ref 0.5–1.5)
Methemoglobin: 0.7 % (ref 0.0–1.5)
O2 Saturation: 99.2 %
Total hemoglobin: 9.6 g/dL — ABNORMAL LOW (ref 12.0–16.0)

## 2014-10-31 MED ORDER — SPIRONOLACTONE 25 MG PO TABS: 25.0000 mg | ORAL_TABLET | Freq: Every day | ORAL | Status: DC

## 2014-10-31 MED ORDER — CITALOPRAM HYDROBROMIDE 10 MG PO TABS: 10.0000 mg | ORAL_TABLET | Freq: Every day | ORAL | Status: DC

## 2014-10-31 MED ORDER — CARVEDILOL 6.25 MG PO TABS: 6.2500 mg | ORAL_TABLET | Freq: Two times a day (BID) | ORAL | Status: DC

## 2014-10-31 MED ORDER — CITALOPRAM HYDROBROMIDE 20 MG PO TABS
20.0000 mg | ORAL_TABLET | Freq: Every day | ORAL | Status: DC
  Administered 2014-10-31: 20 mg via ORAL
  Filled 2014-10-31: qty 1

## 2014-10-31 MED ORDER — NICOTINE 14 MG/24HR TD PT24: 14.0000 mg | MEDICATED_PATCH | Freq: Every day | TRANSDERMAL | Status: DC

## 2014-10-31 MED ORDER — LISINOPRIL 20 MG PO TABS: 20.0000 mg | ORAL_TABLET | Freq: Two times a day (BID) | ORAL | Status: DC

## 2014-10-31 MED ORDER — FUROSEMIDE 20 MG PO TABS: 20.0000 mg | ORAL_TABLET | Freq: Every day | ORAL | Status: DC

## 2014-10-31 MED ORDER — DIGOXIN 250 MCG PO TABS: 0.2500 mg | ORAL_TABLET | Freq: Every day | ORAL | Status: DC

## 2014-10-31 MED ORDER — ACETAMINOPHEN 325 MG PO TABS: 650.0000 mg | ORAL_TABLET | ORAL | Status: DC | PRN

## 2014-10-31 NOTE — Progress Notes (Signed)
Removed 19 staples from abdominal surgical incision with Dr. Gala RomneyBensimhon.  Edges are closed with no drainage, site clean and dry. Patient tolerated well

## 2014-10-31 NOTE — Progress Notes (Signed)
Advanced Heart Failure Rounding Note   Subjective:    Lauren Sharp is a 24 y.o. year old female G1P0 who was admitted 03/21 for PIH, pre-eclampsia. She was at [redacted] weeks gestation. She was treated medically but developed pulmonary edema and a CCM consult was called. She had a C-section on 10/21/2014. She notes BP 180/120 for months during her pregnancy.   Echocardiogram showed an EF of 15% with global hypokinesis, LV & RV failure, pulmonary hypertension, results below.  She was transferred from [facility] to [facility] for HF management on 3/28. She was diuresed with IV lasix, carvedilol started as well as altace.   Continues to improve. Breathing well. Lying flat. Weight down another 3 pounds. HR now in 80s  Objective:   Weight Range:  Vital Signs:   Temp:  [97.8 F (36.6 C)-98.7 F (37.1 C)] 97.8 F (36.6 C) (04/01 0804) Pulse Rate:  [96] 96 (03/31 0926) Resp:  [16-18] 18 (04/01 0804) BP: (96-142)/(57-101) 103/71 mmHg (04/01 0804) SpO2:  [95 %-97 %] 96 % (04/01 0804) Weight:  [90.901 kg (200 lb 6.4 oz)] 90.901 kg (200 lb 6.4 oz) (04/01 0327) Last BM Date: 10/23/14  Weight change: Filed Weights   10/29/14 0335 10/30/14 0300 10/31/14 0327  Weight: 91.9 kg (202 lb 9.6 oz) 92.1 kg (203 lb 0.7 oz) 90.901 kg (200 lb 6.4 oz)    Intake/Output:  No intake or output data in the 24 hours ending 10/31/14 0923   Physical Exam: General:  Sitting up in bed, NAD HEENT: [place]/AT, EOMI, sclera anicteric, mucus membranes moist Neck: supple. No JVD. Carotids 2+ bilat; no bruits. No lymphadenopathy or thryomegaly appreciated. Cor: PMI nondisplaced. Regular 80s Regular  rate & rhythm. No rubs, or murmurs. + S3  Lungs: clear  Abdomen: BS+, soft, nontender, nondistended. No hepatosplenomegaly. No bruits or masses. Horizontal surgical incision with staples present in lower abdomen, appears clean with no erythema or drainage. Extremities: no cyanosis, clubbing, rash, edema. Multiple tattoos Neuro: alert &  orientedx3, cranial nerves grossly intact. moves all 4 extremities w/o difficulty. Affect pleasant  Telemetry: Sinus 85   Labs: Basic Metabolic Panel:  Recent Labs Lab 10/26/14 1119 10/27/14 0927 10/28/14 0859 10/29/14 0409 10/30/14 0410 10/31/14 0316  NA 138  --  137 137 138 138  K 4.1  --  4.3 4.0 3.5 3.5  CL 108  --  106 102 109 110  CO2 21  --  GLUCOSE 124*  --  108* 148* 138* 87  BUN 22  --  CREATININE 0.60  --  0.76 0.69 0.54 0.45*  CALCIUM 9.5  --  10.2 10.1 8.8 8.6  MG  --  1.7  --   --   --   --   PHOS  --  4.1  --   --   --   --     Liver Function Tests:  Recent Labs Lab 10/24/14 1000 10/26/14 1119  AST 27 21  ALT 17 15  ALKPHOS 87 86  BILITOT 0.2* 0.2*  PROT 5.5* 5.9*  ALBUMIN 2.7* 2.9*   CBC:  Recent Labs Lab 10/24/14 1000 10/28/14 0859  WBC 10.2 16.0*  HGB 10.7* 10.8*  HCT 31.3* 31.8*  MCV 86.0 85.9  PLT 159 217   BNP: BNP (last 3 results)  Recent Labs  10/27/14 1135  BNP 2719.1*    Other results:    Imaging: No results found.   Medications:  Scheduled Medications: . carvedilol  6.25 mg Oral BID WC  . cefTRIAXone (ROCEPHIN)  IV  1 g Intravenous Q24H  . citalopram  10 mg Oral Daily  . digoxin  0.25 mg Oral Daily  . furosemide  20 mg Oral Daily  . ipratropium-albuterol  3 mL Nebulization Q6H  . lisinopril  20 mg Oral BID  . magnesium oxide  400 mg Oral BID  . nicotine  21 mg Transdermal Daily  . prenatal multivitamin  1 tablet Oral Q1200  . sodium chloride  10-40 mL Intracatheter Q12H  . sodium chloride  3 mL Intravenous Q12H  . spironolactone  25 mg Oral Daily    Infusions:    PRN Medications: sodium chloride, acetaminophen, witch hazel-glycerin **AND** dibucaine, lanolin, LORazepam, ondansetron (ZOFRAN) IV, senna-docusate, simethicone, sodium chloride, sodium chloride, traMADol, zolpidem   Assessment:   1. Preeclampsia complicating hypertension 2.  H/O cesarean section 3.   Acute respiratory failure with hypoxia 4.  Acute systolic CHF (congestive heart failure) ECHO 10/27/2014  EF 15%  5.  Peripartum cardiomyopathy, postpartum 6.  Hilar adenopathy 7. UTI - e. coli  Plan/Discussion:     [facility]. New onset acute systolic heart failure - peripartum cardiomyopathy.   Much improved. I removed C-section staples.  Can d/c home today on below meds  Lasix 20 mg daily Spiro 25 daily Lisinopril 20 bid Carvedilol 6.25 bid Digoxin 0.25 daily Celexa 20 mg daily Keflex 500 tid x 4 days Nicotine patch  Hold lasix if weight 197 or less.  F/u HF Clinic on 4/8 at 930a. Check BMET at digoxin level   Length of Stay: 11 Advanced Heart Failure Team Pager [phone] (M-F; 7a - 4p)  Please contact [facility] for night-coverage after hours (4p -7a ) and weekends on [email or web address]  Daniel Bensimhon,MD 9:23 AM

## 2014-10-31 NOTE — Progress Notes (Signed)
I stopped back in to reinforce HF education with Lauren Sharp and Lauren Sharp.  She says that she does not have any specific questions regarding Lauren HF at this time.  She is concerned about going home and becoming SOB as was previously discussed yesterday.  She asked for "something for anxiety" to take at home.  I have asked Lauren to follow-up with a PCP regarding taking something for anxiety after discharge from the hospital.  She still plans to go home with Lauren boyfriend who is gone most of the day to work--Lauren Sharp says that she can come stay Lauren--yet patient refused.   I asked Lauren to inform the clinic with weight gains or any increase in SOB after she goes home.  She acknowledges understanding.  I also encouraged Lauren to call me with questions or concerns regarding Lauren HF after she is discharged.

## 2014-10-31 NOTE — Progress Notes (Signed)
Patient taken to pediatric unit @ 0900 to visit with her newborn son via wheelchair after dr benshimon in to see her.  She has no complaints of pain this morning and was transferred without difficulty.  She requested to come back to unit at 1100 and came back via wheelchair with her mother and tech.  She is currently in room awaiting discharge. Consult for IV team to remove PICC line in place, [facility] to come back in and talk to her and mother.  Will continue to monitor

## 2014-10-31 NOTE — Discharge Summary (Signed)
Patient ID: Lauren Sharp,  MRN: [id number], DOB/AGE: 02/23/1991 23 y.o.  Admit date: 10/19/2014 Discharge date: 10/31/2014  Primary Care Provider: No primary care provider on file. Primary Cardiologist: Dr Gala Romney  Discharge Diagnoses Principal Problem:   Acute respiratory failure with hypoxia Active Problems:   Acute systolic CHF (congestive heart failure)   Peripartum cardiomyopathy, postpartum   Preeclampsia complicating hypertension   H/O cesarean section   UTI (urinary tract infection)   Smoking    Procedures: Echo 10/27/14   Hospital Course:  24 y.o. year old female G1P0 who was admitted 03/21 for PIH, pre-eclampsia. She was at [redacted] weeks gestation. She was treated medically but developed pulmonary edema and a CCM consult was called. She had a C-section on 10/21/2014. She notes BP 180/120 for months during her pregnancy. Echocardiogram done 10/27/14 showed an EF of 15% with global hypokinesis, LV & RV failure, and pulmonary hypertension. She was transferred from [facility] to [facility] for HF management on 3/28. She was diuresed with IV lasix, and medications were adjusted. Carvedilol was started as well as Altace, lanoxin, po Lasix, and aldactone. She complained of flank pain and a urine culture was obtained that showed > 100000 E.Coli resistant to [facility] and Septra. She was treated with 3 days of Rocephin. Dr Gala Romney feels she is stable for discharge 10/31/14. She has been counseled on smoking cessation and seen by the CHF RN prior to discharge. She'll be seen in the CHF clinic (or by an APP) in 1-2 weeks. She should be a TOC patient and will require a phone call in 48 hrs.   Discharge Vitals:  Blood pressure 103/71, pulse 96, temperature 97.8 F (36.6 C), temperature source Oral, resp. rate 18, height  (1.626 m), weight 200 lb 6.4 oz (90.901 kg), last menstrual period 03/04/2014, SpO2 96 %, not currently breastfeeding.    Labs: Results for orders placed or performed during the hospital  encounter of 10/19/14 (from the past 24 hour(s))  Basic metabolic panel     Status: Abnormal   Collection Time: 10/31/14  3:16 AM  Result Value Ref Range   Sodium 138 135 - 145 mmol/L   Potassium 3.5 3.5 - 5.1 mmol/L   Chloride 110 96 - 112 mmol/L   CO2 21 19 - 32 mmol/L   Glucose, Bld 87 70 - 99 mg/dL   BUN 14 6 - 23 mg/dL   Creatinine, Ser 4.09 (L) 0.50 - 1.10 mg/dL   Calcium 8.6 8.4 - 81.1 mg/dL   GFR calc non Af Amer >90 >90 mL/min   GFR calc Af Amer >90 >90 mL/min   Anion gap 7 5 - 15  Carboxyhemoglobin     Status: Abnormal   Collection Time: 10/31/14  3:30 AM  Result Value Ref Range   Total hemoglobin 9.6 (L) 12.0 - 16.0 g/dL   O2 Saturation 91.4 %   Carboxyhemoglobin 1.7 (H) 0.5 - 1.5 %   Methemoglobin 0.7 0.0 - 1.5 %    Disposition:      Follow-up Information    Follow up with [facility].   Why:  office will call you   Contact information:   [address] [place] [address]       Discharge Medications:    Medication List    STOP taking these medications        labetalol 200 MG tablet  Commonly known as:  NORMODYNE     potassium chloride 10  MEQ tablet  Commonly known as:  K-DUR,KLOR-CON      TAKE these medications        acetaminophen 325 MG tablet  Commonly known as:  TYLENOL  Take 2 tablets (650 mg total) by mouth every 4 (four) hours as needed (for pain scale < 4).     calcium carbonate 500 MG chewable tablet  Commonly known as:  TUMS - dosed in mg elemental calcium  Chew 1 tablet by mouth as needed for heartburn.     carvedilol 6.25 MG tablet  Commonly known as:  COREG  Take 1 tablet (6.25 mg total) by mouth 2 (two) times daily with a meal.     citalopram 10 MG tablet  Commonly known as:  CELEXA  Take 1 tablet (10 mg total) by mouth daily.     digoxin 0.25 MG tablet  Commonly known as:  LANOXIN  Take 1 tablet (0.25 mg total) by mouth daily.     furosemide 20 MG tablet  Commonly known as:   LASIX  Take 1 tablet (20 mg total) by mouth daily.     lisinopril 20 MG tablet  Commonly known as:  PRINIVIL,ZESTRIL  Take 1 tablet (20 mg total) by mouth 2 (two) times daily.     nicotine 14 mg/24hr patch  Commonly known as:  NICODERM CQ  Place 1 patch (14 mg total) onto the skin daily.     prenatal multivitamin Tabs tablet  Take 1 tablet by mouth daily at 12 noon.     spironolactone 25 MG tablet  Commonly known as:  ALDACTONE  Take 1 tablet (25 mg total) by mouth daily.         Duration of Discharge Encounter: Greater than 30 minutes including physician time.  Jolene ProvostSigned, Javanna Patin PA-C 10/31/2014 9:36 AM

## 2014-11-03 ENCOUNTER — Telehealth (HOSPITAL_COMMUNITY): Payer: Self-pay | Admitting: Surgery

## 2014-11-03 NOTE — Telephone Encounter (Signed)
HF Nurse Navigator Post- Discharge Telephone Call  I called Lauren Sharp to check on her after her recent hospitalization.  She reports that she is feeling much better and says she has not had any SOB.  She does admit that she has not yet weighed as she does not have a scale at home yet says that " I don't have any fluid on my legs".  I told her we will attempt to provide her one when she comes for follow-up on 11/07/14.  She tells me that she has been taking all medications as prescribed.  I encouraged her to call back with any questions related to her HF.

## 2014-11-06 ENCOUNTER — Encounter (HOSPITAL_COMMUNITY): Payer: Self-pay | Admitting: Emergency Medicine

## 2014-11-06 ENCOUNTER — Emergency Department (HOSPITAL_COMMUNITY)
Admission: EM | Admit: 2014-11-06 | Discharge: 2014-11-06 | Discharge status: Against Medical Advice | Payer: Medicaid Other | Attending: Emergency Medicine | Admitting: Emergency Medicine

## 2014-11-06 DIAGNOSIS — I10 Essential (primary) hypertension: Secondary | ICD-10-CM | POA: Diagnosis not present

## 2014-11-06 DIAGNOSIS — R079 Chest pain, unspecified: Secondary | ICD-10-CM | POA: Insufficient documentation

## 2014-11-06 LAB — CBC WITH DIFFERENTIAL/PLATELET
BASOS ABS: 0 10*3/uL (ref 0.0–0.1)
Basophils Relative: 0 % (ref 0–1)
EOS PCT: 3 % (ref 0–5)
Eosinophils Absolute: 0.3 10*3/uL (ref 0.0–0.7)
HCT: 35.3 % — ABNORMAL LOW (ref 36.0–46.0)
Hemoglobin: 12 g/dL (ref 12.0–15.0)
LYMPHS ABS: 2.9 10*3/uL (ref 0.7–4.0)
Lymphocytes Relative: 29 % (ref 12–46)
MCH: 28.8 pg (ref 26.0–34.0)
MCHC: 34 g/dL (ref 30.0–36.0)
MCV: 84.9 fL (ref 78.0–100.0)
Monocytes Absolute: 0.5 10*3/uL (ref 0.1–1.0)
Monocytes Relative: 5 % (ref 3–12)
Neutro Abs: 6.3 10*3/uL (ref 1.7–7.7)
Neutrophils Relative %: 63 % (ref 43–77)
Platelets: 336 10*3/uL (ref 150–400)
RBC: 4.16 MIL/uL (ref 3.87–5.11)
RDW: 14.6 % (ref 11.5–15.5)
WBC: 10.1 10*3/uL (ref 4.0–10.5)

## 2014-11-06 LAB — I-STAT CHEM 8, ED
BUN: 21 mg/dL (ref 6–23)
CALCIUM ION: 1.43 mmol/L — AB (ref 1.12–1.23)
CHLORIDE: 101 mmol/L (ref 96–112)
CREATININE: 0.8 mg/dL (ref 0.50–1.10)
GLUCOSE: 102 mg/dL — AB (ref 70–99)
HCT: 36 % (ref 36.0–46.0)
Hemoglobin: 12.2 g/dL (ref 12.0–15.0)
POTASSIUM: 3.9 mmol/L (ref 3.5–5.1)
Sodium: 137 mmol/L (ref 135–145)
TCO2: 21 mmol/L (ref 0–100)

## 2014-11-06 LAB — I-STAT TROPONIN, ED: TROPONIN I, POC: 0.01 ng/mL (ref 0.00–0.08)

## 2014-11-06 NOTE — ED Notes (Signed)
Pt c/o mid chest pain onset approx 1 hour ago.  Pt denies any shortness of breath, nausea or  Vomiting.

## 2014-11-06 NOTE — ED Notes (Signed)
Called at triage for blood draw and room 2x. No answer

## 2014-11-07 ENCOUNTER — Encounter (HOSPITAL_COMMUNITY): Payer: Self-pay

## 2014-11-07 ENCOUNTER — Ambulatory Visit (HOSPITAL_COMMUNITY)
Admission: RE | Admit: 2014-11-07 | Discharge: 2014-11-07 | Disposition: A | Discharge status: Home / Self Care | Payer: Medicaid Other | Source: Ambulatory Visit | Attending: Cardiology | Admitting: Cardiology

## 2014-11-07 ENCOUNTER — Other Ambulatory Visit (HOSPITAL_COMMUNITY): Payer: Self-pay | Admitting: Cardiology

## 2014-11-07 VITALS — BP 104/66 | HR 73 | Wt 190.1 lb

## 2014-11-07 DIAGNOSIS — Z79899 Other long term (current) drug therapy: Secondary | ICD-10-CM | POA: Diagnosis not present

## 2014-11-07 DIAGNOSIS — J90 Pleural effusion, not elsewhere classified: Secondary | ICD-10-CM

## 2014-11-07 DIAGNOSIS — I5021 Acute systolic (congestive) heart failure: Secondary | ICD-10-CM | POA: Diagnosis not present

## 2014-11-07 DIAGNOSIS — R079 Chest pain, unspecified: Secondary | ICD-10-CM | POA: Insufficient documentation

## 2014-11-07 DIAGNOSIS — Z87891 Personal history of nicotine dependence: Secondary | ICD-10-CM | POA: Insufficient documentation

## 2014-11-07 DIAGNOSIS — O903 Peripartum cardiomyopathy: Secondary | ICD-10-CM

## 2014-11-07 DIAGNOSIS — I5022 Chronic systolic (congestive) heart failure: Secondary | ICD-10-CM | POA: Insufficient documentation

## 2014-11-07 LAB — I-STAT BETA HCG BLOOD, ED (MC, WL, AP ONLY): I-stat hCG, quantitative: <5 m[IU]/mL (ref <5)

## 2014-11-07 LAB — D-DIMER, QUANTITATIVE (NOT AT ARMC): D DIMER QUANT: 0.54 ug{FEU}/mL — AB (ref 0.00–0.48)

## 2014-11-07 MED ORDER — LISINOPRIL 20 MG PO TABS: 10.0000 mg | ORAL_TABLET | Freq: Two times a day (BID) | ORAL | Status: DC

## 2014-11-07 MED ORDER — CARVEDILOL 6.25 MG PO TABS: 3.1250 mg | ORAL_TABLET | Freq: Two times a day (BID) | ORAL | Status: DC

## 2014-11-07 MED ORDER — SPIRONOLACTONE 25 MG PO TABS: 12.5000 mg | ORAL_TABLET | Freq: Every day | ORAL | Status: DC

## 2014-11-07 MED ORDER — IOHEXOL 350 MG/ML SOLN
100.0000 mL | Freq: Once | INTRAVENOUS | Status: AC | PRN
  Administered 2014-11-07: 100 mL via INTRAVENOUS

## 2014-11-07 NOTE — Patient Instructions (Signed)
DECREASE Spironolactone to 12.5 mg daily DECREASE Coreg to 3.125 mg , one half tab twice a day DECREASE Lisinopril 10 mg, one half tab twice a day  Your physician recommends that you schedule a follow-up appointment in: 7-10 days  Do the following things EVERYDAY: 1) Weigh yourself in the morning before breakfast. Write it down and keep it in a log. 2) Take your medicines as prescribed 3) Eat low salt foods-Limit salt (sodium) to 2000 mg per day.  4) Stay as active as you can everyday 5) Limit all fluids for the day to less than 2 liters 6)

## 2014-11-09 NOTE — Progress Notes (Signed)
Patient ID: Lauren Sharp, female   DOB: 1990/09/22, 24 y.o.   MRN: [id number] 24 yo prior smoker presents for hospital followup after developed suspected peri-partum cardiomyopathy.  Patient developed pre-eclampsia with headache, proteinuria, and severe HTN in 3/16 and delivered by c-section.  Her BP had been very high throughout pregnancy.  The child is still in the NICU.  After c-section, she developed respiratory failure and required diuresis.  Echo showed mildly dilated LV with EF 15%.  The RV was moderately dysfunctional.  She was started on a cardiac regimen and discharged home.  She is not breast-feeding.    Since getting home, she feels "funny all the time."  Has spells of clamminess/diaphoresis.  Very fatigued.  She is lightheaded with standing.  She has not had significant shortness of breath except when she pulled the the trashcan to the street.  She has quit smoking.  Last night, she developed chest pain and went to the ER.  She did not wait to be seen and went home.  CP has resolved.  BP today is low, 80/52.   I reviewed the ECG from last night: NSR, anterior TWIs (new from 3/28).   Labs (4/16): K 3.5, creatinine 0.8, HCT 35.3  PMH: 1. Pre-eclampsia 2. Hilar adenopathy 3. Peri-partum cardiomyopathy: Acute respiratory failure after delivery of child in 3/16.  Echo (3/16) with EF 15%, diffuse hypokinesis with mildly dilated LV, moderate RV systolic dysfunction.    SH: Prior smoker, has now quit.  Lives with mother.  No ETOH currently.   FH: Grandparents with CAD.   ROS: All systems reviewed and negative except as per HPI.   Current Outpatient Prescriptions  Medication Sig Dispense Refill  . acetaminophen (TYLENOL) 325 MG tablet Take 2 tablets (650 mg total) by mouth every 4 (four) hours as needed (for pain scale < 4).    . calcium carbonate (TUMS - DOSED IN MG ELEMENTAL CALCIUM) 500 MG chewable tablet Chew 1 tablet by mouth as needed for heartburn.     . carvedilol (COREG) 6.25 MG  tablet Take 0.5 tablets (3.125 mg total) by mouth 2 (two) times daily with a meal. 60 tablet 11  . citalopram (CELEXA) 10 MG tablet Take 1 tablet (10 mg total) by mouth daily. 30 tablet 11  . digoxin (LANOXIN) 0.25 MG tablet Take 1 tablet (0.25 mg total) by mouth daily. 30 tablet 11  . furosemide (LASIX) 20 MG tablet Take 1 tablet (20 mg total) by mouth daily. 30 tablet 11  . lisinopril (PRINIVIL,ZESTRIL) 20 MG tablet Take 0.5 tablets (10 mg total) by mouth 2 (two) times daily. 60 tablet 11  . spironolactone (ALDACTONE) 25 MG tablet Take 0.5 tablets (12.5 mg total) by mouth daily. 15 tablet 11   No current facility-administered medications for this encounter.   BP 104/66 mmHg  Pulse 73  Wt 190 lb 1.9 oz (86.238 kg)  SpO2 99%  LMP 11/06/2014 General: NAD Neck: No JVD, no thyromegaly or thyroid nodule.  Lungs: Clear to auscultation bilaterally with normal respiratory effort. CV: Nondisplaced PMI.  Heart regular S1/S2, no S3/S4, no murmur.  No peripheral edema.  No carotid bruit.  Normal pedal pulses.  Abdomen: Soft, nontender, no hepatosplenomegaly, no distention.  Skin: Intact without lesions or rashes.  Neurologic: Alert and oriented x 3.  Psych: Normal affect. Extremities: No clubbing or cyanosis.  HEENT: Normal.   Assessment/Plan:  1. 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  EF 15% by 3/16 echo.  She is not breast-feeding, no  not limited in terms of medication use.  Hopefully, will have full recovery of LV function over time.  On exam, she is not volume overloaded.  However, BP is low and she has orthostatic symptoms.  I think that most of her current symptoms can be explained by low BP.  - Decrease Coreg to 3.125 mg bid, decrease lisinopril to 10 mg bid, decrease spironolactone to 12.5 mg daily.  - Followup 7-10 days for re-evaluation.   - We discussed the need for birth control and the significant risk for recurrent acute CHF if she were to become pregnant again.    2. Chest pain: Resolved now but did not stay for evaluation in the ER.  She has new T wave inversions on ECG.  I was concerned for the possibility of PE.  D dimer was positive today, so I sent her for CTA chest.  This showed no PE and no PNA.    Marca AnconaDalton Loria Lacina 11/09/2014

## 2014-11-10 ENCOUNTER — Telehealth (HOSPITAL_COMMUNITY): Payer: Self-pay | Admitting: Vascular Surgery

## 2014-11-10 ENCOUNTER — Encounter (HOSPITAL_COMMUNITY): Payer: Medicaid Other

## 2014-11-10 NOTE — Telephone Encounter (Signed)
Spoke w/pt, she had her medication bottles and we reviewed them all over the phone what she said she had matched out list, she will continue taking meds and keep f/u appt as sch 4/18

## 2014-11-10 NOTE — Telephone Encounter (Signed)
Pt called she needs someone to call her and go over her medications. She thinks she is taking a medication that is not on her list, and a medication on her list that she does not have a prescription for. Please advise

## 2014-11-17 ENCOUNTER — Ambulatory Visit (HOSPITAL_COMMUNITY)
Admission: RE | Admit: 2014-11-17 | Discharge: 2014-11-17 | Disposition: A | Discharge status: Home / Self Care | Payer: Medicaid Other | Source: Ambulatory Visit | Attending: Cardiology | Admitting: Cardiology

## 2014-11-17 ENCOUNTER — Encounter (HOSPITAL_COMMUNITY): Payer: Self-pay

## 2014-11-17 VITALS — BP 100/62 | HR 76 | Wt 194.2 lb

## 2014-11-17 DIAGNOSIS — O903 Peripartum cardiomyopathy: Secondary | ICD-10-CM | POA: Diagnosis not present

## 2014-11-17 DIAGNOSIS — Z720 Tobacco use: Secondary | ICD-10-CM

## 2014-11-17 DIAGNOSIS — F32A Depression, unspecified: Secondary | ICD-10-CM

## 2014-11-17 DIAGNOSIS — F329 Major depressive disorder, single episode, unspecified: Secondary | ICD-10-CM | POA: Insufficient documentation

## 2014-11-17 DIAGNOSIS — I5022 Chronic systolic (congestive) heart failure: Secondary | ICD-10-CM | POA: Diagnosis not present

## 2014-11-17 DIAGNOSIS — Z79899 Other long term (current) drug therapy: Secondary | ICD-10-CM | POA: Diagnosis not present

## 2014-11-17 DIAGNOSIS — F17200 Nicotine dependence, unspecified, uncomplicated: Secondary | ICD-10-CM

## 2014-11-17 DIAGNOSIS — F17210 Nicotine dependence, cigarettes, uncomplicated: Secondary | ICD-10-CM | POA: Diagnosis not present

## 2014-11-17 MED ORDER — FUROSEMIDE 20 MG PO TABS: 10.0000 mg | ORAL_TABLET | Freq: Every day | ORAL | Status: DC

## 2014-11-17 NOTE — Patient Instructions (Addendum)
Your physician recommends that you schedule a follow-up appointment in: 3 weeks  

## 2014-11-17 NOTE — Progress Notes (Signed)
Patient ID: Lauren Sharp, female   DOB: 1991-01-02, 24 y.o.   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  Patient developed pre-eclampsia with headache, proteinuria, and severe HTN in 3/16 and delivered by c-section.  Her BP had been very high throughout pregnancy.  After c-section, she developed respiratory failure and required diuresis.  Echo showed mildly dilated LV with EF 15%.  The RV was moderately dysfunctional.  She was started on a cardiac regimen and discharged home.  She is not breast-feeding.    She returns for follow for HF. Last visit she was hypotensive and HF meds cut back.--> Coreg to 3.125 mg bid, lisinopril to 10 mg bid, spironolactone to 12.5 mg daily. Complains of fatigue. Baby now at home.  Denies SOB/PND/Orthopnea/CP. Dizziness improved.  Taking all medications. Tries to follow low salt diet. Smoking 2-3 cigarettes.   Labs (4/16): K 3.5, creatinine 0.8, HCT 35.3  PMH: 1. Pre-eclampsia 2. Hilar adenopathy 3. Peri-partum cardiomyopathy: Acute respiratory failure after delivery of child in 3/16.  Echo (3/16) with EF 15%, diffuse hypokinesis with mildly dilated LV, moderate RV systolic dysfunction.    SH: Prior smoker, has now quit.  Lives with mother.  No ETOH currently.   FH: Grandparents with CAD.   ROS: All systems reviewed and negative except as per HPI.   Current Outpatient Prescriptions  Medication Sig Dispense Refill  . acetaminophen (TYLENOL) 325 MG tablet Take 2 tablets (650 mg total) by mouth every 4 (four) hours as needed (for pain scale < 4).    . carvedilol (COREG) 6.25 MG tablet Take 0.5 tablets (3.125 mg total) by mouth 2 (two) times daily with a meal. 60 tablet 11  . citalopram (CELEXA) 10 MG tablet Take 1 tablet (10 mg total) by mouth daily. 30 tablet 11  . digoxin (LANOXIN) 0.25 MG tablet Take 1 tablet (0.25 mg total) by mouth daily. 30 tablet 11  . furosemide (LASIX) 20 MG tablet Take 1 tablet (20 mg  total) by mouth daily. (Patient taking differently: Take 10 mg by mouth daily. ) 30 tablet 11  . lisinopril (PRINIVIL,ZESTRIL) 20 MG tablet Take 0.5 tablets (10 mg total) by mouth 2 (two) times daily. 60 tablet 11  . spironolactone (ALDACTONE) 25 MG tablet Take 0.5 tablets (12.5 mg total) by mouth daily. 15 tablet 11  . calcium carbonate (TUMS - DOSED IN MG ELEMENTAL CALCIUM) 500 MG chewable tablet Chew 1 tablet by mouth as needed for heartburn.      No current facility-administered medications for this encounter.   BP 100/62 mmHg  Pulse 76  Wt 194 lb 4 oz (88.111 kg)  SpO2 99%  LMP 11/06/2014 General: NAD Neck: No JVD, no thyromegaly or thyroid nodule.  Lungs: Clear to auscultation bilaterally with normal respiratory effort. CV: Nondisplaced PMI.  Heart regular S1/S2, no S3/S4, no murmur.  No peripheral edema.  No carotid bruit.  Normal pedal pulses.  Abdomen: obese, Soft, nontender, no hepatosplenomegaly, no distention.  Skin: Intact without lesions or rashes.  Neurologic: Alert and oriented x 3.  Psych: Normal affect. Extremities: No clubbing or cyanosis.  HEENT: Normal.   Assessment/Plan:  1. 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  EF 15% by 3/16 echo.  She is not breast-feeding.  -Continue Coreg to 3.125 mg bid will not increase with fatigue.  -Continue lisinopril to 10 mg bid,  - Volume status stable. Continue spironolactone to 12.5 mg daily. Continue lasix 20 mg dialy  -  Has follow up with obgyn tomorrow. Will need to prevent pregnanancy We discussed the need for birth control and the significant risk for recurrent acute CHF if she were to become pregnant again.   2. Chest pain: Last visit she had chest discomfort with new T wave inversions on ECG.  Concern for the possibility of PE.  D dimer was positive and she was sent for  CTA chest.  This showed no PE and no PNA.  This has resolved.  3. Depression: Continue celexa 10 mg daily. May need to increase dose.   Refer to HF SW for coping strategies. 4. Current Smoker: Discussed smoking cessation and strongly advised to stop. She declines smoking cessation strategies.    Follow up in 3 weeks.   Ludean Duhart NP-C 11/17/2014

## 2014-11-19 ENCOUNTER — Telehealth: Payer: Self-pay | Admitting: Licensed Clinical Social Worker

## 2014-11-19 NOTE — Telephone Encounter (Signed)
CSW referred to assist with coping/support for patient who was recently diagnosed with heart failure post c-section. Patient reports her baby boy Jean RosenthalJackson is doing well and sleeping a lot which allows her to get some sleep. She resides with her boyfriend who she states is supportive and helpful. She currently has medicaid but may lose because she is post pregnancy. She plans to follow up with Social Services to inquire about continues benefits. 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 Her mother lives down the street and she reports is supportive. Patient reports she is managing and "got to do it". CSW discussed some coping mechanisms and encouraged her to get rest when possible with a newborn. Patient verbalizes understanding and will return call to CSW for further assistance when needed. CSW continues to be available for support and assistance. Lasandra BeechJackie Donn Wilmot, LCSW [phone]

## 2014-11-24 ENCOUNTER — Ambulatory Visit: Payer: Medicaid Other | Admitting: Dietician

## 2014-12-10 ENCOUNTER — Encounter (HOSPITAL_COMMUNITY): Payer: Self-pay

## 2014-12-10 ENCOUNTER — Ambulatory Visit (HOSPITAL_COMMUNITY)
Admission: RE | Admit: 2014-12-10 | Discharge: 2014-12-10 | Disposition: A | Discharge status: Home / Self Care | Payer: Medicaid Other | Source: Ambulatory Visit | Attending: Internal Medicine | Admitting: Internal Medicine

## 2014-12-10 VITALS — BP 102/68 | HR 63 | Wt 194.2 lb

## 2014-12-10 DIAGNOSIS — Z720 Tobacco use: Secondary | ICD-10-CM

## 2014-12-10 DIAGNOSIS — F17200 Nicotine dependence, unspecified, uncomplicated: Secondary | ICD-10-CM

## 2014-12-10 DIAGNOSIS — F329 Major depressive disorder, single episode, unspecified: Secondary | ICD-10-CM | POA: Insufficient documentation

## 2014-12-10 DIAGNOSIS — O903 Peripartum cardiomyopathy: Secondary | ICD-10-CM | POA: Insufficient documentation

## 2014-12-10 DIAGNOSIS — Z79899 Other long term (current) drug therapy: Secondary | ICD-10-CM | POA: Diagnosis not present

## 2014-12-10 DIAGNOSIS — I5022 Chronic systolic (congestive) heart failure: Secondary | ICD-10-CM | POA: Diagnosis not present

## 2014-12-10 DIAGNOSIS — F17210 Nicotine dependence, cigarettes, uncomplicated: Secondary | ICD-10-CM | POA: Insufficient documentation

## 2014-12-10 MED ORDER — CITALOPRAM HYDROBROMIDE 20 MG PO TABS: 20.0000 mg | ORAL_TABLET | Freq: Every day | ORAL | Status: DC

## 2014-12-10 NOTE — Patient Instructions (Signed)
STOP Digoxin and Furosemide (Lasix).  INCREASE Celexa to 20mg  daily.  Follow up 1 month with echocardiogram.  Do the following things EVERYDAY: 1) Weigh yourself in the morning before breakfast. Write it down and keep it in a log. 2) Take your medicines as prescribed 3) Eat low salt foods-Limit salt (sodium) to 2000 mg per day.  4) Stay as active as you can everyday 5) Limit all fluids for the day to less than 2 liters

## 2014-12-10 NOTE — Addendum Note (Signed)
Encounter addended by: Chyrl CivatteMegan G Donica Derouin, RN on: 12/10/2014  3:26 PM<BR>     Documentation filed: Medications, Patient Instructions Section, Orders

## 2014-12-10 NOTE — Progress Notes (Signed)
Patient ID: Lauren Sharp, female   DOB: 07/23/1991, 24 y.o.   MRN: [id number]  OB GYN: Dr Ambrose MantleHenley  24 yo prior smoker with systolic HF due to peri-partum cardiomyopathy.  Patient developed pre-eclampsia with headache, proteinuria, and severe HTN in 3/16 and delivered by c-section.  Her BP had been very high throughout pregnancy.  After c-section, she developed respiratory failure and required diuresis.  Echo showed  LVEF 15%.  The RV was moderately dysfunctional.  She was started on a cardiac regimen and discharged home.  She is not breast-feeding.    She returns for follow for HF. Last visit fatigued and BP borderline so no change in meds. Coreg now at 3.125 mg bid, lisinopril to 10 mg bid, spironolactone to 12.5 mg daily, lasix 20 mg daily. Complains of persistent fatigue. Denies SOB/PND/Orthopnea/CP/edema. Dizziness improved.  Taking all medications. Weight stable 192-196.Tries to follow low salt diet. Smoking 1/2 ppd cigarettes. Has IUD in place. Feels depressed. Cries a lot.    Labs (4/16): K 3.5, creatinine 0.8, HCT 35.3  PMH: 1. Pre-eclampsia 2. Hilar adenopathy 3. Peri-partum cardiomyopathy: Acute respiratory failure after delivery of child in 3/16.  Echo (3/16) with EF 15%, diffuse hypokinesis with mildly dilated LV, moderate RV systolic dysfunction.    SH: Current smoker  Lives with mother.  No ETOH currently.   FH: Grandparents with CAD.   ROS: All systems reviewed and negative except as per HPI.   Current Outpatient Prescriptions  Medication Sig Dispense Refill  . acetaminophen (TYLENOL) 325 MG tablet Take 2 tablets (650 mg total) by mouth every 4 (four) hours as needed (for pain scale < 4).    . calcium carbonate (TUMS - DOSED IN MG ELEMENTAL CALCIUM) 500 MG chewable tablet Chew 1 tablet by mouth as needed for heartburn.     . carvedilol (COREG) 6.25 MG tablet Take 0.5 tablets (3.125 mg total) by mouth 2 (two) times daily with a meal. 60 tablet 11  . citalopram (CELEXA) 10 MG tablet  Take 1 tablet (10 mg total) by mouth daily. 30 tablet 11  . digoxin (LANOXIN) 0.25 MG tablet Take 1 tablet (0.25 mg total) by mouth daily. 30 tablet 11  . furosemide (LASIX) 20 MG tablet Take 0.5 tablets (10 mg total) by mouth daily. 30 tablet 11  . lisinopril (PRINIVIL,ZESTRIL) 20 MG tablet Take 0.5 tablets (10 mg total) by mouth 2 (two) times daily. 60 tablet 11  . spironolactone (ALDACTONE) 25 MG tablet Take 0.5 tablets (12.5 mg total) by mouth daily. 15 tablet 11   No current facility-administered medications for this encounter.   BP 102/68 mmHg  Pulse 63  Wt 194 lb 4 oz (88.111 kg)  SpO2 99% General: NAD Neck: No JVD, no thyromegaly or thyroid nodule.  Lungs: Clear to auscultation bilaterally with normal respiratory effort. CV: Nondisplaced PMI.  Heart regular S1/S2, no S3/S4, no murmur.  No peripheral edema.  No carotid bruit.  Normal pedal pulses.  Abdomen: obese, Soft, nontender, no hepatosplenomegaly, no distention.  Skin: Intact without lesions or rashes.  Neurologic: Alert and oriented x 3.  Psych: Normal affect. Extremities: No clubbing or cyanosis.  HEENT: Normal.   Assessment/Plan:  1. Chronic systolic CHF: Due to peri-partum cardiomyopathy.  EF 15% by 3/16 echo.  She is not breast-feeding. Bedside echo today in clinic done personally EF 50-55% normal RV - Her heart function has recovered. Suspect most of fatigue now due to depression.  - Will stop lasix and digoxin -Continue Coreg to 3.125  mg bid  -Continue lisinopril to 10 mg bid, spironolactone to 12.5 mg daily -Volume status stable.  -  Can wean other meds after 6 months to a year of therapy - If decides she wants further pregnancies will need dobutamine stress echo to assess for cardiac reserve 2. Chest pain: resolved. Last visit she had chest discomfort with new T wave inversions on ECG.  Concern for the possibility of PE.  D dimer was positive and she was sent for  CTA chest.  This showed no PE and no PNA.  This  has resolved.  3. Depression: Increase celexa to 20 mg daily.  Refer to HF SW for coping strategies. 4. Current Smoker: Discussed smoking cessation and strongly advised to stop. She declines smoking cessation strategies.    Follow up in 4 weeks with formal echo  Arvilla MeresBensimhon, Daniel MD 12/10/2014

## 2014-12-17 ENCOUNTER — Telehealth: Payer: Self-pay | Admitting: Licensed Clinical Social Worker

## 2014-12-17 NOTE — Telephone Encounter (Signed)
CSW referred to follow up with patient regarding status of her medicaid. Patient reports she anticipates losing her medicaid because it was pregnancy related. Patient plans to re apply for another medicaid program that will assist with medical and prescriptions. Patient reports she receives food stamps and WIC and is hopeful to return to work next week although it will only be part time. Patient appears very motivated to improve her health and social situation. CSW offered support and will continue to be available as needed. Lasandra BeechJackie Maddyn Lieurance, LCSW [phone]

## 2014-12-30 ENCOUNTER — Other Ambulatory Visit (HOSPITAL_COMMUNITY): Payer: Self-pay | Admitting: Cardiology

## 2014-12-30 DIAGNOSIS — O903 Peripartum cardiomyopathy: Secondary | ICD-10-CM

## 2015-01-09 ENCOUNTER — Telehealth (HOSPITAL_COMMUNITY): Payer: Self-pay | Admitting: Vascular Surgery

## 2015-01-09 NOTE — Telephone Encounter (Signed)
Left pt a message about upcoming echo/ dr appt.Marland Kitchen

## 2015-01-13 ENCOUNTER — Ambulatory Visit (HOSPITAL_COMMUNITY)
Admission: RE | Admit: 2015-01-13 | Discharge: 2015-01-13 | Disposition: A | Discharge status: Home / Self Care | Payer: Self-pay | Source: Ambulatory Visit | Attending: Internal Medicine | Admitting: Internal Medicine

## 2015-01-13 ENCOUNTER — Ambulatory Visit (HOSPITAL_BASED_OUTPATIENT_CLINIC_OR_DEPARTMENT_OTHER)
Admission: RE | Admit: 2015-01-13 | Discharge: 2015-01-13 | Disposition: A | Discharge status: Home / Self Care | Payer: Self-pay | Source: Ambulatory Visit | Attending: Internal Medicine | Admitting: Internal Medicine

## 2015-01-13 ENCOUNTER — Ambulatory Visit (HOSPITAL_COMMUNITY)
Admission: RE | Admit: 2015-01-13 | Discharge: 2015-01-13 | Disposition: A | Discharge status: Home / Self Care | Payer: Self-pay | Source: Ambulatory Visit | Attending: Cardiology | Admitting: Cardiology

## 2015-01-13 VITALS — BP 106/76 | HR 76 | Wt 198.8 lb

## 2015-01-13 DIAGNOSIS — R05 Cough: Secondary | ICD-10-CM | POA: Insufficient documentation

## 2015-01-13 DIAGNOSIS — Z79899 Other long term (current) drug therapy: Secondary | ICD-10-CM | POA: Insufficient documentation

## 2015-01-13 DIAGNOSIS — F17210 Nicotine dependence, cigarettes, uncomplicated: Secondary | ICD-10-CM | POA: Insufficient documentation

## 2015-01-13 DIAGNOSIS — F329 Major depressive disorder, single episode, unspecified: Secondary | ICD-10-CM | POA: Insufficient documentation

## 2015-01-13 DIAGNOSIS — O903 Peripartum cardiomyopathy: Secondary | ICD-10-CM | POA: Insufficient documentation

## 2015-01-13 DIAGNOSIS — I5022 Chronic systolic (congestive) heart failure: Secondary | ICD-10-CM | POA: Insufficient documentation

## 2015-01-13 DIAGNOSIS — R059 Cough, unspecified: Secondary | ICD-10-CM

## 2015-01-13 DIAGNOSIS — I5021 Acute systolic (congestive) heart failure: Secondary | ICD-10-CM

## 2015-01-13 LAB — BASIC METABOLIC PANEL
Anion gap: 8 (ref 5–15)
BUN: 11 mg/dL (ref 6–20)
CHLORIDE: 106 mmol/L (ref 101–111)
CO2: 24 mmol/L (ref 22–32)
Calcium: 9.8 mg/dL (ref 8.9–10.3)
Creatinine, Ser: 0.5 mg/dL (ref 0.44–1.00)
GFR calc Af Amer: >60 mL/min (ref >60)
GFR calc non Af Amer: >60 mL/min (ref >60)
Glucose, Bld: 89 mg/dL (ref 65–99)
Potassium: 3.8 mmol/L (ref 3.5–5.1)
SODIUM: 138 mmol/L (ref 135–145)

## 2015-01-13 LAB — BRAIN NATRIURETIC PEPTIDE: B Natriuretic Peptide: 81.8 pg/mL (ref 0.0–100.0)

## 2015-01-13 MED ORDER — LOSARTAN POTASSIUM 25 MG PO TABS: 25.0000 mg | ORAL_TABLET | Freq: Every day | ORAL | Status: DC

## 2015-01-13 NOTE — Patient Instructions (Signed)
STOP Lisinopril START Losartan 25 mg, one tab daily  Labs today and again in 3 months  A chest x-ray takes a picture of the organs and structures inside the chest, including the heart, lungs, and blood vessels. This test can show several things, including, whether the heart is enlarges; whether fluid is building up in the lungs; and whether pacemaker / defibrillator leads are still in place.  Your physician recommends that you schedule a follow-up appointment in: 6 months  Do the following things EVERYDAY: 1) Weigh yourself in the morning before breakfast. Write it down and keep it in a log. 2) Take your medicines as prescribed 3) Eat low salt foods-Limit salt (sodium) to 2000 mg per day.  4) Stay as active as you can everyday 5) Limit all fluids for the day to less than 2 liters 6)

## 2015-01-13 NOTE — Progress Notes (Signed)
Patient ID: Lauren Sharp, female   DOB: [birth year], 24 y.o.   MRN: [id number] OB GYN: Dr Ambrose Mantle  24 yo prior smoker with systolic HF due to peri-partum cardiomyopathy.  Patient developed pre-eclampsia with headache, proteinuria, and severe HTN in 3/16 and delivered by c-section.  Her BP had been very high throughout pregnancy.  After c-section, she developed respiratory failure and required diuresis.  Echo showed  LVEF 15%.  The RV was moderately dysfunctional.  She was started on a cardiac regimen and discharged home.  She is not breast-feeding.    She returns for follow for HF.  Smoking 1/2 ppd cigarettes. Has IUD in place. Now on Celexa with depression.  Has generalized fatigue but no exertional dyspnea.  No orthopnea or PND.  No chest pain, palpitations.  She is working as a Psychologist, forensic at [employer].  No problems doing her job.  She has had a dry cough persistently for over 2 weeks now.  No fever.   Labs (4/16): K 3.5, creatinine 0.8, HCT 35.3  PMH: 1. Pre-eclampsia 2. Hilar adenopathy 3. Peri-partum cardiomyopathy: Acute respiratory failure after delivery of child in 3/16.  Echo (3/16) with EF 15%, diffuse hypokinesis with mildly dilated LV, moderate RV systolic dysfunction.  Echo (6/16) with EF 50-55%, grade II diastolic dysfunction, normal RV size and systolic function.  4. Depression  SH: Current smoker  Lives with mother.  No ETOH currently.   FH: Grandparents with CAD.   ROS: All systems reviewed and negative except as per HPI.   Current Outpatient Prescriptions  Medication Sig Dispense Refill  . carvedilol (COREG) 6.25 MG tablet Take 0.5 tablets (3.125 mg total) by mouth 2 (two) times daily with a meal. 60 tablet 11  . citalopram (CELEXA) 20 MG tablet Take 1 tablet (20 mg total) by mouth daily. 90 tablet 3  . ibuprofen (ADVIL,MOTRIN) 200 MG tablet Take 200 mg by mouth every 6 (six) hours as needed.    Marland Kitchen spironolactone (ALDACTONE) 25 MG tablet Take 0.5 tablets  (12.5 mg total) by mouth daily. 15 tablet 11  . losartan (COZAAR) 25 MG tablet Take 1 tablet (25 mg total) by mouth daily. 30 tablet 3   No current facility-administered medications for this encounter.   BP 106/76 mmHg  Pulse 76  Wt 198 lb 12 oz (90.152 kg)  SpO2 99% General: NAD Neck: No JVD, no thyromegaly or thyroid nodule.  Lungs: Clear to auscultation bilaterally with normal respiratory effort. CV: Nondisplaced PMI.  Heart regular S1/S2, no S3/S4, no murmur.  No peripheral edema.  No carotid bruit.  Normal pedal pulses.  Abdomen: obese, Soft, nontender, no hepatosplenomegaly, no distention.  Skin: Intact without lesions or rashes.  Neurologic: Alert and oriented x 3.  Psych: Normal affect. Extremities: No clubbing or cyanosis.  HEENT: Normal.   Assessment/Plan:  1. Chronic systolic CHF: Due to peri-partum cardiomyopathy.  EF 15% by 3/16 echo but improved back to 50-55% on echo today (I reviewed).  She is not breast-feeding.  She has an IUD.  She is off Lasix now with no volume overload on exam.  - Continue Coreg and spironolactone.  - Stop lisinopril with chronic dry cough and start losartan 25 mg daily.  I had her do a CXR today given cough, there was no acute finding.   - With recovery of EF to >50%, she has a 20-25% risk of fall in EF and CHF symptoms with a subsequent pregnancy.  A low dose dobutamine stress echo  with lack of contractile reserve would predict worse outcome.  - BMET/BNP today, repeat BMET in 3 months.  2. Depression: Some improvement with Celexa.  3. Current Smoker: Discussed smoking cessation and strongly advised to stop.   Followup in 6 months but will get BMET in 3 months.    Marca Ancona MD 01/13/2015

## 2015-01-13 NOTE — Progress Notes (Signed)
  Echocardiogram 2D Echocardiogram has been performed.  Delcie Roch 01/13/2015, 1:32 PM

## 2015-02-28 ENCOUNTER — Encounter (HOSPITAL_COMMUNITY): Payer: Self-pay | Admitting: Emergency Medicine

## 2015-02-28 ENCOUNTER — Emergency Department (INDEPENDENT_AMBULATORY_CARE_PROVIDER_SITE_OTHER)
Admission: EM | Admit: 2015-02-28 | Discharge: 2015-02-28 | Disposition: A | Discharge status: Home / Self Care | Payer: Self-pay | Source: Home / Self Care | Attending: Family Medicine | Admitting: Family Medicine

## 2015-02-28 DIAGNOSIS — R05 Cough: Secondary | ICD-10-CM

## 2015-02-28 DIAGNOSIS — R059 Cough, unspecified: Secondary | ICD-10-CM

## 2015-02-28 DIAGNOSIS — J069 Acute upper respiratory infection, unspecified: Secondary | ICD-10-CM

## 2015-02-28 LAB — POCT RAPID STREP A: Streptococcus, Group A Screen (Direct): NEGATIVE

## 2015-02-28 MED ORDER — AMOXICILLIN 500 MG PO CAPS: 500.0000 mg | ORAL_CAPSULE | Freq: Two times a day (BID) | ORAL | Status: AC

## 2015-02-28 MED ORDER — HYDROCODONE-HOMATROPINE 5-1.5 MG/5ML PO SYRP: 5.0000 mL | ORAL_SOLUTION | Freq: Four times a day (QID) | ORAL | Status: DC | PRN

## 2015-02-28 NOTE — ED Provider Notes (Signed)
CSN: [id number]     Arrival date & time 02/28/15  1526 History   First MD Initiated Contact with Patient 02/28/15 1722     Chief Complaint  Patient presents with  . URI   (Consider location/radiation/quality/duration/timing/severity/associated sxs/prior Treatment) HPI Comments: Patient presents with a 1 week history of nasal congestion, ear pain, swollen lymph nodes and sore throat. She is also having a non-productive cough and subjective night time chills.   Patient is a 24 y.o. female presenting with URI. The history is provided by the patient.  URI   Past Medical History  Diagnosis Date  . Gonorrhea   . Bacterial vaginosis   . Psoriasis   . Vaginal Pap smear, abnormal   . Hypertension    Past Surgical History  Procedure Laterality Date  . Tooth extraction    . No past surgeries    . Cesarean section N/A 10/21/2014    Procedure: CESAREAN SECTION;  Surgeon: Tracey Harries, MD;  Location: [facility];  Service: Obstetrics;  Laterality: N/A;   Family History  Problem Relation Age of Onset  . Arthritis Mother   . Depression Mother   . Hyperlipidemia Mother   . Learning disabilities Mother   . Mental illness Mother   . Varicose Veins Mother   . Alcohol abuse Father   . Arthritis Father   . Asthma Father   . Cancer Father   . Drug abuse Father   . Diabetes Father   . Early death Father   . Heart disease Father   . Learning disabilities Sister   . Hyperlipidemia Maternal Grandmother   . Hyperlipidemia Maternal Grandfather    History  Substance Use Topics  . Smoking status: Current Every Day Smoker -- 1.00 packs/day for 10 years    Types: Cigarettes    Last Attempt to Quit: 10/17/2014  . Smokeless tobacco: Current User  . Alcohol Use: 0.0 oz/week    0 Standard drinks or equivalent per week     Comment: Rare   OB History    Gravida Para Term Preterm AB TAB SAB Ectopic Multiple Living   1 1  1      0 1     Review of Systems  All other systems reviewed and are  negative.   Allergies  Review of patient's allergies indicates no known allergies.  Home Medications   Prior to Admission medications   Medication Sig Start Date End Date Taking? Authorizing Provider  carvedilol (COREG) 6.25 MG tablet Take 0.5 tablets (3.125 mg total) by mouth 2 (two) times daily with a meal. 11/07/14  Yes Laurey Morale, MD  ibuprofen (ADVIL,MOTRIN) 200 MG tablet Take 200 mg by mouth every 6 (six) hours as needed.   Yes Historical Provider, MD  losartan (COZAAR) 25 MG tablet Take 1 tablet (25 mg total) by mouth daily. 01/13/15  Yes Laurey Morale, MD  spironolactone (ALDACTONE) 25 MG tablet Take 0.5 tablets (12.5 mg total) by mouth daily. 11/07/14  Yes Laurey Morale, MD  amoxicillin (AMOXIL) 500 MG capsule Take 1 capsule (500 mg total) by mouth 2 (two) times daily. 02/28/15 03/10/15  Riki Sheer, PA-C  citalopram (CELEXA) 20 MG tablet Take 1 tablet (20 mg total) by mouth daily. 12/10/14   Dolores Patty, MD  HYDROcodone-homatropine [facility]) 5-1.5 MG/5ML syrup Take 5 mLs by mouth every 6 (six) hours as needed for cough. 02/28/15   Riki Sheer, PA-C   BP 149/91 mmHg  Pulse 75  Temp(Src) 98.5 F (  36.9 C) (Oral)  Resp 20  SpO2 100% Physical Exam  Constitutional: She is oriented to person, place, and time. She appears well-developed and well-nourished. No distress.  HENT:  Head: Normocephalic and atraumatic.  Bilateral ears with retro TM fluid and erythema  Eyes: Right eye exhibits no discharge. Left eye exhibits no discharge.  Neck: Normal range of motion.  Cardiovascular: Normal rate and regular rhythm.   Pulmonary/Chest: Effort normal and breath sounds normal. No respiratory distress.  Lymphadenopathy:    She has cervical adenopathy.  Neurological: She is alert and oriented to person, place, and time.  Skin: Skin is warm and dry. She is not diaphoretic.  Psychiatric: Her behavior is normal.  Nursing note and vitals reviewed.   ED Course  Procedures  (including critical care time) Labs Review Labs Reviewed - No data to display  Imaging Review No results found.   MDM   1. URI (upper respiratory infection)   2. Cough    Given duration and exam treat with Antibiotic and supportive care. F/U if worsens.     Riki Sheer, PA-C 02/28/15 [phone]

## 2015-02-28 NOTE — Discharge Instructions (Signed)
Upper Respiratory Infection, Adult An upper respiratory infection (URI) is also known as the common cold. It is often caused by a type of germ (virus). Colds are easily spread (contagious). You can pass it to others by kissing, coughing, sneezing, or drinking out of the same glass. Usually, you get better in 1 or 2 weeks.  HOME CARE   Only take medicine as told by your doctor.  Use a warm mist humidifier or breathe in steam from a hot shower.  Drink enough water and fluids to keep your pee (urine) clear or pale yellow.  Get plenty of rest.  Return to work when your temperature is back to normal or as told by your doctor. You may use a face mask and wash your hands to stop your cold from spreading. GET HELP RIGHT AWAY IF:   After the first few days, you feel you are getting worse.  You have questions about your medicine.  You have chills, shortness of breath, or brown or red spit (mucus).  You have yellow or brown snot (nasal discharge) or pain in the face, especially when you bend forward.  You have a fever, puffy (swollen) neck, pain when you swallow, or white spots in the back of your throat.  You have a bad headache, ear pain, sinus pain, or chest pain.  You have a high-pitched whistling sound when you breathe in and out (wheezing).  You have a lasting cough or cough up blood.  You have sore muscles or a stiff neck. MAKE SURE YOU:   Understand these instructions.  Will watch your condition.  Will get help right away if you are not doing well or get worse. Document Released: 01/04/2008 Document Revised: 10/10/2011 Document Reviewed: 10/23/2013 [facility] Patient Information 2015 [place], [place]. This information is not intended to replace advice given to you by your health care provider. Make sure you discuss any questions you have with your health care provider.    Covering your for a bacterial infection. Take all of your antibiotics. The cough medication will help for  night time. May also use Delsym for day time. Take Motrin  every 8 hours as well. Mucinex as directed with plently of fluids.

## 2015-02-28 NOTE — ED Notes (Signed)
C/o cold sx onset 4 days associated w/prod cough, ST, swollen lymph nodes, chest d/c due to cough, runny nose, congestion Denies fevers, chills Alert, no signs of acute distress.

## 2015-03-03 LAB — CULTURE, GROUP A STREP: Strep A Culture: NEGATIVE

## 2015-03-05 NOTE — ED Notes (Signed)
Final report strep negative 

## 2015-04-21 ENCOUNTER — Telehealth (HOSPITAL_COMMUNITY): Payer: Self-pay | Admitting: *Deleted

## 2015-04-21 NOTE — Telephone Encounter (Signed)
Patient returned call and states now has medicaid. Patient is now seeing her current OB-GYN. Advised patient would need to get medical record from [facility] faxed to her Dr. Patient voiced understanding.

## 2015-04-21 NOTE — Telephone Encounter (Signed)
Telephoned patient at home number and left message. Also telephoned patient at mother's # and left message. Previously left message on 8/30 with no return call.

## 2015-06-14 ENCOUNTER — Telehealth: Payer: Self-pay | Admitting: Physician Assistant

## 2015-06-14 ENCOUNTER — Encounter (HOSPITAL_COMMUNITY): Payer: Self-pay | Admitting: Emergency Medicine

## 2015-06-14 ENCOUNTER — Emergency Department (HOSPITAL_COMMUNITY)
Admission: EM | Admit: 2015-06-14 | Discharge: 2015-06-14 | Disposition: A | Discharge status: Home / Self Care | Payer: Medicaid Other | Attending: Emergency Medicine | Admitting: Emergency Medicine

## 2015-06-14 ENCOUNTER — Emergency Department (HOSPITAL_COMMUNITY): Payer: Medicaid Other

## 2015-06-14 DIAGNOSIS — Z79899 Other long term (current) drug therapy: Secondary | ICD-10-CM | POA: Diagnosis not present

## 2015-06-14 DIAGNOSIS — Z872 Personal history of diseases of the skin and subcutaneous tissue: Secondary | ICD-10-CM | POA: Insufficient documentation

## 2015-06-14 DIAGNOSIS — R079 Chest pain, unspecified: Secondary | ICD-10-CM

## 2015-06-14 DIAGNOSIS — Z8619 Personal history of other infectious and parasitic diseases: Secondary | ICD-10-CM | POA: Diagnosis not present

## 2015-06-14 DIAGNOSIS — M549 Dorsalgia, unspecified: Secondary | ICD-10-CM | POA: Diagnosis not present

## 2015-06-14 DIAGNOSIS — G44209 Tension-type headache, unspecified, not intractable: Secondary | ICD-10-CM | POA: Diagnosis not present

## 2015-06-14 DIAGNOSIS — I5020 Unspecified systolic (congestive) heart failure: Secondary | ICD-10-CM | POA: Diagnosis not present

## 2015-06-14 DIAGNOSIS — Z8742 Personal history of other diseases of the female genital tract: Secondary | ICD-10-CM | POA: Diagnosis not present

## 2015-06-14 DIAGNOSIS — F17210 Nicotine dependence, cigarettes, uncomplicated: Secondary | ICD-10-CM | POA: Diagnosis not present

## 2015-06-14 DIAGNOSIS — I10 Essential (primary) hypertension: Secondary | ICD-10-CM | POA: Diagnosis not present

## 2015-06-14 LAB — BASIC METABOLIC PANEL
ANION GAP: 8 (ref 5–15)
BUN: 11 mg/dL (ref 6–20)
CHLORIDE: 105 mmol/L (ref 101–111)
CO2: 23 mmol/L (ref 22–32)
Calcium: 10.4 mg/dL — ABNORMAL HIGH (ref 8.9–10.3)
Creatinine, Ser: 0.62 mg/dL (ref 0.44–1.00)
GFR calc non Af Amer: >60 mL/min (ref >60)
GLUCOSE: 118 mg/dL — AB (ref 65–99)
Potassium: 4.1 mmol/L (ref 3.5–5.1)
Sodium: 136 mmol/L (ref 135–145)

## 2015-06-14 LAB — CBC
HCT: 44.3 % (ref 36.0–46.0)
Hemoglobin: 15.5 g/dL — ABNORMAL HIGH (ref 12.0–15.0)
MCH: 29.2 pg (ref 26.0–34.0)
MCHC: 35 g/dL (ref 30.0–36.0)
MCV: 83.4 fL (ref 78.0–100.0)
Platelets: 245 K/uL (ref 150–400)
RBC: 5.31 MIL/uL — ABNORMAL HIGH (ref 3.87–5.11)
RDW: 13.6 % (ref 11.5–15.5)
WBC: 9.8 K/uL (ref 4.0–10.5)

## 2015-06-14 LAB — I-STAT TROPONIN, ED: Troponin i, poc: 0 ng/mL (ref 0.00–0.08)

## 2015-06-14 LAB — BRAIN NATRIURETIC PEPTIDE: B Natriuretic Peptide: 11.7 pg/mL (ref 0.0–100.0)

## 2015-06-14 MED ORDER — BUTALBITAL-APAP-CAFFEINE 50-325-40 MG PO TABS: 1.0000 | ORAL_TABLET | Freq: Four times a day (QID) | ORAL | Status: AC | PRN

## 2015-06-14 MED ORDER — BUTALBITAL-APAP-CAFFEINE 50-325-40 MG PO TABS
1.0000 | ORAL_TABLET | Freq: Once | ORAL | Status: AC
  Administered 2015-06-14: 1 via ORAL
  Filled 2015-06-14: qty 1

## 2015-06-14 NOTE — Discharge Instructions (Signed)
Please follow up closely with your heart doctor this week for further evaluation of your health and to determine which heart medication is appropriate for your.  Take fioricet as needed for headache.  If no improvement you can try tylenol or ibuprofen.  Return to ER if your condition worsen or if you have other concerns.   Tension Headache A tension headache is a feeling of pain, pressure, or aching that is often felt over the front and sides of the head. The pain can be dull, or it can feel tight (constricting). Tension headaches are not normally associated with nausea or vomiting, and they do not get worse with physical activity. Tension headaches can last from 30 minutes to several days. This is the most common type of headache. CAUSES The exact cause of this condition is not known. Tension headaches often begin after stress, anxiety, or depression. Other triggers may include:  Alcohol.  Too much caffeine, or caffeine withdrawal.  Respiratory infections, such as colds, flu, or sinus infections.  Dental problems or teeth clenching.  Fatigue.  Holding your head and neck in the same position for a long period of time, such as while using a computer.  Smoking. SYMPTOMS Symptoms of this condition include:  A feeling of pressure around the head.  Dull, aching head pain.  Pain felt over the front and sides of the head.  Tenderness in the muscles of the head, neck, and shoulders. DIAGNOSIS This condition may be diagnosed based on your symptoms and a physical exam. Tests may be done, such as a CT scan or an MRI of your head. These tests may be done if your symptoms are severe or unusual. TREATMENT This condition may be treated with lifestyle changes and medicines to help relieve symptoms. HOME CARE INSTRUCTIONS Managing Pain  Take over-the-counter and prescription medicines only as told by your health care provider.  Lie down in a dark, quiet room when you have a headache.  If  directed, apply ice to the head and neck area:  Put ice in a plastic bag.  Place a towel between your skin and the bag.  Leave the ice on for 20 minutes, 2-3 times per day.  Use a heating pad or a hot shower to apply heat to the head and neck area as told by your health care provider. Eating and Drinking  Eat meals on a regular schedule.  Limit alcohol use.  Decrease your caffeine intake, or stop using caffeine. General Instructions  Keep all follow-up visits as told by your health care provider. This is important.  Keep a headache journal to help find out what may trigger your headaches. For example, write down:  What you eat and drink.  How much sleep you get.  Any change to your diet or medicines.  Try massage or other relaxation techniques.  Limit stress.  Sit up straight, and avoid tensing your muscles.  Do not use tobacco products, including cigarettes, chewing tobacco, or e-cigarettes. If you need help quitting, ask your health care provider.  Exercise regularly as told by your health care provider.  Get 7-9 hours of sleep, or the amount recommended by your health care provider. SEEK MEDICAL CARE IF:  Your symptoms are not helped by medicine.  You have a headache that is different from what you normally experience.  You have nausea or you vomit.  You have a fever. SEEK IMMEDIATE MEDICAL CARE IF:  Your headache becomes severe.  You have repeated vomiting.  You have  a stiff neck.  You have a loss of vision.  You have problems with speech.  You have pain in your eye or ear.  You have muscular weakness or loss of muscle control.  You lose your balance or you have trouble walking.  You feel faint or you pass out.  You have confusion.   This information is not intended to replace advice given to you by your health care provider. Make sure you discuss any questions you have with your health care provider.   Document Released: 07/18/2005 Document  Revised: 04/08/2015 Document Reviewed: 11/10/2014 Elsevier Interactive Patient Education [employer]

## 2015-06-14 NOTE — Telephone Encounter (Signed)
Lauren Sharp is a 24 y.o. 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 Most recent echocardiogram demonstrated improved LV function. Patient called in to the answering service with 2 days of a multitude of symptoms. She describes a headache, blurry vision, mid back pain. She notes a cough with greenish sputum production. She has pleuritic chest discomfort. She is not certain if she has been short of breath. She denies edema. She denies syncope but has been dizzy. She also describes tinnitus. She denies fevers. Given her multitude of symptoms, I have suggested that she go the emergency room for further evaluation. She agrees with this plan. Tereso NewcomerScott Mayda Shippee, PA-C   06/14/2015 2:49 PM

## 2015-06-14 NOTE — ED Provider Notes (Signed)
CSN: [id number]     Arrival date & time 06/14/15  1459 History   First MD Initiated Contact with Patient 06/14/15 1639     Chief Complaint  Patient presents with  . Chest Pain  . Back Pain  . 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 Patient states for the past 3-4 days she has had persistent headache. She describes headache as a sharp and throbbing sensation to her forehead radiates to the back of her head with occasional blurry vision. Pt does not normally have headache, and no hx of migraine. Furthermore she also complaining of intermittent sharp pain to her mid chest radiates to the back for chest symptoms worsen with taking deep breath. Endorse occasional shortness of breath with laying flat or when taking deep breath. Endorse occasional cough with productive with brown sputum. Her pain is moderate in severity. She has been taking ibuprofen occasionally without adequate relief. She denies having a fever, chills, double vision, runny nose, sneezing, sore throat, ear pain, neck pain, hemoptysis, abdominal pain, nausea vomiting diarrhea, diaphoresis, or new rash. She denies any treatment tried today. No prior history of PE or DVT, no recent surgery, prolonged bed rest, unilateral leg swelling or calf pain, taking oral birth control pill, or having active cancer. Pt does admits that she has been stressed out lately.  She's not compliant with her medication.  She continues to smoke. Denies alcohol abuse.   Past Medical History  Diagnosis Date  . Gonorrhea   . Bacterial vaginosis   . Psoriasis   . Vaginal Pap smear, abnormal   . Hypertension    Past Surgical History  Procedure Laterality Date  . Tooth extraction    . No past  surgeries    . Cesarean section N/A 10/21/2014    Procedure: CESAREAN SECTION;  Surgeon: Tracey Harries, MD;  Location: [facility];  Service: Obstetrics;  Laterality: N/A;   Family History  Problem Relation Age of Onset  . Arthritis Mother   . Depression Mother   . Hyperlipidemia Mother   . Learning disabilities Mother   . Mental illness Mother   . Varicose Veins Mother   . Alcohol abuse Father   . Arthritis Father   . Asthma Father   . Cancer Father   . Drug abuse Father   . Diabetes Father   . Early death Father   . Heart disease Father   . Learning disabilities Sister   . Hyperlipidemia Maternal Grandmother   . Hyperlipidemia Maternal Grandfather    Social History  Substance Use Topics  . Smoking status: Current Every Day Smoker -- 1.00 packs/day for 10 years    Types: Cigarettes    Last Attempt to Quit: 10/17/2014  . Smokeless tobacco: Current User  . Alcohol Use: 0.0 oz/week    0 Standard drinks or equivalent per week     Comment: Rare   OB History    Gravida Para Term Preterm AB TAB SAB Ectopic Multiple Living   0 1     Review of Systems  All other systems reviewed and are negative.     Allergies  Review of patient's allergies indicates no known allergies.  Home Medications   Prior to Admission medications   Medication Sig Start Date End Date Taking? Authorizing  Provider  carvedilol (COREG) 6.25 MG tablet Take 0.5 tablets (3.125 mg total) by mouth 2 (two) times daily with a meal. 11/07/14   Laurey Moralealton S McLean, MD  citalopram (CELEXA) 20 MG tablet Take 1 tablet (20 mg total) by mouth daily. 12/10/14   Dolores Pattyaniel R Bensimhon, MD  HYDROcodone-homatropine [facility](HYCODAN) 5-1.5 MG/5ML syrup Take 5 mLs by mouth every 6 (six) hours as needed for cough. 02/28/15   Riki SheerMichelle G Young, PA-C  ibuprofen (ADVIL,MOTRIN) 200 MG tablet Take 200 mg by mouth every 6 (six) hours as needed.    Historical Provider, MD  losartan (COZAAR) 25 MG tablet Take 1 tablet (25 mg total) by mouth  daily. 01/13/15   Laurey Moralealton S McLean, MD  spironolactone (ALDACTONE) 25 MG tablet Take 0.5 tablets (12.5 mg total) by mouth daily. 11/07/14   Laurey Moralealton S McLean, MD   BP 141/102 mmHg  Pulse 86  Temp(Src) 98 F (36.7 C) (Oral)  Resp 18  Ht 5\' 5"  (1.651 m)  Wt 200 lb (90.719 kg)  BMI 33.28 kg/m2  SpO2 98%  Breastfeeding? No Physical Exam  Constitutional: She is oriented to person, place, and time. She appears well-developed and well-nourished. No distress.  Caucasian female appears to be in no acute distress, nontoxic  HENT:  Head: Atraumatic.  Mouth/Throat: Oropharynx is clear and moist.  Eyes: Conjunctivae are normal.  Neck: Neck supple. No JVD present.  Cardiovascular: Normal rate and regular rhythm.  Exam reveals no gallop and no friction rub.   No murmur heard. Pulmonary/Chest: Effort normal and breath sounds normal. No respiratory distress. She has no wheezes. She has no rales. She exhibits no tenderness.  Abdominal: Soft. There is no tenderness.  Musculoskeletal: She exhibits no edema or tenderness.  Neurological: She is alert and oriented to person, place, and time. She has normal strength. No cranial nerve deficit or sensory deficit. She displays a negative Romberg sign. Coordination and gait normal. GCS eye subscore is 4. GCS verbal subscore is 5. GCS motor subscore is 6.  Skin: No rash noted.  Psychiatric: She has a normal mood and affect.  Nursing note and vitals reviewed.   ED Course  Procedures (including critical care time)  4:58 PM Patient with history of postpartum cardiomyopathy here with chest pain and headache. Her headache is gradual onset, no red flags. No sudden onset than glad headache concerning for subarachnoid hemorrhage, no focal neuro deficit concerning for stroke, no fever or nuchal rigidity concerning for meningitis. Her blood pressure is controlled. Chest pain is atypical for ACS. Her HEART score is 1. Pt is PERC negative, low suspicion for PE.  She does have  history of CHF and is complaining of chest pain shortness of breath, therefore I will obtain a BNP for further valuation. However, patient does not appear fluid overloaded on exam. Her chest x-ray is unremarkable and her labs are reassuring. Fioricet given for her headache.  Care discussed with Dr. Jeraldine LootsLockwood who has evaluated pt and agrees with plan.  Pt does not take her prescribed medication. She will need to f/u with cardiologist next week for medication reconciliation.    5:57 PM Pt report improvement of headache with fioricet.  She understand to f/u with her cardiologist promptly.  Strict return precaution discussed. No evidence to suggest CHF exacerbation at this time.   Labs Review Labs Reviewed  BASIC METABOLIC PANEL - Abnormal; Notable for the following:    Glucose, Bld 118 (*)    Calcium 10.4 (*)    All other  components within normal limits  CBC - Abnormal; Notable for the following:    RBC 5.31 (*)    Hemoglobin 15.5 (*)    All other components within normal limits  I-STAT TROPOININ, ED    Imaging Review Dg Chest 2 View  06/14/2015  CLINICAL DATA:  Chest pain radiating into the back with associated shortness of breath. History of hypertension. EXAM: CHEST  2 VIEW COMPARISON:  01/13/2015 FINDINGS: The heart size and mediastinal contours are within normal limits. Both lungs are clear. No pleural effusion or pneumothorax. The visualized skeletal structures are unremarkable. IMPRESSION: No active cardiopulmonary disease. Electronically Signed   By: Amie Portland M.D.   On: 06/14/2015 15:27   I have personally reviewed and evaluated these images and lab results as part of my medical decision-making.   EKG Interpretation None      Date: 06/14/2015  Rate: 99  Rhythm: normal sinus rhythm  QRS Axis: normal  Intervals: normal  ST/T Wave abnormalities: normal  Conduction Disutrbances: none  Narrative Interpretation:   Old EKG Reviewed: No significant changes noted     MDM    Final diagnoses:  Tension headache  Chest pain, unspecified chest pain type    BP 141/102 mmHg  Pulse 86  Temp(Src) 98 F (36.7 C) (Oral)  Resp 18  Ht  (1.651 m)  Wt 200 lb (90.719 kg)  BMI 33.28 kg/m2  SpO2 98%  Breastfeeding? No     Fayrene Helper, PA-C 06/14/15 1800  Gerhard Munch, MD 06/15/15 [phone]

## 2015-06-14 NOTE — ED Notes (Signed)
Pt stable, ambulatory, states understanding of discharge instructions 

## 2015-06-14 NOTE — ED Notes (Signed)
Pt c/o center chest pain that radiates around back x few days with shortness of breath.

## 2015-09-02 ENCOUNTER — Encounter (HOSPITAL_COMMUNITY): Payer: Self-pay | Admitting: *Deleted

## 2015-09-02 ENCOUNTER — Emergency Department (INDEPENDENT_AMBULATORY_CARE_PROVIDER_SITE_OTHER)
Admission: EM | Admit: 2015-09-02 | Discharge: 2015-09-02 | Disposition: A | Discharge status: Home / Self Care | Payer: Medicaid Other | Source: Home / Self Care | Attending: Family Medicine | Admitting: Family Medicine

## 2015-09-02 ENCOUNTER — Other Ambulatory Visit (HOSPITAL_COMMUNITY)
Admission: RE | Admit: 2015-09-02 | Discharge: 2015-09-02 | Disposition: A | Discharge status: Home / Self Care | Payer: Medicaid Other | Source: Ambulatory Visit | Attending: Family Medicine | Admitting: Family Medicine

## 2015-09-02 DIAGNOSIS — J069 Acute upper respiratory infection, unspecified: Secondary | ICD-10-CM | POA: Diagnosis not present

## 2015-09-02 LAB — POCT RAPID STREP A: STREPTOCOCCUS, GROUP A SCREEN (DIRECT): NEGATIVE

## 2015-09-02 MED ORDER — IPRATROPIUM BROMIDE 0.06 % NA SOLN: 2.0000 | Freq: Four times a day (QID) | NASAL | Status: DC

## 2015-09-02 NOTE — Discharge Instructions (Signed)
Drink lots of fluids, take medicine and use lozenges as needed.return if needed

## 2015-09-02 NOTE — ED Provider Notes (Addendum)
CSN: [id number]     Arrival date & time 09/02/15  1305 History   First MD Initiated Contact with Patient 09/02/15 1331     Chief Complaint  Patient presents with  . Sore Throat   (Consider location/radiation/quality/duration/timing/severity/associated sxs/prior Treatment) Patient is a 25 y.o. female presenting with pharyngitis. The history is provided by the patient.  Sore Throat This is a new problem. The current episode started 2 days ago. The problem has been gradually worsening. Associated symptoms include headaches. Pertinent negatives include no chest pain, no abdominal pain and no shortness of breath. The symptoms are aggravated by swallowing.    Past Medical History  Diagnosis Date  . Gonorrhea   . Bacterial vaginosis   . Psoriasis   . Vaginal Pap smear, abnormal   . Hypertension    Past Surgical History  Procedure Laterality Date  . Tooth extraction    . No past surgeries    . Cesarean section N/A 10/21/2014    Procedure: CESAREAN SECTION;  Surgeon: Tracey Harries, MD;  Location: [facility];  Service: Obstetrics;  Laterality: N/A;   Family History  Problem Relation Age of Onset  . Arthritis Mother   . Depression Mother   . Hyperlipidemia Mother   . Learning disabilities Mother   . Mental illness Mother   . Varicose Veins Mother   . Alcohol abuse Father   . Arthritis Father   . Asthma Father   . Cancer Father   . Drug abuse Father   . Diabetes Father   . Early death Father   . Heart disease Father   . Learning disabilities Sister   . Hyperlipidemia Maternal Grandmother   . Hyperlipidemia Maternal Grandfather    Social History  Substance Use Topics  . Smoking status: Current Every Day Smoker -- 1.00 packs/day for 10 years    Types: Cigarettes    Last Attempt to Quit: 10/17/2014  . Smokeless tobacco: Current User  . Alcohol Use: 0.0 oz/week    0 Standard drinks or equivalent per week     Comment: Rare   OB History    Gravida Para Term Preterm AB TAB SAB  Ectopic Multiple Living   0 1     Review of Systems  Constitutional: Negative.   HENT: Positive for congestion, postnasal drip and rhinorrhea. Negative for facial swelling.   Respiratory: Negative.  Negative for shortness of breath.   Cardiovascular: Negative.  Negative for chest pain.  Gastrointestinal: Negative for abdominal pain.  Neurological: Positive for headaches.  All other systems reviewed and are negative.   Allergies  Review of patient's allergies indicates no known allergies.  Home Medications   Prior to Admission medications   Medication Sig Start Date End Date Taking? Authorizing Provider  butalbital-acetaminophen-caffeine (FIORICET) 50-325-40 MG tablet Take 1-2 tablets by mouth every 6 (six) hours as needed for headache. 06/14/15 06/13/16  Fayrene Helper, PA-C  carvedilol (COREG) 6.25 MG tablet Take 0.5 tablets (3.125 mg total) by mouth 2 (two) times daily with a meal. Patient taking differently: Take 6.25 mg by mouth 2 (two) times daily with a meal.  11/07/14   Laurey Morale, MD  citalopram (CELEXA) 20 MG tablet Take 1 tablet (20 mg total) by mouth daily. 12/10/14   Dolores Patty, MD  HYDROcodone-homatropine [facility]) 5-1.5 MG/5ML syrup Take 5 mLs by mouth every 6 (six) hours as needed for cough. 02/28/15   Riki Sheer, PA-C  ibuprofen (ADVIL,MOTRIN) 200  MG tablet Take 400 mg by mouth every 6 (six) hours as needed (pain).     Historical Provider, MD  ipratropium (ATROVENT) 0.06 % nasal spray Place 2 sprays into both nostrils 4 (four) times daily. 09/02/15   Linna Hoff, MD  losartan (COZAAR) 25 MG tablet Take 1 tablet (25 mg total) by mouth daily. 01/13/15   Laurey Morale, MD  metroNIDAZOLE (FLAGYL) 500 MG tablet Take 500 mg by mouth 2 (two) times daily.    Historical Provider, MD  spironolactone (ALDACTONE) 25 MG tablet Take 0.5 tablets (12.5 mg total) by mouth daily. Patient taking differently: Take 25 mg by mouth daily.  11/07/14   Laurey Morale, MD    Meds Ordered and Administered this Visit  Medications - No data to display  BP 148/86 mmHg  Pulse 77  Temp(Src) 98.4 F (36.9 C) (Oral)  Resp 16  SpO2 100% No data found.   Physical Exam  Constitutional: She is oriented to person, place, and time. She appears well-developed and well-nourished. No distress.  HENT:  Head: Normocephalic.  Right Ear: External ear normal.  Left Ear: External ear normal.  Nose: Mucosal edema and rhinorrhea present.  Mouth/Throat: Oropharynx is clear and moist.  Eyes: Pupils are equal, round, and reactive to light.  Neck: Normal range of motion. Neck supple.  Cardiovascular: Normal rate, regular rhythm and normal heart sounds.   Lymphadenopathy:    She has cervical adenopathy.  Neurological: She is alert and oriented to person, place, and time.  Skin: Skin is warm and dry.  Nursing note and vitals reviewed.   ED Course  Procedures (including critical care time)  Labs Review Labs Reviewed  POCT RAPID STREP A  strep neg.  Imaging Review No results found.   Visual Acuity Review  Right Eye Distance:   Left Eye Distance:   Bilateral Distance:    Right Eye Near:   Left Eye Near:    Bilateral Near:         MDM   1. URI (upper respiratory infection)     Meds ordered this encounter  Medications  . ipratropium (ATROVENT) 0.06 % nasal spray    Sig: Place 2 sprays into both nostrils 4 (four) times daily.    Dispense:  15 mL    Refill:  1      Linna Hoff, MD 09/02/15 1357  Linna Hoff, MD 09/02/15 [phone]

## 2015-09-02 NOTE — ED Notes (Signed)
Pt  Reports  Symptoms  Of  sorethroat  With    painfull    Swallowing      X  2  Days           Pain  When  She  Swallows

## 2015-09-05 LAB — CULTURE, GROUP A STREP (THRC)

## 2015-09-07 ENCOUNTER — Telehealth: Payer: Self-pay | Admitting: Internal Medicine

## 2015-09-07 DIAGNOSIS — J020 Streptococcal pharyngitis: Secondary | ICD-10-CM

## 2015-09-07 MED ORDER — PENICILLIN V POTASSIUM 500 MG PO TABS
500.0000 mg | ORAL_TABLET | Freq: Two times a day (BID) | ORAL | Status: DC
Start: 2015-09-07 — End: 2018-02-07

## 2015-09-07 NOTE — ED Notes (Signed)
Throat culture from UC visit 09/02/15 growing strep.  Rx penicillin V sent to [facility] on [address] at [address].  Please let patient know.  LM  Eustace Moore, MD 09/07/15 [phone]

## 2016-03-10 ENCOUNTER — Ambulatory Visit: Payer: Self-pay | Admitting: Certified Nurse Midwife

## 2016-03-11 ENCOUNTER — Encounter (HOSPITAL_COMMUNITY): Payer: Self-pay | Admitting: *Deleted

## 2016-03-11 ENCOUNTER — Ambulatory Visit (HOSPITAL_COMMUNITY)
Admission: EM | Admit: 2016-03-11 | Discharge: 2016-03-11 | Disposition: A | Discharge status: Home / Self Care | Payer: Medicaid Other | Attending: Family Medicine | Admitting: Family Medicine

## 2016-03-11 DIAGNOSIS — L508 Other urticaria: Secondary | ICD-10-CM

## 2016-03-11 MED ORDER — METHYLPREDNISOLONE ACETATE 40 MG/ML IJ SUSP
80.0000 mg | Freq: Once | INTRAMUSCULAR | Status: AC
  Administered 2016-03-11: 80 mg via INTRAMUSCULAR

## 2016-03-11 MED ORDER — METHYLPREDNISOLONE 4 MG PO TBPK: ORAL_TABLET | ORAL | 0 refills | Status: DC

## 2016-03-11 MED ORDER — METHYLPREDNISOLONE ACETATE 80 MG/ML IJ SUSP
INTRAMUSCULAR | Status: AC
  Filled 2016-03-11: qty 1

## 2016-03-11 MED ORDER — TRIAMCINOLONE ACETONIDE 40 MG/ML IJ SUSP
INTRAMUSCULAR | Status: AC
  Filled 2016-03-11: qty 1

## 2016-03-11 MED ORDER — HYDROXYZINE HCL 25 MG PO TABS: 25.0000 mg | ORAL_TABLET | Freq: Four times a day (QID) | ORAL | 0 refills | Status: DC

## 2016-03-11 MED ORDER — TRIAMCINOLONE ACETONIDE 40 MG/ML IJ SUSP
40.0000 mg | Freq: Once | INTRAMUSCULAR | Status: AC
  Administered 2016-03-11: 40 mg via INTRAMUSCULAR

## 2016-03-11 NOTE — ED Provider Notes (Signed)
[facility]    CSN: [id number] Arrival date & time: 03/11/16  1138  First Provider Contact:  First MD Initiated Contact with Patient 03/11/16 1237        History   Chief Complaint Chief Complaint  Patient presents with  . Urticaria  . Pruritis    HPI Lauren Sharp is a 25 y.o. female.   The history is provided by the patient.  Urticaria  This is a new problem. The current episode started 2 days ago. The problem has not changed since onset.Pertinent negatives include no chest pain, no abdominal pain, no headaches and no shortness of breath. Associated symptoms comments: Is on amox from dentist.. Nothing aggravates the symptoms. Nothing relieves the symptoms.    Past Medical History:  Diagnosis Date  . Bacterial vaginosis   . Gonorrhea   . Hypertension   . Psoriasis   . Vaginal Pap smear, abnormal     Patient Active Problem List   Diagnosis Date Noted  . Depression (emotion) 11/18/2014  . UTI (urinary tract infection) 10/31/2014  . Smoking 10/31/2014  . Acute pulmonary edema (HCC)   . Dyspnea   . Respiratory distress   . Acute respiratory failure with hypoxia (HCC) 10/27/2014  . Acute systolic CHF (congestive heart failure) (HCC) 10/27/2014  . 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 Hilar adenopathy 10/27/2014  . H/O cesarean section 10/21/2014  . Preeclampsia complicating hypertension 10/20/2014  . Hypertension affecting pregnancy in third trimester 10/17/2014  . [redacted] weeks gestation of pregnancy   . Gestational hypertension w/o significant proteinuria in 3rd trimester 09/26/2014  . Non-reactive NST (non-stress test)   . [redacted] weeks gestation of pregnancy     Past Surgical History:  Procedure Laterality Date  . CESAREAN SECTION N/A 10/21/2014   Procedure: CESAREAN SECTION;  Surgeon: Tracey Harries, MD;  Location: [facility];  Service: Obstetrics;  Laterality: N/A;  . NO PAST SURGERIES    . TOOTH EXTRACTION      OB History    Gravida Para  Term Preterm AB Living   1 1   1   1    SAB TAB Ectopic Multiple Live Births         0 1       Home Medications    Prior to Admission medications   Medication Sig Start Date End Date Taking? Authorizing Provider  butalbital-acetaminophen-caffeine (FIORICET) 50-325-40 MG tablet Take 1-2 tablets by mouth every 6 (six) hours as needed for headache. 06/14/15 06/13/16  Fayrene Helper, PA-C  carvedilol (COREG) 6.25 MG tablet Take 0.5 tablets (3.125 mg total) by mouth 2 (two) times daily with a meal. Patient taking differently: Take 6.25 mg by mouth 2 (two) times daily with a meal.  11/07/14   Laurey Morale, MD  citalopram (CELEXA) 20 MG tablet Take 1 tablet (20 mg total) by mouth daily. 12/10/14   Dolores Patty, MD  HYDROcodone-homatropine [facility]) 5-1.5 MG/5ML syrup Take 5 mLs by mouth every 6 (six) hours as needed for cough. 02/28/15   Riki Sheer, PA-C  ibuprofen (ADVIL,MOTRIN) 200 MG tablet Take 400 mg by mouth every 6 (six) hours as needed (pain).     Historical Provider, MD  ipratropium (ATROVENT) 0.06 % nasal spray Place 2 sprays into both nostrils 4 (four) times daily. 09/02/15   Linna Hoff, MD  losartan (COZAAR) 25 MG tablet Take 1 tablet (25 mg total) by mouth daily. 01/13/15   Laurey Morale, MD  metroNIDAZOLE (FLAGYL) 500 MG tablet Take  500 mg by mouth 2 (two) times daily.    Historical Provider, MD  penicillin v potassium (VEETID) 500 MG tablet Take 1 tablet (500 mg total) by mouth 2 (two) times daily. 09/07/15   Eustace Moore, MD  spironolactone (ALDACTONE) 25 MG tablet Take 0.5 tablets (12.5 mg total) by mouth daily. Patient taking differently: Take 25 mg by mouth daily.  11/07/14   Laurey Morale, MD    Family History Family History  Problem Relation Age of Onset  . Arthritis Mother   . Depression Mother   . Hyperlipidemia Mother   . Learning disabilities Mother   . Mental illness Mother   . Varicose Veins Mother   . Alcohol abuse Father   . Arthritis Father   .  Asthma Father   . Cancer Father   . Drug abuse Father   . Diabetes Father   . Early death Father   . Heart disease Father   . Learning disabilities Sister   . Hyperlipidemia Maternal Grandmother   . Hyperlipidemia Maternal Grandfather     Social History Social History  Substance Use Topics  . Smoking status: Current Every Day Smoker    Packs/day: 1.00    Years: 10.00    Types: Cigarettes    Last attempt to quit: 10/17/2014  . Smokeless tobacco: Current User  . Alcohol use 0.0 oz/week     Comment: Rare     Allergies   Review of patient's allergies indicates no known allergies.   Review of Systems Review of Systems  Constitutional: Negative.   HENT: Negative.   Respiratory: Negative.  Negative for chest tightness, shortness of breath and wheezing.   Cardiovascular: Negative for chest pain.  Gastrointestinal: Negative.  Negative for abdominal pain.  Genitourinary: Negative.   Skin: Positive for rash.  Neurological: Negative for headaches.  All other systems reviewed and are negative.    Physical Exam Triage Vital Signs ED Triage Vitals  Enc Vitals Group     BP 03/11/16 1220 131/82     Pulse Rate 03/11/16 1220 92     Resp 03/11/16 1220 18     Temp 03/11/16 1220 98.3 F (36.8 C)     Temp Source 03/11/16 1220 Oral     SpO2 03/11/16 1220 99 %     Weight --      Height --      Head Circumference --      Peak Flow --      Pain Score 03/11/16 1221 8     Pain Loc --      Pain Edu? --      Excl. in GC? --    No data found.   Updated Vital Signs BP 131/82 (BP Location: Left Arm)   Pulse 92   Temp 98.3 F (36.8 C) (Oral)   Resp 18   SpO2 99%   Visual Acuity Right Eye Distance:   Left Eye Distance:   Bilateral Distance:    Right Eye Near:   Left Eye Near:    Bilateral Near:     Physical Exam  Constitutional: She is oriented to person, place, and time. She appears well-developed and well-nourished. No distress.  HENT:  Mouth/Throat: Oropharynx is  clear and moist.  Eyes: Conjunctivae are normal.  Neck: Normal range of motion. Neck supple.  Cardiovascular: Normal rate, regular rhythm, normal heart sounds and intact distal pulses.   Pulmonary/Chest: Effort normal and breath sounds normal.  Abdominal: Soft. Bowel sounds are normal.  Lymphadenopathy:    She has no cervical adenopathy.  Neurological: She is alert and oriented to person, place, and time.  Skin: Skin is warm and dry.  Nursing note and vitals reviewed.    UC Treatments / Results  Labs (all labs ordered are listed, but only abnormal results are displayed) Labs Reviewed - No data to display  EKG  EKG Interpretation None       Radiology No results found.  Procedures Procedures (including critical care time)  Medications Ordered in UC Medications  triamcinolone acetonide (KENALOG-40) injection 40 mg (not administered)  methylPREDNISolone acetate (DEPO-MEDROL) injection 80 mg (not administered)     Initial Impression / Assessment and Plan / UC Course  I have reviewed the triage vital signs and the nursing notes.  Pertinent labs & imaging results that were available during my care of the patient were reviewed by me and considered in my medical decision making (see chart for details).  Clinical Course      Final Clinical Impressions(s) / UC Diagnoses   Final diagnoses:  None    New Prescriptions New Prescriptions   No medications on file     Linna HoffJames D Laurencia Roma, MD 03/11/16 1255

## 2016-03-11 NOTE — ED Triage Notes (Signed)
Patient states after cleaning a house on Wednesday she broke onto in generalize hives, over entire body, with itching burning and pain. Patient has been using benadryl and itch cream, states this has held with the swelling around face/eyes. Patient appears very uncomfortable.

## 2016-03-11 NOTE — Discharge Instructions (Signed)
Stop amoxicillin and discuss with dentist. Stop smoking.

## 2016-05-22 IMAGING — US US OB TRANSVAGINAL
1 series · 14 of 28 positions shown · non-contrast
Comparison: None.

CLINICAL DATA: Pelvic pain.  Positive pregnancy test.

EXAM:
OBSTETRIC <14 WK US AND TRANSVAGINAL OB US
TECHNIQUE: Both transabdominal and transvaginal ultrasound examinations were
performed for complete evaluation of the gestation as well as the
maternal uterus, adnexal regions, and pelvic cul-de-sac.
Transvaginal technique was performed to assess early pregnancy.

[Series 1: us ob transvaginal · 0.21mm/px · 14 of 30 slices shown]
[im 2/30]
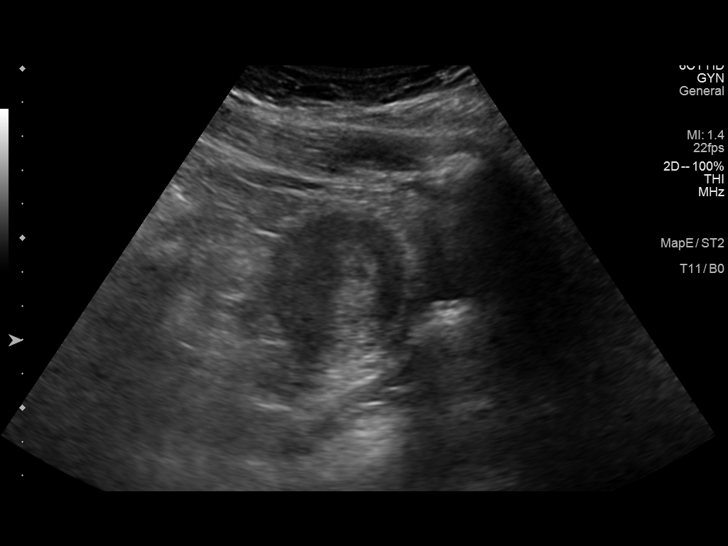
[im 4/30]
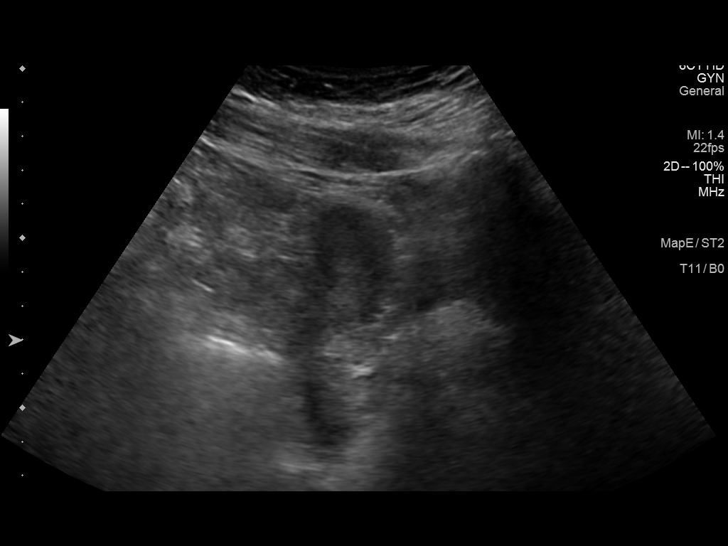
[im 6/30]
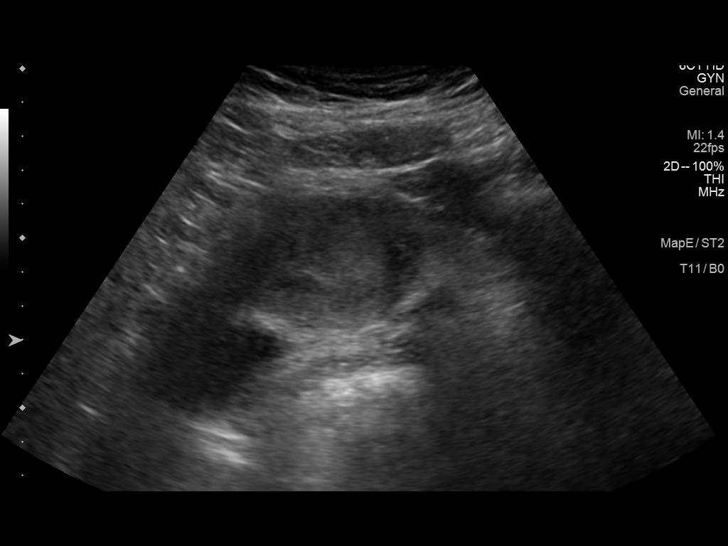
[im 8/30]
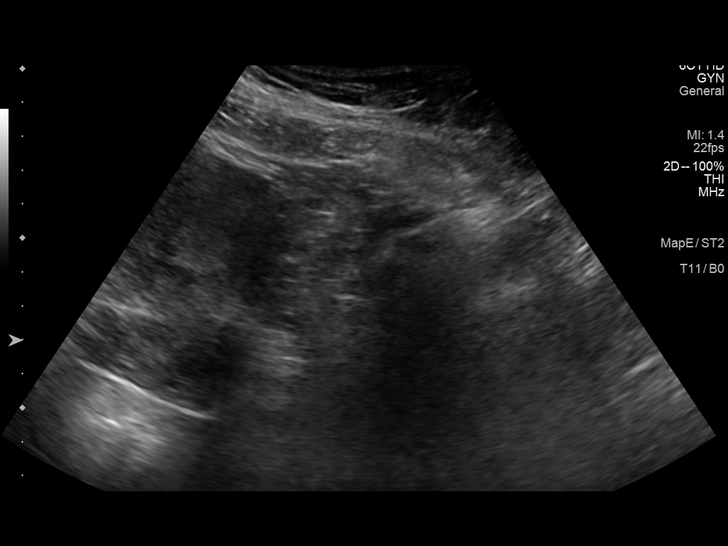
[im 10/30]
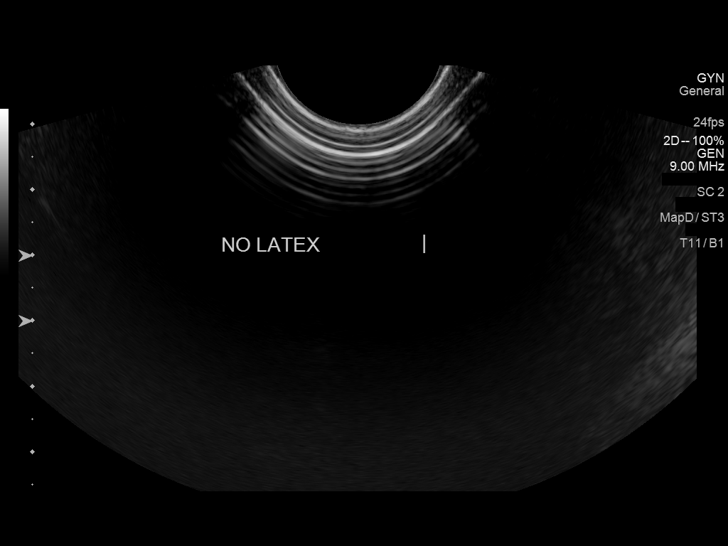
[im 12/30]
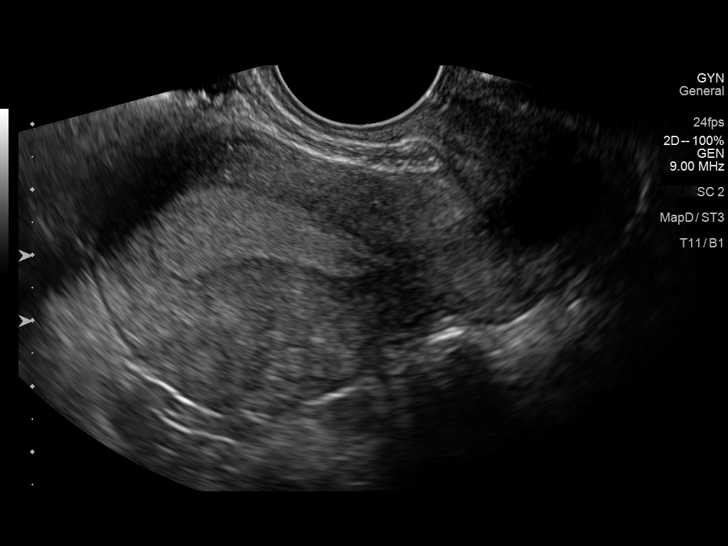
[im 14/30]
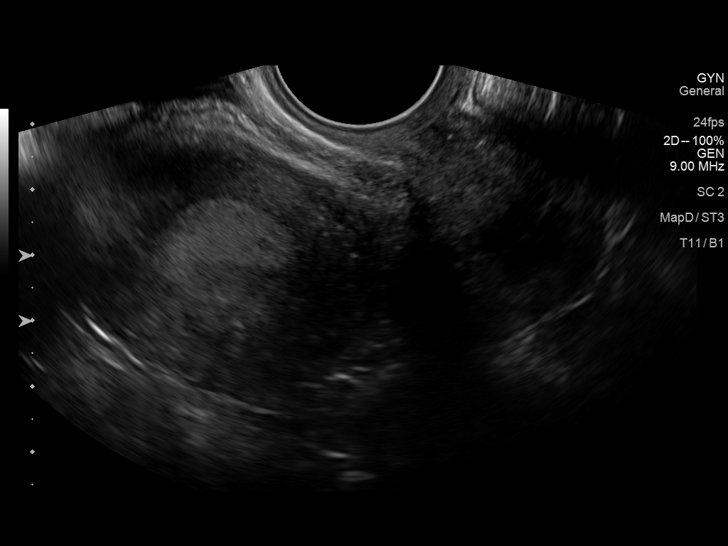
[im 17/30]
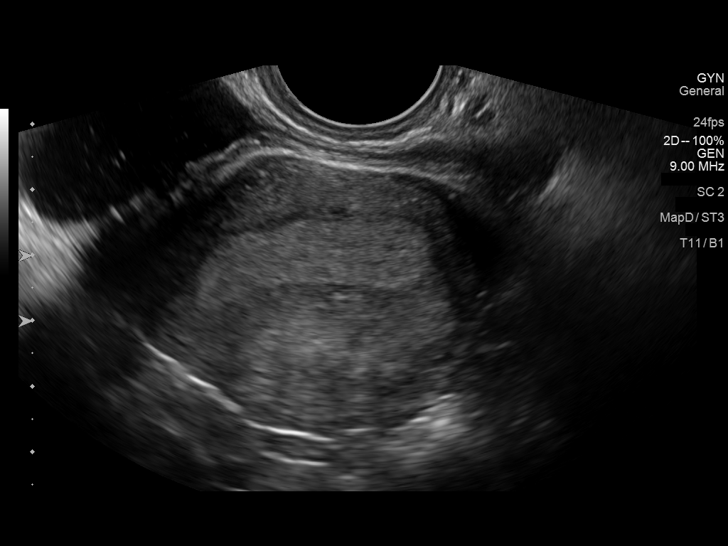
[im 19/30]
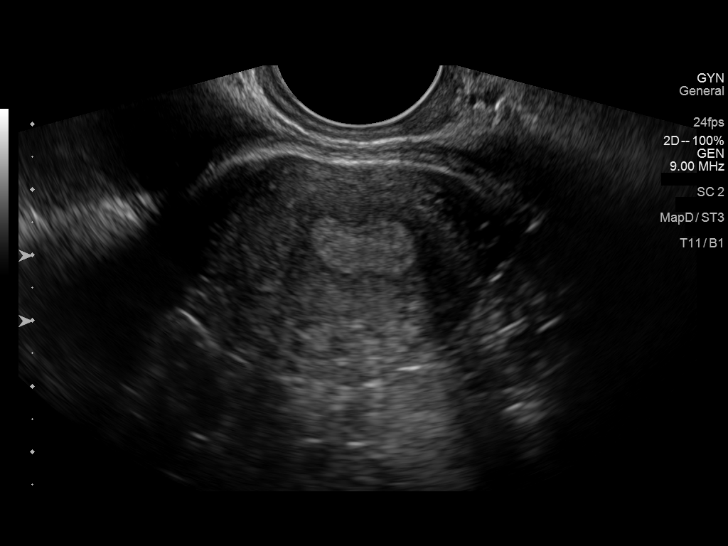
[im 21/30]
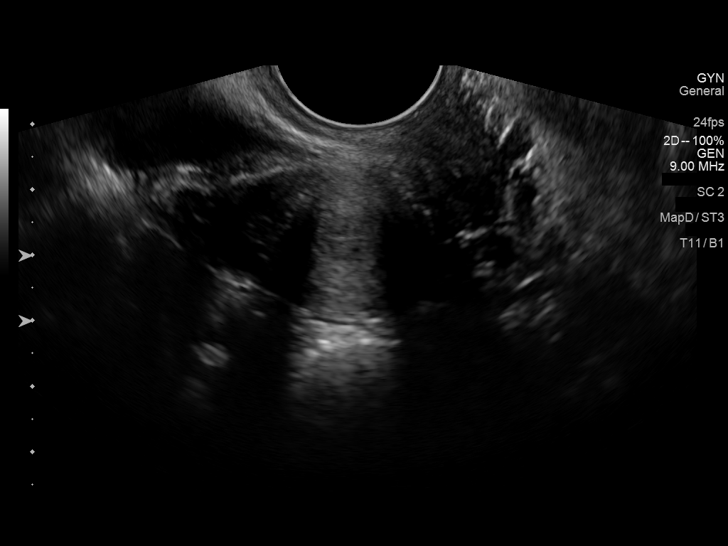
[im 23/30]
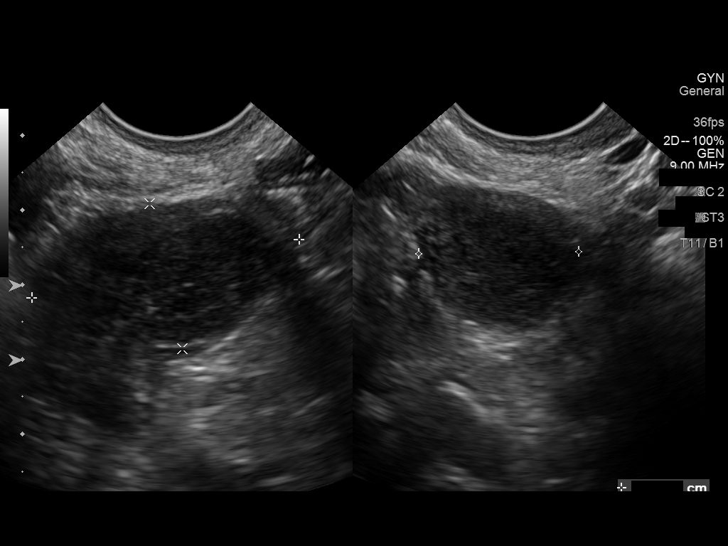
[im 25/30]
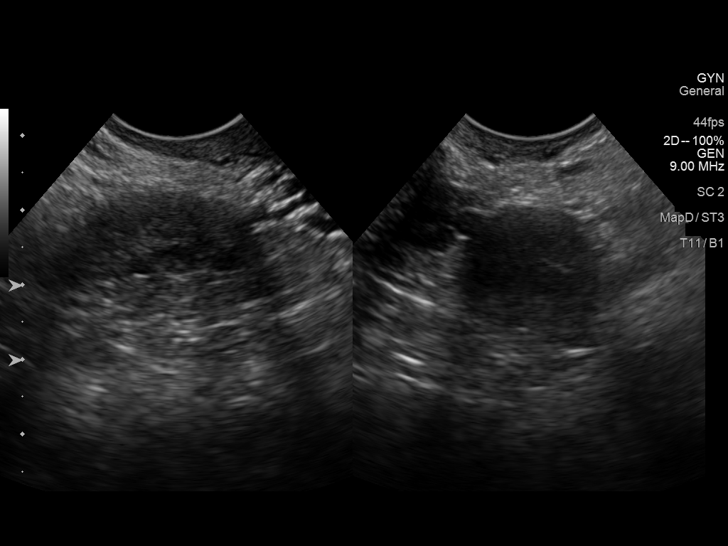
[im 27/30]
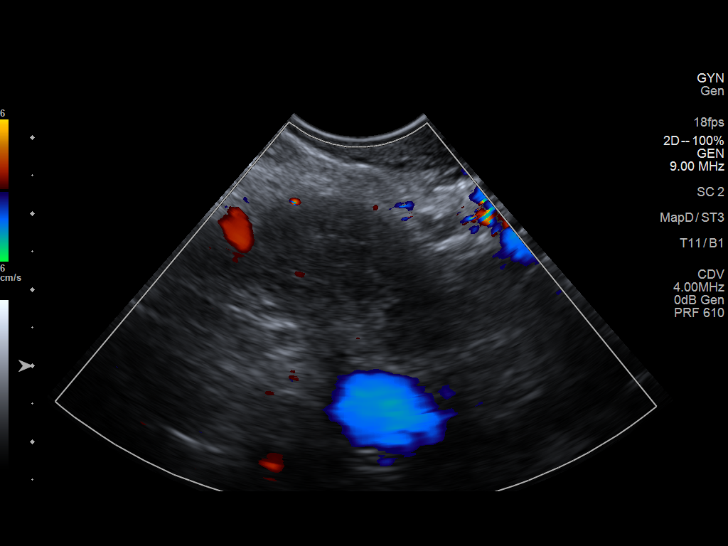
[im 30/30]
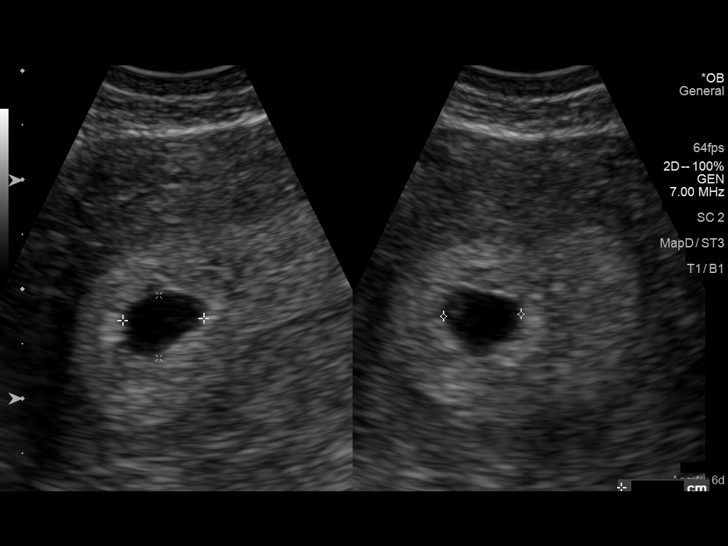

[14 of 28 positions shown; findings below may reference images not displayed]

FINDINGS: Intrauterine gestational sac: Visualized/normal in shape.

Yolk sac:  Visualized

Embryo:  Not visualized

MSD:  7  mm   4 w   5  d

Maternal uterus/adnexae: Small right ovarian corpus luteum noted.
Left ovary is normal in appearance. No adnexal mass or free fluid
identified.
IMPRESSION: Single early intrauterine gestational sac measuring 4 weeks 5 days
by mean sac diameter.

No significant maternal uterine or adnexal abnormality identified.

## 2016-08-09 ENCOUNTER — Encounter: Payer: Self-pay | Admitting: Obstetrics and Gynecology

## 2016-08-09 ENCOUNTER — Other Ambulatory Visit (HOSPITAL_COMMUNITY)
Admission: RE | Admit: 2016-08-09 | Discharge: 2016-08-09 | Disposition: A | Discharge status: Home / Self Care | Payer: Medicaid Other | Source: Ambulatory Visit | Attending: Obstetrics and Gynecology | Admitting: Obstetrics and Gynecology

## 2016-08-09 ENCOUNTER — Ambulatory Visit (INDEPENDENT_AMBULATORY_CARE_PROVIDER_SITE_OTHER): Payer: Medicaid Other | Admitting: Obstetrics and Gynecology

## 2016-08-09 VITALS — BP 124/86 | HR 86 | Temp 98.8°F | Ht 63.0 in | Wt 193.2 lb

## 2016-08-09 DIAGNOSIS — N898 Other specified noninflammatory disorders of vagina: Secondary | ICD-10-CM

## 2016-08-09 DIAGNOSIS — Z01411 Encounter for gynecological examination (general) (routine) with abnormal findings: Secondary | ICD-10-CM | POA: Diagnosis present

## 2016-08-09 DIAGNOSIS — Z975 Presence of (intrauterine) contraceptive device: Secondary | ICD-10-CM | POA: Insufficient documentation

## 2016-08-09 DIAGNOSIS — Z0000 Encounter for general adult medical examination without abnormal findings: Secondary | ICD-10-CM | POA: Diagnosis not present

## 2016-08-09 DIAGNOSIS — Z1151 Encounter for screening for human papillomavirus (HPV): Secondary | ICD-10-CM | POA: Insufficient documentation

## 2016-08-09 DIAGNOSIS — Z309 Encounter for contraceptive management, unspecified: Secondary | ICD-10-CM

## 2016-08-09 DIAGNOSIS — Z113 Encounter for screening for infections with a predominantly sexual mode of transmission: Secondary | ICD-10-CM | POA: Insufficient documentation

## 2016-08-09 DIAGNOSIS — Z01419 Encounter for gynecological examination (general) (routine) without abnormal findings: Secondary | ICD-10-CM

## 2016-08-09 LAB — POCT URINALYSIS DIPSTICK
BILIRUBIN UA: NEGATIVE
Glucose, UA: NEGATIVE
Leukocytes, UA: NEGATIVE
Nitrite, UA: NEGATIVE
Urobilinogen, UA: NEGATIVE
pH, UA: 5

## 2016-08-09 NOTE — Progress Notes (Signed)
Subjective:     Lauren Sharp is a 26 y.o. female here for a routine exam. She is considering changing contraception. She currently uses [place]. Placed in April 2016.  She has some lower abd pain at times and vaginal discharge as well. Cycles have been sparse. 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 Pt is not taking any meds and has not been seen by cardiology for over a year due to no insurance.  Gynecologic History Patient's last menstrual period was 07/29/2016 (approximate). Contraception: IUD Last Pap: uncertain. Results were: unknown Last mammogram: NA.   Obstetric History OB History  Gravida Para Term Preterm AB Living  1 1   1   1   SAB TAB Ectopic Multiple Live Births        0 1    # Outcome Date GA Lbr Len/2nd Weight Sex Delivery Anes PTL Lv  1 Preterm 10/21/14 [redacted]w[redacted]d  4 lb 9.4 oz (2.081 kg) M CS-LTranv Spinal  LIV       The following portions of the patient's history were reviewed and updated as appropriate: allergies, current medications, past family history, past medical history, past social history and past surgical history.  Review of Systems Pertinent items are noted in HPI.    Objective:    BP 124/86   Pulse 86   Temp 98.8 F (37.1 C)   Ht 5\' 3"  (1.6 m)   Wt 193 lb 3.2 oz (87.6 kg)   LMP 07/29/2016 (Approximate)   Breastfeeding? No   BMI 34.22 kg/m   General Appearance:    Alert, cooperative, no distress, appears stated age  Head:    Normocephalic, without obvious abnormality, atraumatic  Eyes:    PERRL, conjunctiva/corneas clear, EOM's intact, fundi    benign, both eyes  Ears:    Normal TM's and external ear canals, both ears  Nose:   Nares normal, septum midline, mucosa normal, no drainage    or sinus tenderness  Throat:   Lips, mucosa, and tongue normal; teeth and gums normal  Neck:   Supple, symmetrical, trachea midline, no adenopathy;    thyroid:  no enlargement/tenderness/nodules; no carotid   bruit or JVD  Back:     Symmetric,  no curvature, ROM normal, no CVA tenderness  Lungs:     Clear to auscultation bilaterally, respirations unlabored  Chest Wall:    No tenderness or deformity   Heart:    Regular rate and rhythm, S1 and S2 normal, no murmur, rub   or gallop  Breast Exam:    No tenderness, masses, or nipple abnormality  Abdomen:     Soft, non-tender, bowel sounds active all four quadrants,    no masses, no organomegaly  Genitalia:    Normal female, scant discharge, IUD string noted, uterus small mobile non tender, no masses  Rectal:    Normal tone, normal prostate, no masses or tenderness;   guaiac negative stool  Extremities:   Extremities normal, atraumatic, no cyanosis or edema  Pulses:   2+ and symmetric all extremities  Skin:   Skin color, texture, turgor normal, no rashes or lesions  Lymph nodes:   Cervical, supraclavicular, and axillary nodes normal  Neurologic:   CNII-XII intact, normal strength, sensation and reflexes    throughout       Assessment:    Healthy female exam.   Vaginal discharge Plan:    Pap, STD testing and wet prep obtained today.  Discussed contraception with pt. Pt made aware that with her [facility]  pregnancy, future pregnancies not recommended. As well her CHTN and cardiac history limits her contraception options. Offered GYN U/S but pt declined d/t no insurance coverage. Information on Nexplanon and ParaGard provided to pt.

## 2016-08-09 NOTE — Patient Instructions (Signed)
Steps to Quit Smoking Smoking tobacco can be bad for your health. It can also affect almost every organ in your body. Smoking puts you and people around you at risk for many serious long-lasting (chronic) diseases. Quitting smoking is hard, but it is one of the best things that you can do for your health. It is never too late to quit. What are the benefits of quitting smoking? When you quit smoking, you lower your risk for getting serious diseases and conditions. They can include:  Lung cancer or lung disease.  Heart disease.  Stroke.  Heart attack.  Not being able to have children (infertility).  Weak bones (osteoporosis) and broken bones (fractures). If you have coughing, wheezing, and shortness of breath, those symptoms may get better when you quit. You may also get sick less often. If you are pregnant, quitting smoking can help to lower your chances of having a baby of low birth weight. What can I do to help me quit smoking? Talk with your doctor about what can help you quit smoking. Some things you can do (strategies) include:  Quitting smoking totally, instead of slowly cutting back how much you smoke over a period of time.  Going to in-person counseling. You are more likely to quit if you go to many counseling sessions.  Using resources and support systems, such as:  Online chats with a counselor.  Phone quitlines.  Printed self-help materials.  Support groups or group counseling.  Text messaging programs.  Mobile phone apps or applications.  Taking medicines. Some of these medicines may have nicotine in them. If you are pregnant or breastfeeding, do not take any medicines to quit smoking unless your doctor says it is okay. Talk with your doctor about counseling or other things that can help you. Talk with your doctor about using more than one strategy at the same time, such as taking medicines while you are also going to in-person counseling. This can help make quitting  easier. What things can I do to make it easier to quit? Quitting smoking might feel very hard at first, but there is a lot that you can do to make it easier. Take these steps:  Talk to your family and friends. Ask them to support and encourage you.  Call phone quitlines, reach out to support groups, or work with a counselor.  Ask people who smoke to not smoke around you.  Avoid places that make you want (trigger) to smoke, such as:  Bars.  Parties.  Smoke-break areas at work.  Spend time with people who do not smoke.  Lower the stress in your life. Stress can make you want to smoke. Try these things to help your stress:  Getting regular exercise.  Deep-breathing exercises.  Yoga.  Meditating.  Doing a body scan. To do this, close your eyes, focus on one area of your body at a time from head to toe, and notice which parts of your body are tense. Try to relax the muscles in those areas.  Download or buy apps on your mobile phone or tablet that can help you stick to your quit plan. There are many free apps, such as QuitGuide from the CDC (Centers for Disease Control and Prevention). You can find more support from smokefree.gov and other websites. This information is not intended to replace advice given to you by your health care provider. Make sure you discuss any questions you have with your health care provider. Document Released: 05/14/2009 Document Revised: 03/15/2016 Document   Reviewed: 12/02/2014 Elsevier Interactive Patient Education  2017 Elsevier Inc.  

## 2016-08-10 LAB — GC/CHLAMYDIA PROBE AMP (~~LOC~~) NOT AT ARMC
Chlamydia: NEGATIVE
NEISSERIA GONORRHEA: NEGATIVE

## 2016-08-10 NOTE — Addendum Note (Signed)
Addended by: Natale MilchSTALLING, BRITTANY D on: 08/10/2016 08:35 AM   Modules accepted: Orders

## 2016-08-10 NOTE — Addendum Note (Signed)
Addended by: Natale MilchSTALLING, Dezmond Downie D on: 08/10/2016 09:36 AM   Modules accepted: Orders

## 2016-08-11 LAB — CERVICOVAGINAL ANCILLARY ONLY
Bacterial vaginitis: POSITIVE — AB
CANDIDA VAGINITIS: NEGATIVE
Trichomonas: NEGATIVE

## 2016-08-12 ENCOUNTER — Other Ambulatory Visit: Payer: Self-pay

## 2016-08-12 DIAGNOSIS — B9689 Other specified bacterial agents as the cause of diseases classified elsewhere: Secondary | ICD-10-CM

## 2016-08-12 DIAGNOSIS — N760 Acute vaginitis: Principal | ICD-10-CM

## 2016-08-12 LAB — CYTOLOGY - PAP
Diagnosis: UNDETERMINED — AB
HPV: NOT DETECTED

## 2016-08-12 MED ORDER — METRONIDAZOLE 500 MG PO TABS: 500.0000 mg | ORAL_TABLET | Freq: Two times a day (BID) | ORAL | 0 refills | Status: AC

## 2016-09-15 ENCOUNTER — Telehealth: Payer: Self-pay

## 2016-09-15 DIAGNOSIS — A499 Bacterial infection, unspecified: Secondary | ICD-10-CM

## 2016-09-15 DIAGNOSIS — B379 Candidiasis, unspecified: Secondary | ICD-10-CM

## 2016-09-15 MED ORDER — FLUCONAZOLE 150 MG PO TABS: 150.0000 mg | ORAL_TABLET | Freq: Once | ORAL | 0 refills | Status: AC

## 2016-09-15 MED ORDER — METRONIDAZOLE 500 MG PO TABS: 500.0000 mg | ORAL_TABLET | Freq: Two times a day (BID) | ORAL | 0 refills | Status: DC

## 2016-09-15 NOTE — Telephone Encounter (Signed)
Returned call, no answer, left vm 

## 2016-09-15 NOTE — Telephone Encounter (Signed)
Patient called stating that she is having smelly discharge again and thinks its another bacterial infection, patient requested rx, sent per provider approval.

## 2016-11-02 ENCOUNTER — Telehealth (HOSPITAL_COMMUNITY): Payer: Self-pay | Admitting: *Deleted

## 2016-11-02 NOTE — Telephone Encounter (Signed)
Patient called in yesterday reporting that she is having brown/yellow discoloring around her mouth and eyes that continues to keep getting darker.  Patient stated she has recently lost her medicaid and doesn't have a PCP.  Patient stated she has a Child psychotherapist that has not been able to help her.  She stated she didn't know where else to turn but to [place].  I spoke to [place] our SW and she said she would reach out to patient for more information.

## 2016-11-10 IMAGING — US US FETAL BPP W/O NONSTRESS
1 series · 13 of 13 positions shown · non-contrast
Comparison: none

[Series 1: us fetal bpp w/o nonstress · non-contrast · 13 acquisitions, 13 frames shown]
[im 1/13]
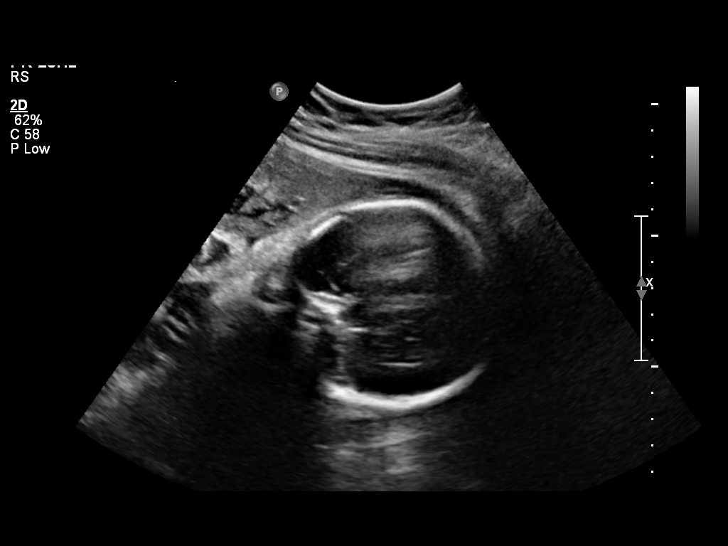
[im 2/13]
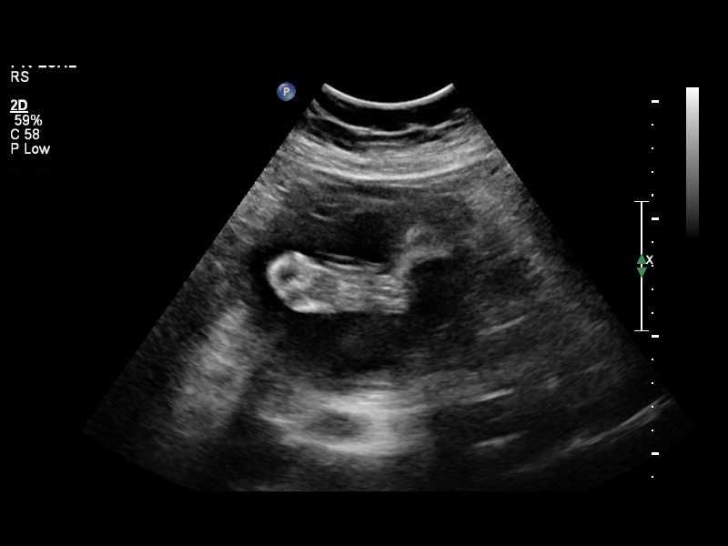
[im 3/13]
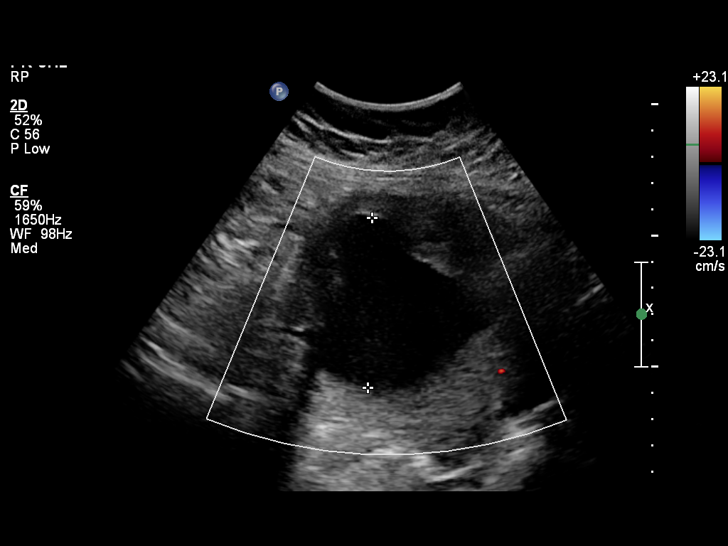
[im 4/13]
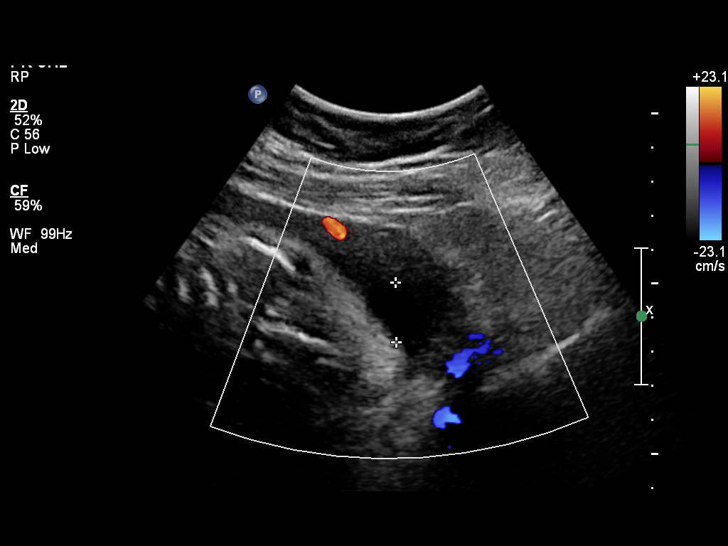
[im 5/13]
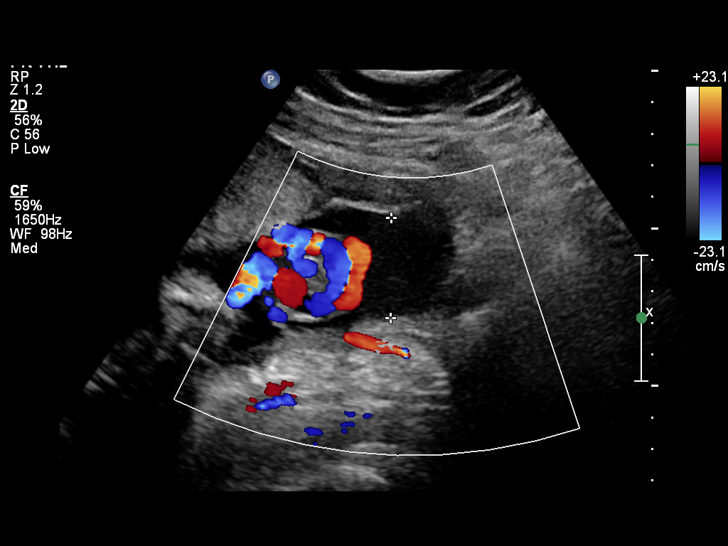
[im 6/13]
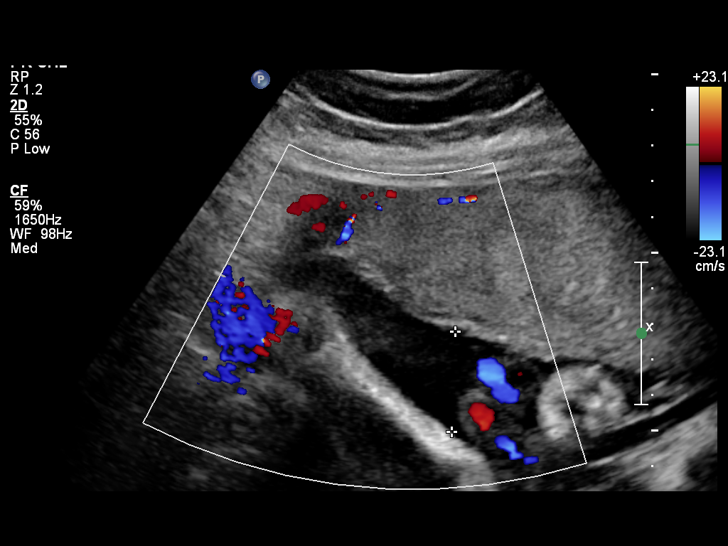
[im 7/13]
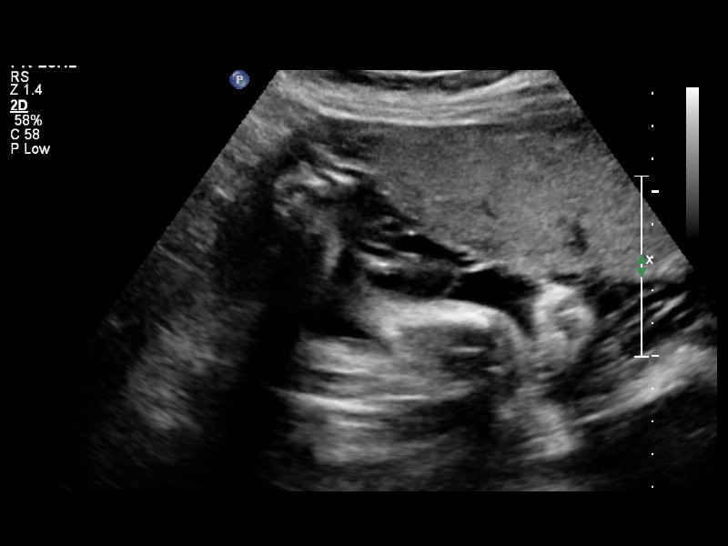
[im 8/13]
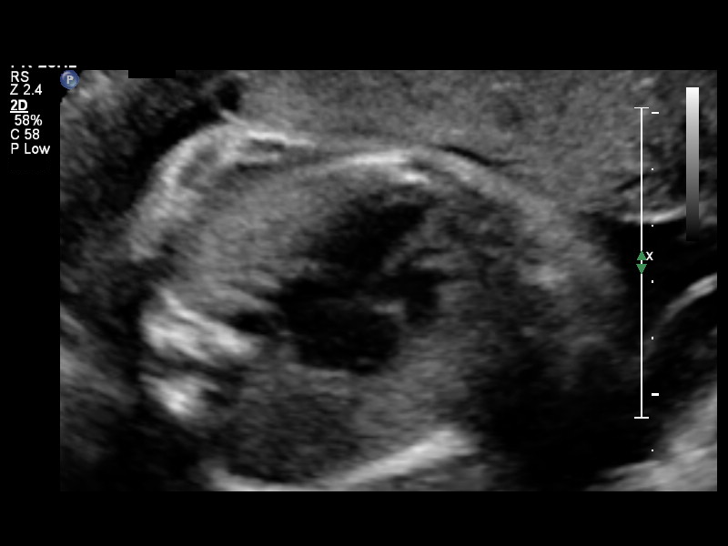
[im 9/13]
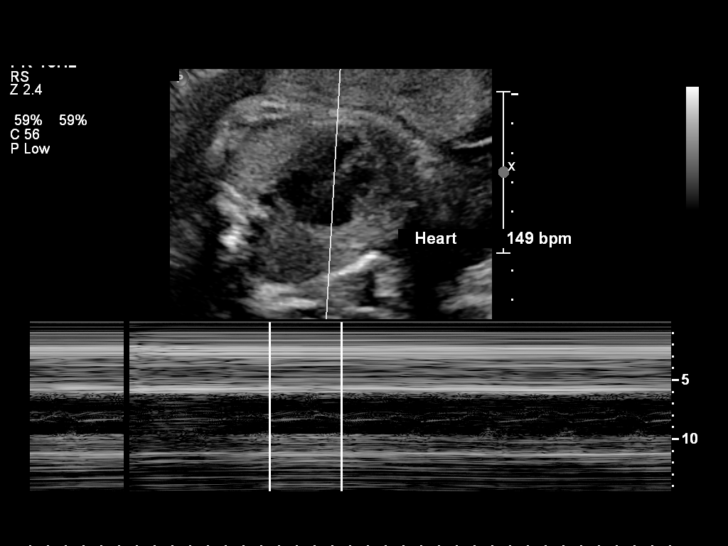
[im 10/13]
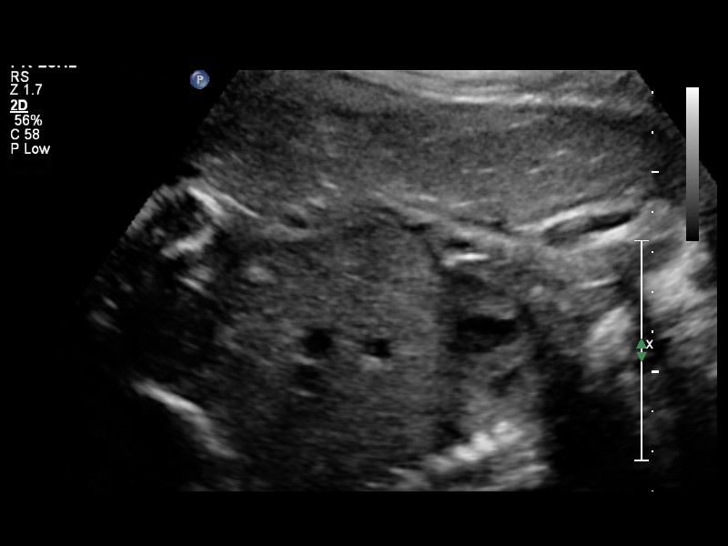
[im 11/13]
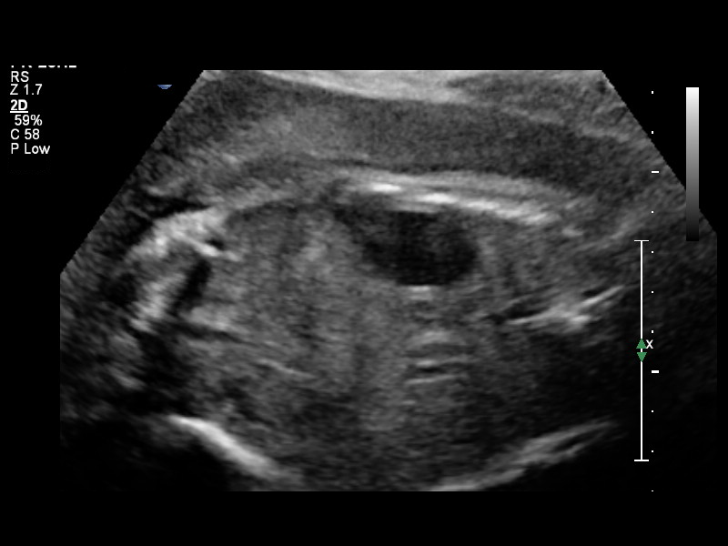
[im 12/13]
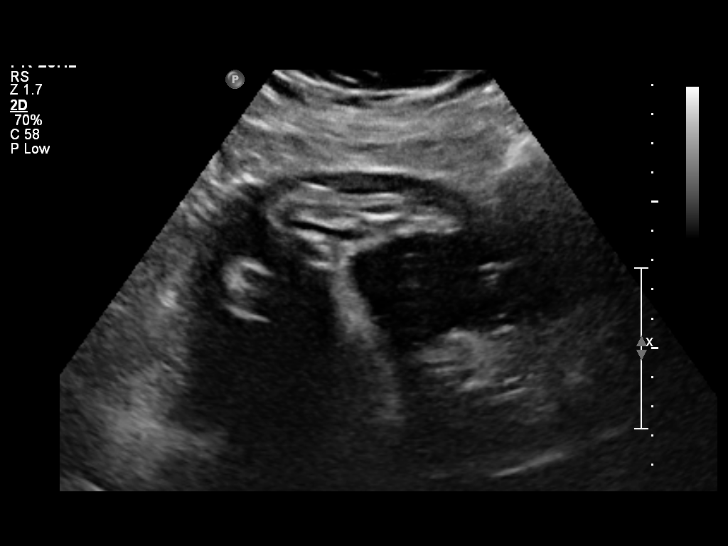
[im 13/13]
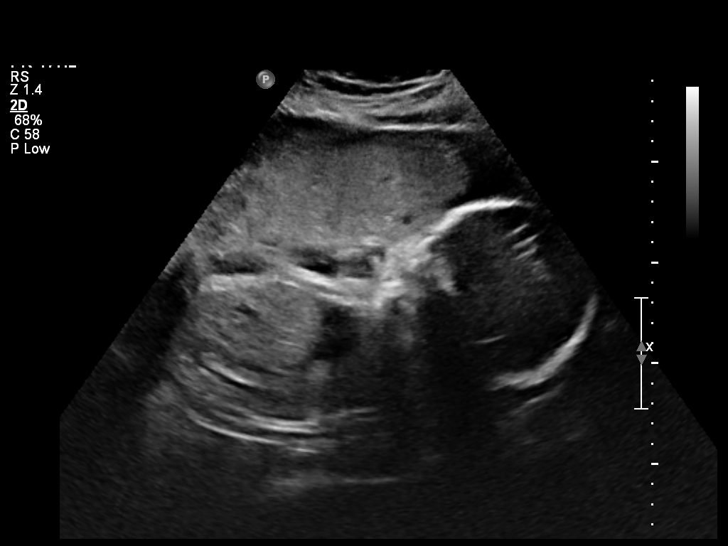

[13 of 13 positions shown; findings below may reference images not displayed]

OBSTETRICS REPORT
                      (Signed Final 09/26/2014 [DATE])

Service(s) Provided

Indications

 29 weeks gestation of pregnancy
 Non-reactive NST
 Cigarette smoker
Fetal Evaluation

 Num Of Fetuses:    1
 Fetal Heart Rate:  149                          bpm
 Cardiac Activity:  Observed
 Presentation:      Cephalic

 Comment:    BPP [DATE] in 20 mins

 Amniotic Fluid
 AFI FV:      Subjectively within normal limits
 AFI Sum:     14.17   cm       47  %Tile     Larg Pckt:    6.45  cm
 RUQ:   6.45    cm   RLQ:    1.75   cm    LUQ:   2.8     cm   LLQ:    3.17   cm
Biophysical Evaluation

 Amniotic F.V:   Pocket => 2 cm two         F. Tone:        Observed
                 planes
 F. Movement:    Observed                   Score:          [DATE]
 F. Breathing:   Observed
Gestational Age

 LMP:           29w 3d        Date:  03/04/14                 EDD:   12/09/14
 Best:          29w 3d     Det. By:  LMP  (03/04/14)          EDD:   12/09/14
Impression

 Single living intrauterine pregnancy at 29 weeks 3 days.
 Normal amniotic fluid volume.
 BPP [DATE].
Recommendations

 Follow-up ultrasounds as clinically indicated.
 questions or concerns.
                Madalin, Beci

## 2016-11-16 ENCOUNTER — Ambulatory Visit: Payer: Medicaid Other | Admitting: Obstetrics & Gynecology

## 2016-11-22 ENCOUNTER — Telehealth: Payer: Self-pay | Admitting: Licensed Clinical Social Worker

## 2016-11-22 NOTE — Telephone Encounter (Signed)
CSW referred to assist patient with resources for access to healthcare and PCP. Patient reports she is working and reports she has already been denied medicaid because she is not disable. Patient needs continued medical follow up and limited because of no insurance. CSW referred for the [facility] card. Patient states she will follow up and denied any other needs at this time. Lasandra Beech, LCSW, CCSW-MCS [phone]

## 2016-12-04 IMAGING — CR DG CHEST 1V PORT
1 series · 1 of 1 positions shown · non-contrast
Comparison: None.

CLINICAL DATA: Acute onset of severe dyspnea.  Initial encounter.

EXAM:
PORTABLE CHEST - 1 VIEW

[view not recorded]
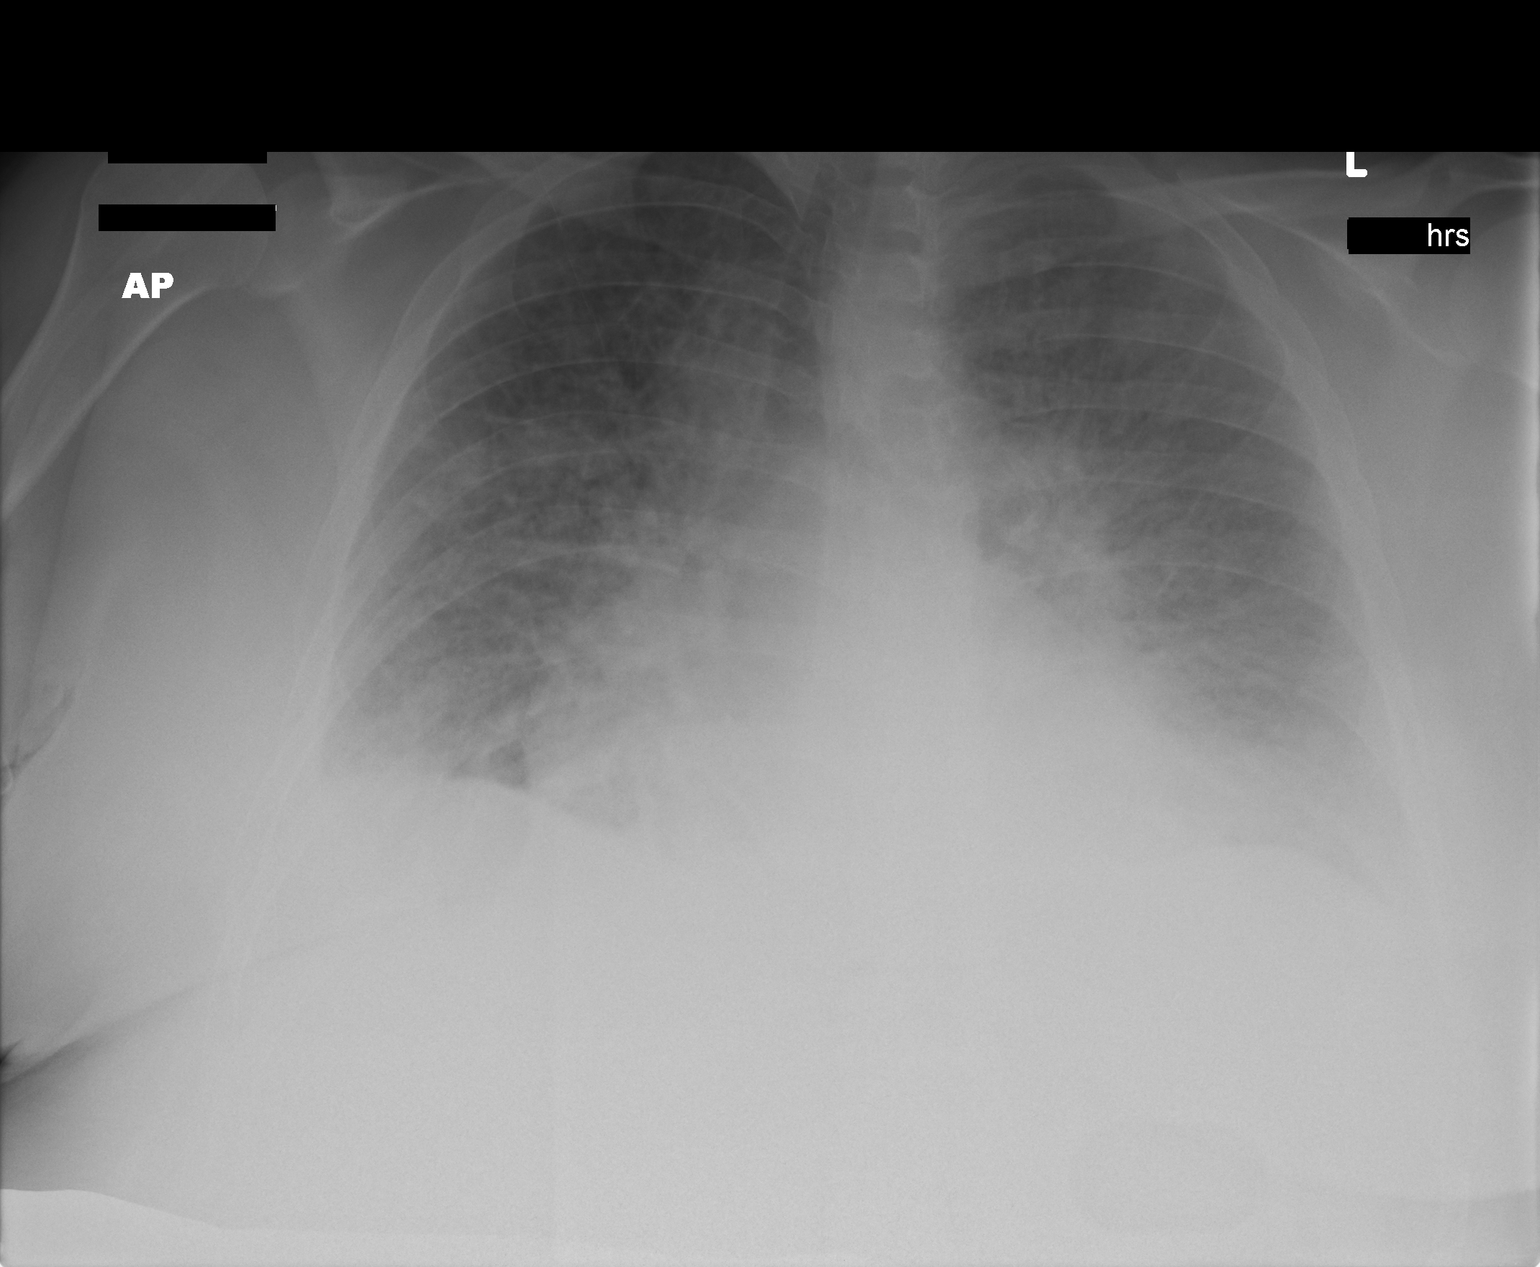

[1 of 1 positions shown; findings below may reference images not displayed]

FINDINGS: The lungs are well-aerated. Vascular congestion is noted, with
diffusely increased interstitial markings and a small right pleural
effusion, likely reflecting pulmonary edema. No pneumothorax is
seen.

The cardiomediastinal silhouette is mildly enlarged. No acute
osseous abnormalities are seen.
IMPRESSION: Vascular congestion and mild cardiomegaly, with diffusely increased
interstitial markings and a small right pleural effusion, likely
reflecting pulmonary edema.

## 2016-12-05 IMAGING — CR DG CHEST 1V PORT
1 series · 1 of 1 positions shown · non-contrast
Comparison: 10/20/2014

CLINICAL DATA: Respiratory failure.  Smoker.

EXAM:
PORTABLE CHEST - 1 VIEW

[view not recorded]
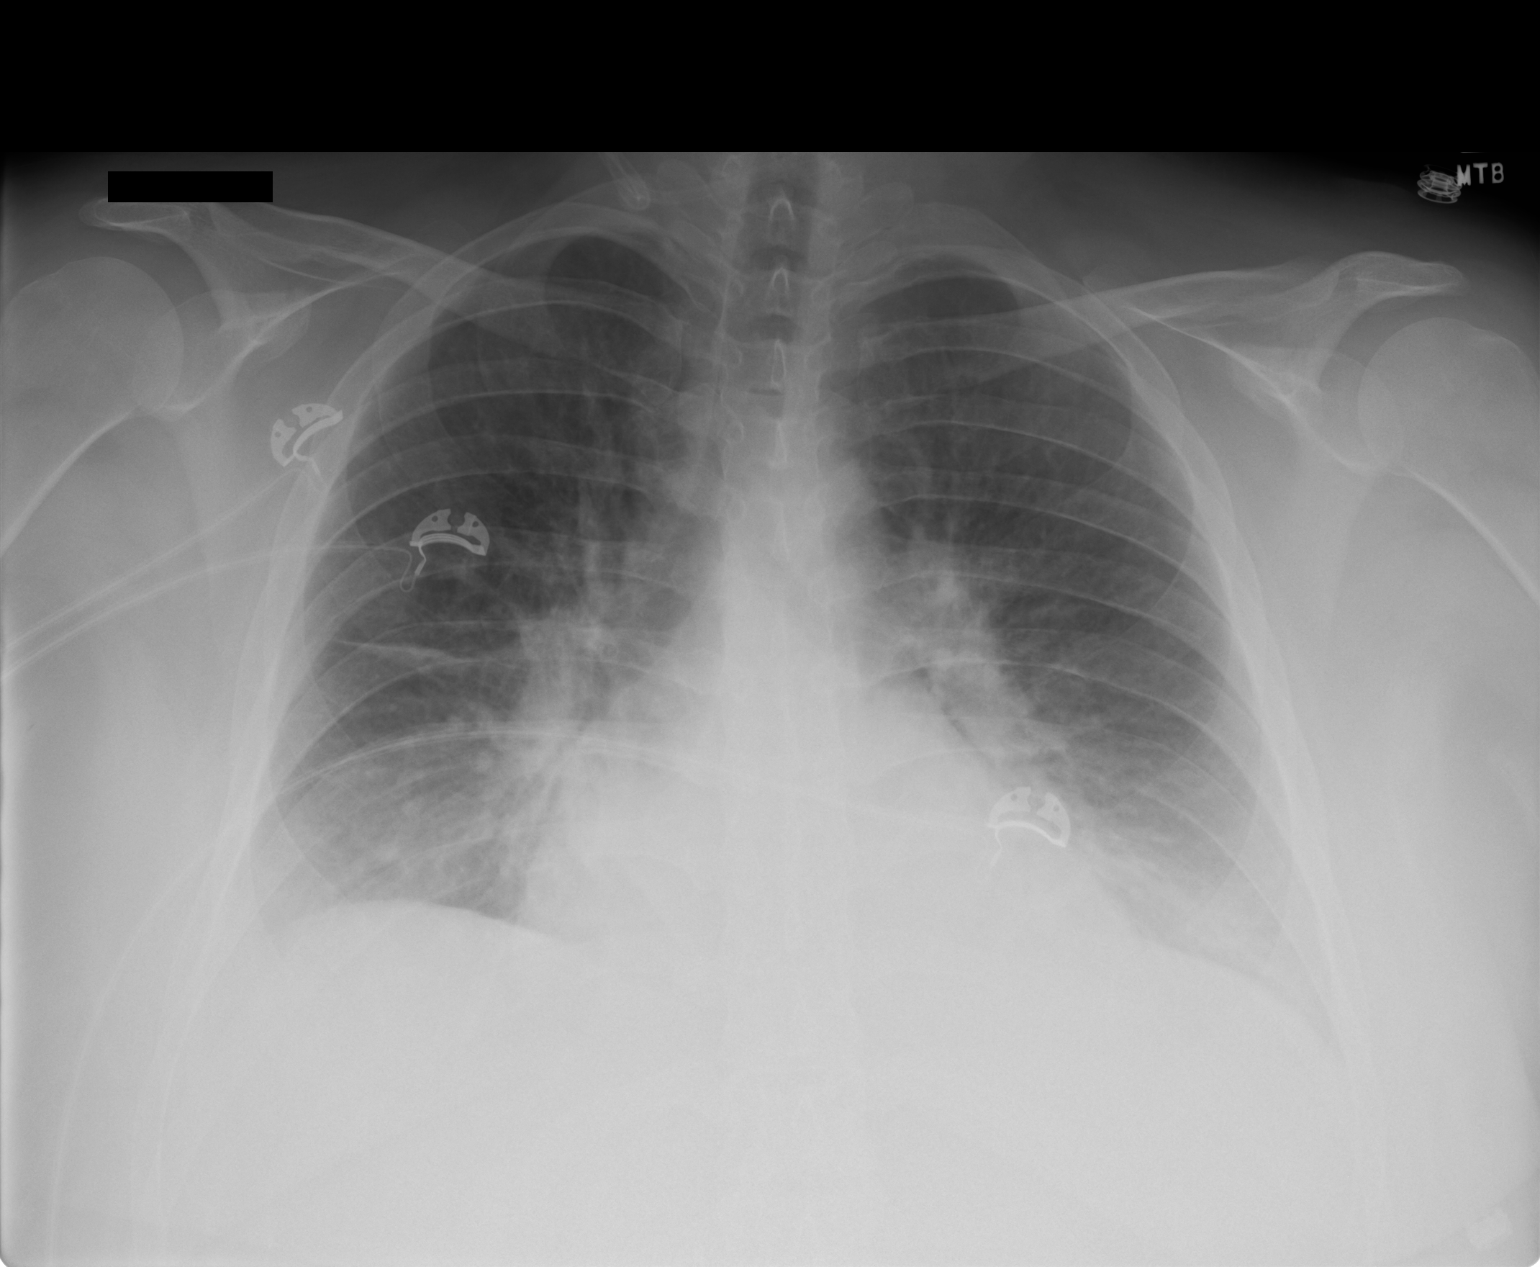

[1 of 1 positions shown; findings below may reference images not displayed]

FINDINGS: Lungs are adequately inflated demonstrate mild interval improvement
in hilar vasculature with associated hazy perihilar opacification
compatible improving interstitial edema. No definite effusions. Mild
stable cardiomegaly. Remainder of the exam is unchanged.
IMPRESSION: Findings compatible with improving interstitial edema. Mild stable
cardiomegaly.

## 2017-01-02 ENCOUNTER — Ambulatory Visit: Payer: Medicaid Other | Admitting: Obstetrics & Gynecology

## 2017-04-26 ENCOUNTER — Emergency Department (HOSPITAL_COMMUNITY): Payer: Self-pay

## 2017-04-26 ENCOUNTER — Emergency Department (HOSPITAL_COMMUNITY)
Admission: EM | Admit: 2017-04-26 | Discharge: 2017-04-26 | Disposition: A | Discharge status: Home / Self Care | Payer: Self-pay | Attending: Emergency Medicine | Admitting: Emergency Medicine

## 2017-04-26 ENCOUNTER — Encounter (HOSPITAL_COMMUNITY): Payer: Self-pay | Admitting: Emergency Medicine

## 2017-04-26 DIAGNOSIS — R079 Chest pain, unspecified: Secondary | ICD-10-CM | POA: Insufficient documentation

## 2017-04-26 DIAGNOSIS — Z5321 Procedure and treatment not carried out due to patient leaving prior to being seen by health care provider: Secondary | ICD-10-CM | POA: Insufficient documentation

## 2017-04-26 LAB — CBC
HEMATOCRIT: 42.8 % (ref 36.0–46.0)
HEMOGLOBIN: 14.8 g/dL (ref 12.0–15.0)
MCH: 29.3 pg (ref 26.0–34.0)
MCHC: 34.6 g/dL (ref 30.0–36.0)
MCV: 84.8 fL (ref 78.0–100.0)
PLATELETS: 242 10*3/uL (ref 150–400)
RBC: 5.05 MIL/uL (ref 3.87–5.11)
RDW: 12.8 % (ref 11.5–15.5)
WBC: 10.1 10*3/uL (ref 4.0–10.5)

## 2017-04-26 LAB — BASIC METABOLIC PANEL
ANION GAP: 6 (ref 5–15)
BUN: 8 mg/dL (ref 6–20)
CALCIUM: 10 mg/dL (ref 8.9–10.3)
CHLORIDE: 108 mmol/L (ref 101–111)
CO2: 21 mmol/L — AB (ref 22–32)
Creatinine, Ser: 0.56 mg/dL (ref 0.44–1.00)
GFR calc Af Amer: >60 mL/min (ref >60)
GFR calc non Af Amer: >60 mL/min (ref >60)
GLUCOSE: 82 mg/dL (ref 65–99)
POTASSIUM: 3.8 mmol/L (ref 3.5–5.1)
Sodium: 135 mmol/L (ref 135–145)

## 2017-04-26 LAB — I-STAT TROPONIN, ED: TROPONIN I, POC: 0.01 ng/mL (ref 0.00–0.08)

## 2017-04-26 NOTE — ED Triage Notes (Signed)
Pt reports central CP starting on waking this am, pt states, "I was have CHF from when I was pregnant with my son." Pt reports n/v with CP, denies dizziness, SOB.  Resp e/u.

## 2017-04-26 NOTE — ED Notes (Signed)
No response in lobby when called for room. 

## 2018-02-07 ENCOUNTER — Emergency Department (HOSPITAL_COMMUNITY): Payer: Self-pay

## 2018-02-07 ENCOUNTER — Emergency Department (HOSPITAL_COMMUNITY)
Admission: EM | Admit: 2018-02-07 | Discharge: 2018-02-07 | Disposition: A | Discharge status: Home / Self Care | Payer: Self-pay | Attending: Emergency Medicine | Admitting: Emergency Medicine

## 2018-02-07 ENCOUNTER — Encounter (HOSPITAL_COMMUNITY): Payer: Self-pay | Admitting: Emergency Medicine

## 2018-02-07 DIAGNOSIS — R109 Unspecified abdominal pain: Secondary | ICD-10-CM

## 2018-02-07 DIAGNOSIS — Z79899 Other long term (current) drug therapy: Secondary | ICD-10-CM | POA: Insufficient documentation

## 2018-02-07 DIAGNOSIS — I5020 Unspecified systolic (congestive) heart failure: Secondary | ICD-10-CM | POA: Insufficient documentation

## 2018-02-07 DIAGNOSIS — R1010 Upper abdominal pain, unspecified: Secondary | ICD-10-CM | POA: Insufficient documentation

## 2018-02-07 DIAGNOSIS — I110 Hypertensive heart disease with heart failure: Secondary | ICD-10-CM | POA: Insufficient documentation

## 2018-02-07 DIAGNOSIS — R110 Nausea: Secondary | ICD-10-CM | POA: Insufficient documentation

## 2018-02-07 DIAGNOSIS — F17210 Nicotine dependence, cigarettes, uncomplicated: Secondary | ICD-10-CM | POA: Insufficient documentation

## 2018-02-07 LAB — COMPREHENSIVE METABOLIC PANEL
ALT: 15 U/L (ref 0–44)
AST: 15 U/L (ref 15–41)
Albumin: 4 g/dL (ref 3.5–5.0)
Alkaline Phosphatase: 56 U/L (ref 38–126)
Anion gap: 9 (ref 5–15)
BUN: 6 mg/dL (ref 6–20)
CHLORIDE: 106 mmol/L (ref 98–111)
CO2: 25 mmol/L (ref 22–32)
CREATININE: 0.65 mg/dL (ref 0.44–1.00)
Calcium: 10 mg/dL (ref 8.9–10.3)
Glucose, Bld: 112 mg/dL — ABNORMAL HIGH (ref 70–99)
Potassium: 3.2 mmol/L — ABNORMAL LOW (ref 3.5–5.1)
SODIUM: 140 mmol/L (ref 135–145)
Total Bilirubin: 0.8 mg/dL (ref 0.3–1.2)
Total Protein: 6 g/dL — ABNORMAL LOW (ref 6.5–8.1)

## 2018-02-07 LAB — URINALYSIS, ROUTINE W REFLEX MICROSCOPIC
Bilirubin Urine: NEGATIVE
GLUCOSE, UA: NEGATIVE mg/dL
HGB URINE DIPSTICK: NEGATIVE
Ketones, ur: NEGATIVE mg/dL
LEUKOCYTES UA: NEGATIVE
Nitrite: NEGATIVE
PROTEIN: NEGATIVE mg/dL
Specific Gravity, Urine: 1.005 (ref 1.005–1.030)
pH: 7 (ref 5.0–8.0)

## 2018-02-07 LAB — CBC
HEMATOCRIT: 41.7 % (ref 36.0–46.0)
HEMOGLOBIN: 14.1 g/dL (ref 12.0–15.0)
MCH: 29.4 pg (ref 26.0–34.0)
MCHC: 33.8 g/dL (ref 30.0–36.0)
MCV: 87.1 fL (ref 78.0–100.0)
PLATELETS: 224 10*3/uL (ref 150–400)
RBC: 4.79 MIL/uL (ref 3.87–5.11)
RDW: 12.5 % (ref 11.5–15.5)
WBC: 10.6 10*3/uL — AB (ref 4.0–10.5)

## 2018-02-07 LAB — LIPASE, BLOOD: LIPASE: 29 U/L (ref 11–51)

## 2018-02-07 LAB — I-STAT BETA HCG BLOOD, ED (MC, WL, AP ONLY): I-stat hCG, quantitative: <5 m[IU]/mL (ref <5)

## 2018-02-07 MED ORDER — ONDANSETRON HCL 4 MG/2ML IJ SOLN
4.0000 mg | Freq: Once | INTRAMUSCULAR | Status: AC
  Administered 2018-02-07: 4 mg via INTRAVENOUS
  Filled 2018-02-07: qty 2

## 2018-02-07 MED ORDER — SODIUM CHLORIDE 0.9 % IV SOLN
1000.0000 mL | INTRAVENOUS | Status: DC
  Administered 2018-02-07: 1000 mL via INTRAVENOUS

## 2018-02-07 MED ORDER — SODIUM CHLORIDE 0.9 % IV BOLUS (SEPSIS)
1000.0000 mL | Freq: Once | INTRAVENOUS | Status: AC
  Administered 2018-02-07: 1000 mL via INTRAVENOUS

## 2018-02-07 MED ORDER — ONDANSETRON 4 MG PO TBDP: 4.0000 mg | ORAL_TABLET | Freq: Three times a day (TID) | ORAL | 0 refills | Status: DC | PRN

## 2018-02-07 NOTE — ED Provider Notes (Signed)
Lauren Sharp [facility] EMERGENCY DEPARTMENT Provider Note   CSN: [id number] Arrival date & time: 02/07/18  0750     History   Chief Complaint Chief Complaint  Patient presents with  . Nausea    HPI Lauren Sharp is a 27 y.o. female.  No language interpreter was used.  Emesis   This is a new problem. The current episode started more than 2 days ago. The problem occurs 5 to 10 times per day. The problem has not changed since onset.There has been no fever. Pertinent negatives include no chills, no cough and no URI.  Pt complains of nausea and vomiting. Pt reports she has had nausea for the past 3 days.  Pt reports difficulty drinking fluids.  Pt reports no vaginal discharge no std risk.  Pt complains of discomfort in upper abdomen  Past Medical History:  Diagnosis Date  . Bacterial vaginosis   . Gonorrhea   . Hypertension   . Psoriasis   . Vaginal Pap smear, abnormal     Patient Active Problem List   Diagnosis Date Noted  . Vaginal discharge 08/09/2016  . IUD contraception 08/09/2016  . Depression (emotion) 11/18/2014  . Smoking 10/31/2014  . Acute systolic CHF (congestive heart failure) (HCC) 10/27/2014  . 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 Hilar adenopathy 10/27/2014  . H/O cesarean section 10/21/2014    Past Surgical History:  Procedure Laterality Date  . CESAREAN SECTION N/A 10/21/2014   Procedure: CESAREAN SECTION;  Surgeon: Tracey Harries, MD;  Location: [facility];  Service: Obstetrics;  Laterality: N/A;  . NO PAST SURGERIES    . TOOTH EXTRACTION       OB History    Gravida  1   Para  1   Term      Preterm  1   AB      Living  1     SAB      TAB      Ectopic      Multiple  0   Live Births  1            Home Medications    Prior to Admission medications   Medication Sig Start Date End Date Taking? Authorizing Provider  calcium carbonate (TUMS EX) 750 MG chewable tablet Chew 1 tablet by mouth as needed for  heartburn.   Yes [provider]  FLUoxetine (PROZAC) 10 MG capsule Take 10 mg by mouth daily.   Yes [provider]  ibuprofen (ADVIL,MOTRIN) 200 MG tablet Take 400 mg by mouth every 6 (six) hours as needed (pain).    Yes [provider]  medroxyPROGESTERone (DEPO-PROVERA) 150 MG/ML injection Inject 150 mg into the muscle every 3 (three) months.   Yes [provider]  naproxen sodium (ALEVE) 220 MG tablet Take 440 mg by mouth as needed (pain).   Yes [provider]  polyethylene glycol (MIRALAX / GLYCOLAX) packet Take 17 g by mouth daily as needed for mild constipation.   Yes [provider]  ondansetron (ZOFRAN ODT) 4 MG disintegrating tablet Take 1 tablet (4 mg total) by mouth every 8 (eight) hours as needed for nausea or vomiting. 02/07/18   Elson Areas, PA-C    Family History Family History  Problem Relation Age of Onset  . Arthritis Mother   . Depression Mother   . Hyperlipidemia Mother   . Learning disabilities Mother   . Mental illness Mother   . Varicose Veins Mother   . Alcohol abuse  Father   . Arthritis Father   . Asthma Father   . Cancer Father   . Drug abuse Father   . Diabetes Father   . Early death Father   . Heart disease Father   . Learning disabilities Sister   . Hyperlipidemia Maternal Grandmother   . Hyperlipidemia Maternal Grandfather     Social History Social History   Tobacco Use  . Smoking status: Current Every Day Smoker    Packs/day: 1.00    Years: 10.00    Pack years: 10.00    Types: Cigarettes    Last attempt to quit: 10/17/2014    Years since quitting: 3.3  . Smokeless tobacco: Never Used  Substance Use Topics  . Alcohol use: Yes    Alcohol/week: 0.0 oz    Comment: Rare  . Drug use: No     Allergies   Patient has no known allergies.   Review of Systems Review of Systems  Constitutional: Negative for chills.  Respiratory: Negative for cough.   Gastrointestinal: Positive for  vomiting.  All other systems reviewed and are negative.    Physical Exam Updated Vital Signs BP (!) 143/74   Pulse 74   Temp 98.4 F (36.9 C) (Oral)   Resp 16   Ht 5\' 4"  (1.626 m)   Wt 85.7 kg (189 lb)   SpO2 96%   BMI 32.44 kg/m   Physical Exam  Constitutional: She appears well-developed and well-nourished.  HENT:  Head: Normocephalic.  Right Ear: External ear normal.  Eyes: Pupils are equal, round, and reactive to light. EOM are normal.  Neck: Normal range of motion.  Cardiovascular: Normal rate.  Pulmonary/Chest: Effort normal.  Musculoskeletal: Normal range of motion.  Neurological: She is alert.  Skin: Skin is warm.  Psychiatric: She has a normal mood and affect.  Nursing note and vitals reviewed.    ED Treatments / Results  Labs (all labs ordered are listed, but only abnormal results are displayed) Labs Reviewed  COMPREHENSIVE METABOLIC PANEL - Abnormal; Notable for the following components:      Result Value   Potassium 3.2 (*)    Glucose, Bld 112 (*)    Total Protein 6.0 (*)    All other components within normal limits  CBC - Abnormal; Notable for the following components:   WBC 10.6 (*)    All other components within normal limits  LIPASE, BLOOD  URINALYSIS, ROUTINE W REFLEX MICROSCOPIC  I-STAT BETA HCG BLOOD, ED ([facility], [facility], [facility] ONLY)    EKG None  Radiology US Abdomen Complete  Result Date: 02/07/2018 CLINICAL DATA:  Three days of upper abdominal discomfort associated with nausea and vomiting. EXAM: ABDOMEN ULTRASOUND COMPLETE COMPARISON:  None in PACs FINDINGS: Gallbladder: The gallbladder is adequately distended. No stones or sludge are observed. There is no gallbladder wall thickening, pericholecystic fluid, or positive sonographic Murphy's sign. Common bile duct: Diameter: 3.7 mm Liver: No focal lesion identified. Within normal limits in parenchymal echogenicity. Portal vein is patent on color Doppler imaging with normal direction of blood flow  towards the liver. IVC: No abnormality visualized. Pancreas: Visualized portion unremarkable. Spleen: The spleen measures 12 cm in length. Its echotexture is normal. Right Kidney: Length: 11.5 cm. Echogenicity within normal limits. No mass or hydronephrosis visualized. Left Kidney: Length: 12.2 cm. Echogenicity within normal limits. No mass or hydronephrosis visualized. Abdominal aorta: No aneurysm visualized. Other findings: There is no ascites. IMPRESSION: No gallstones or sonographic evidence of acute cholecystitis. If there are clinical concerns  of chronic cholecystitis, a nuclear medicine hepatobiliary scan with gallbladder ejection fraction determination may be useful. No acute visceral abnormality observed elsewhere in the abdomen. The spleen is top-normal in size measuring 12 cm in length. Electronically Signed   By: David  Swazil[place]Jordan M.D.   On: 02/07/2018 11:35    Procedures Procedures (including critical care time)  Medications Ordered in ED Medications  sodium chloride 0.9 % bolus 1,000 mL (0 mLs Intravenous Stopped 02/07/18 0930)    Followed by  0.9 %  sodium chloride infusion (0 mLs Intravenous Stopped 02/07/18 1249)  ondansetron (ZOFRAN) injection 4 mg (4 mg Intravenous Given 02/07/18 0858)     Initial Impression / Assessment and Plan / ED Course  I have reviewed the triage vital signs and the nursing notes.  Pertinent labs & imaging results that were available during my care of the patient were reviewed by me and considered in my medical decision making (see chart for details).     Ultrasound no gallstones.  No abnormality,  Pt given Iv fluids, zofran Iv.  Pt feels better.  Pt able to drink oral fluids.   Final Clinical Impressions(s) / ED Diagnoses   Final diagnoses:  Nausea  Abdominal pain, unspecified abdominal location    ED Discharge Orders        Ordered    ondansetron (ZOFRAN ODT) 4 MG disintegrating tablet  Every 8 hours PRN     02/07/18 1250    An After Visit  Summary was printed and given to the patient.   Elson AreasSofia, Eithan Beagle K, PA-C 02/07/18 1309    Benjiman CorePickering, Nathan, MD 02/07/18 [id number]

## 2018-02-07 NOTE — ED Notes (Signed)
Patient still in US at this time.

## 2018-02-07 NOTE — Discharge Instructions (Signed)
Return if any problems.

## 2018-02-07 NOTE — ED Triage Notes (Signed)
Pt has been feeling nauseous over the past 3 days, some vomiting. Denies abd pain, diarrhea. Feeling hot/cold.

## 2018-02-07 NOTE — ED Notes (Signed)
Patient transported to Ultrasound 

## 2018-02-09 ENCOUNTER — Emergency Department (HOSPITAL_COMMUNITY)
Admission: EM | Admit: 2018-02-09 | Discharge: 2018-02-09 | Disposition: A | Discharge status: Home / Self Care | Payer: Medicaid Other | Attending: Emergency Medicine | Admitting: Emergency Medicine

## 2018-02-09 ENCOUNTER — Other Ambulatory Visit: Payer: Self-pay

## 2018-02-09 ENCOUNTER — Encounter (HOSPITAL_COMMUNITY): Payer: Self-pay | Admitting: Emergency Medicine

## 2018-02-09 DIAGNOSIS — R531 Weakness: Secondary | ICD-10-CM | POA: Insufficient documentation

## 2018-02-09 DIAGNOSIS — I110 Hypertensive heart disease with heart failure: Secondary | ICD-10-CM | POA: Insufficient documentation

## 2018-02-09 DIAGNOSIS — R112 Nausea with vomiting, unspecified: Secondary | ICD-10-CM | POA: Insufficient documentation

## 2018-02-09 DIAGNOSIS — I5020 Unspecified systolic (congestive) heart failure: Secondary | ICD-10-CM | POA: Insufficient documentation

## 2018-02-09 DIAGNOSIS — R110 Nausea: Secondary | ICD-10-CM

## 2018-02-09 DIAGNOSIS — Z79899 Other long term (current) drug therapy: Secondary | ICD-10-CM | POA: Insufficient documentation

## 2018-02-09 DIAGNOSIS — F17210 Nicotine dependence, cigarettes, uncomplicated: Secondary | ICD-10-CM | POA: Insufficient documentation

## 2018-02-09 LAB — CBC
HCT: 40 % (ref 36.0–46.0)
Hemoglobin: 13.6 g/dL (ref 12.0–15.0)
MCH: 29.3 pg (ref 26.0–34.0)
MCHC: 34 g/dL (ref 30.0–36.0)
MCV: 86.2 fL (ref 78.0–100.0)
Platelets: 212 10*3/uL (ref 150–400)
RBC: 4.64 MIL/uL (ref 3.87–5.11)
RDW: 12.5 % (ref 11.5–15.5)
WBC: 9.6 10*3/uL (ref 4.0–10.5)

## 2018-02-09 LAB — COMPREHENSIVE METABOLIC PANEL
ALBUMIN: 3.9 g/dL (ref 3.5–5.0)
ALK PHOS: 52 U/L (ref 38–126)
ALT: 14 U/L (ref 0–44)
AST: 16 U/L (ref 15–41)
Anion gap: 8 (ref 5–15)
BILIRUBIN TOTAL: 0.9 mg/dL (ref 0.3–1.2)
BUN: 5 mg/dL — ABNORMAL LOW (ref 6–20)
CALCIUM: 10.1 mg/dL (ref 8.9–10.3)
CO2: 23 mmol/L (ref 22–32)
Chloride: 112 mmol/L — ABNORMAL HIGH (ref 98–111)
Creatinine, Ser: 0.62 mg/dL (ref 0.44–1.00)
GFR calc Af Amer: >60 mL/min (ref >60)
GFR calc non Af Amer: >60 mL/min (ref >60)
GLUCOSE: 107 mg/dL — AB (ref 70–99)
Potassium: 3.7 mmol/L (ref 3.5–5.1)
Sodium: 143 mmol/L (ref 135–145)
TOTAL PROTEIN: 6 g/dL — AB (ref 6.5–8.1)

## 2018-02-09 LAB — URINALYSIS, ROUTINE W REFLEX MICROSCOPIC
BILIRUBIN URINE: NEGATIVE
Glucose, UA: NEGATIVE mg/dL
HGB URINE DIPSTICK: NEGATIVE
Ketones, ur: NEGATIVE mg/dL
NITRITE: NEGATIVE
PH: 8 (ref 5.0–8.0)
Protein, ur: NEGATIVE mg/dL
Specific Gravity, Urine: 1.002 — ABNORMAL LOW (ref 1.005–1.030)

## 2018-02-09 LAB — I-STAT BETA HCG BLOOD, ED (MC, WL, AP ONLY): I-stat hCG, quantitative: <5 m[IU]/mL (ref <5)

## 2018-02-09 LAB — LIPASE, BLOOD: Lipase: 30 U/L (ref 11–51)

## 2018-02-09 LAB — MONONUCLEOSIS SCREEN: MONO SCREEN: NEGATIVE

## 2018-02-09 MED ORDER — PROMETHAZINE HCL 25 MG PO TABS: 25.0000 mg | ORAL_TABLET | Freq: Four times a day (QID) | ORAL | 0 refills | Status: DC | PRN

## 2018-02-09 MED ORDER — SODIUM CHLORIDE 0.9 % IV BOLUS
1000.0000 mL | Freq: Once | INTRAVENOUS | Status: AC
  Administered 2018-02-09: 1000 mL via INTRAVENOUS

## 2018-02-09 MED ORDER — KETOROLAC TROMETHAMINE 15 MG/ML IJ SOLN
15.0000 mg | Freq: Once | INTRAMUSCULAR | Status: AC
  Administered 2018-02-09: 15 mg via INTRAVENOUS
  Filled 2018-02-09: qty 1

## 2018-02-09 MED ORDER — ONDANSETRON HCL 4 MG/2ML IJ SOLN
4.0000 mg | Freq: Once | INTRAMUSCULAR | Status: AC
  Administered 2018-02-09: 4 mg via INTRAVENOUS
  Filled 2018-02-09: qty 2

## 2018-02-09 NOTE — ED Notes (Signed)
Pt discharged from ED; instructions provided and scripts given; Pt encouraged to return to ED if symptoms worsen and to f/u with PCP; Pt verbalized understanding of all instructions 

## 2018-02-09 NOTE — ED Provider Notes (Signed)
[facility] EMERGENCY DEPARTMENT Provider Note   CSN: [id number] Arrival date & time: 02/09/18  0827     History   Chief Complaint Chief Complaint  Patient presents with  . Nausea  . Fatigue    HPI Lauren Sharp is a 27 y.o. female.  27 year old female with prior medical history as detailed below presents with complaint of continued chills, nausea, vomiting, poor p.o. intake, fatigue, and malaise.  Patient reports symptoms have been ongoing for at least the last week.  She was seen on July 10 for same.  Work-up in the ED at that time was not demonstrative of significant acute pathology.  She reports that she has used Zofran intermittently at home with minimal relief of her nausea.  She returns today complaining of continued symptoms.  She denies fever.  She denies abdominal pain.  She denies chest pain or shortness of breath.  The history is provided by the patient and medical records.  Illness  This is a recurrent problem. The current episode started more than 1 week ago. The problem occurs constantly. The problem has not changed since onset.Pertinent negatives include no chest pain, no abdominal pain, no headaches and no shortness of breath. Nothing aggravates the symptoms. Nothing relieves the symptoms. She has tried rest for the symptoms. The treatment provided no relief.    Past Medical History:  Diagnosis Date  . Bacterial vaginosis   . Gonorrhea   . Hypertension   . Psoriasis   . Vaginal Pap smear, abnormal     Patient Active Problem List   Diagnosis Date Noted  . Vaginal discharge 08/09/2016  . IUD contraception 08/09/2016  . Depression (emotion) 11/18/2014  . Smoking 10/31/2014  . Acute systolic CHF (congestive heart failure) (HCC) 10/27/2014  . 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 Hilar adenopathy 10/27/2014  . H/O cesarean section 10/21/2014    Past Surgical History:  Procedure Laterality Date  . CESAREAN SECTION N/A  10/21/2014   Procedure: CESAREAN SECTION;  Surgeon: Tracey Harries, MD;  Location: [facility];  Service: Obstetrics;  Laterality: N/A;  . NO PAST SURGERIES    . TOOTH EXTRACTION       OB History    Gravida  1   Para  1   Term      Preterm  1   AB      Living  1     SAB      TAB      Ectopic      Multiple  0   Live Births  1            Home Medications    Prior to Admission medications   Medication Sig Start Date End Date Taking? Authorizing Provider  calcium carbonate (TUMS EX) 750 MG chewable tablet Chew 1 tablet by mouth as needed for heartburn.    [provider]  FLUoxetine (PROZAC) 10 MG capsule Take 10 mg by mouth daily.    [provider]  ibuprofen (ADVIL,MOTRIN) 200 MG tablet Take 400 mg by mouth every 6 (six) hours as needed (pain).     [provider]  medroxyPROGESTERone (DEPO-PROVERA) 150 MG/ML injection Inject 150 mg into the muscle every 3 (three) months.    [provider]  naproxen sodium (ALEVE) 220 MG tablet Take 440 mg by mouth as needed (pain).    [provider]  ondansetron (ZOFRAN ODT) 4 MG disintegrating tablet Take 1 tablet (4 mg total) by mouth every 8 (eight)  hours as needed for nausea or vomiting. 02/07/18   Elson Areas, PA-C  polyethylene glycol [facility] / GLYCOLAX) packet Take 17 g by mouth daily as needed for mild constipation.    [provider]    Family History Family History  Problem Relation Age of Onset  . Arthritis Mother   . Depression Mother   . Hyperlipidemia Mother   . Learning disabilities Mother   . Mental illness Mother   . Varicose Veins Mother   . Alcohol abuse Father   . Arthritis Father   . Asthma Father   . Cancer Father   . Drug abuse Father   . Diabetes Father   . Early death Father   . Heart disease Father   . Learning disabilities Sister   . Hyperlipidemia Maternal Grandmother   . Hyperlipidemia Maternal Grandfather     Social History Social  History   Tobacco Use  . Smoking status: Current Every Day Smoker    Packs/day: 1.00    Years: 10.00    Pack years: 10.00    Types: Cigarettes    Last attempt to quit: 10/17/2014    Years since quitting: 3.3  . Smokeless tobacco: Never Used  Substance Use Topics  . Alcohol use: Yes    Alcohol/week: 0.0 oz    Comment: Rare  . Drug use: No     Allergies   Patient has no known allergies.   Review of Systems Review of Systems  Constitutional: Positive for activity change, appetite change, chills and fatigue. Negative for fever.  Respiratory: Negative for shortness of breath.   Cardiovascular: Negative for chest pain.  Gastrointestinal: Negative for abdominal pain.  Neurological: Negative for headaches.  All other systems reviewed and are negative.    Physical Exam Updated Vital Signs BP (!) 142/95 (BP Location: Right Arm)   Pulse 62   Temp 99 F (37.2 C) (Oral)   Resp 18   SpO2 99%   Physical Exam  Constitutional: She is oriented to person, place, and time. She appears well-developed and well-nourished. No distress.  HENT:  Head: Normocephalic and atraumatic.  Mouth/Throat: Oropharynx is clear and moist.  Eyes: Pupils are equal, round, and reactive to light. Conjunctivae and EOM are normal.  Neck: Normal range of motion. Neck supple.  Cardiovascular: Normal rate, regular rhythm and normal heart sounds.  Pulmonary/Chest: Effort normal and breath sounds normal. No respiratory distress.  Abdominal: Soft. She exhibits no distension. There is no tenderness.  Musculoskeletal: Normal range of motion. She exhibits no edema or deformity.  Neurological: She is alert and oriented to person, place, and time.  Skin: Skin is warm and dry.  Psychiatric: She has a normal mood and affect.  Nursing note and vitals reviewed.    ED Treatments / Results  Labs (all labs ordered are listed, but only abnormal results are displayed) Labs Reviewed  URINALYSIS, ROUTINE W REFLEX  MICROSCOPIC - Abnormal; Notable for the following components:      Result Value   Color, Urine STRAW (*)    Specific Gravity, Urine 1.002 (*)    Leukocytes, UA TRACE (*)    Bacteria, UA RARE (*)    All other components within normal limits  CBC  LIPASE, BLOOD  COMPREHENSIVE METABOLIC PANEL  MONONUCLEOSIS SCREEN  I-STAT BETA HCG BLOOD, ED ([facility], [facility], [facility] ONLY)    EKG None  Radiology US Abdomen Complete  Result Date: 02/07/2018 CLINICAL DATA:  Three days of upper abdominal discomfort associated with nausea and  vomiting. EXAM: ABDOMEN ULTRASOUND COMPLETE COMPARISON:  None in PACs FINDINGS: Gallbladder: The gallbladder is adequately distended. No stones or sludge are observed. There is no gallbladder wall thickening, pericholecystic fluid, or positive sonographic Murphy's sign. Common bile duct: Diameter: 3.7 mm Liver: No focal lesion identified. Within normal limits in parenchymal echogenicity. Portal vein is patent on color Doppler imaging with normal direction of blood flow towards the liver. IVC: No abnormality visualized. Pancreas: Visualized portion unremarkable. Spleen: The spleen measures 12 cm in length. Its echotexture is normal. Right Kidney: Length: 11.5 cm. Echogenicity within normal limits. No mass or hydronephrosis visualized. Left Kidney: Length: 12.2 cm. Echogenicity within normal limits. No mass or hydronephrosis visualized. Abdominal aorta: No aneurysm visualized. Other findings: There is no ascites. IMPRESSION: No gallstones or sonographic evidence of acute cholecystitis. If there are clinical concerns of chronic cholecystitis, a nuclear medicine hepatobiliary scan with gallbladder ejection fraction determination may be useful. No acute visceral abnormality observed elsewhere in the abdomen. The spleen is top-normal in size measuring 12 cm in length. Electronically Signed   By: David  [place]Jordan M.D.   On: 02/07/2018 11:35    Procedures Procedures (including critical care  time)  Medications Ordered in ED Medications  sodium chloride 0.9 % bolus 1,000 mL (has no administration in time range)  ondansetron (ZOFRAN) injection 4 mg (has no administration in time range)  ketorolac (TORADOL) 15 MG/ML injection 15 mg (has no administration in time range)     Initial Impression / Assessment and Plan / ED Course  I have reviewed the triage vital signs and the nursing notes.  Pertinent labs & imaging results that were available during my care of the patient were reviewed by me and considered in my medical decision making (see chart for details).     MDM  Screen complete  Patient is presenting for evaluation of weakness, nausea, and fatigue.  Symptoms are most likely related to a viral syndrome.  Screening labs are without significant abnormality.  Patient feels improved following her ED evaluation.  She is aware of the need for close follow-up.  Strict return precautions are given and understood.   Final Clinical Impressions(s) / ED Diagnoses   Final diagnoses:  Nausea  Weakness    ED Discharge Orders        Ordered    promethazine (PHENERGAN) 25 MG tablet  Every 6 hours PRN     02/09/18 1109       Wynetta FinesMessick, Peter C, MD 02/09/18 1111

## 2018-02-09 NOTE — Discharge Instructions (Addendum)
These return for any problem.  Follow-up with your regular doctor as instructed.  Drink plenty of fluids.

## 2018-02-09 NOTE — ED Triage Notes (Signed)
Patient reports she was here on 7/10 for n/v and fatigue, states she had some blood work done and was told to return if symptoms did not improve in 24 hours.

## 2018-10-11 ENCOUNTER — Ambulatory Visit: Payer: Medicaid Other | Admitting: Family Medicine

## 2018-11-28 ENCOUNTER — Other Ambulatory Visit: Payer: Self-pay

## 2018-11-28 ENCOUNTER — Encounter: Payer: Self-pay | Admitting: Nurse Practitioner

## 2018-11-28 ENCOUNTER — Ambulatory Visit: Payer: Self-pay | Attending: Family Medicine | Admitting: Nurse Practitioner

## 2018-11-28 DIAGNOSIS — I10 Essential (primary) hypertension: Secondary | ICD-10-CM

## 2018-11-28 DIAGNOSIS — I5021 Acute systolic (congestive) heart failure: Secondary | ICD-10-CM

## 2018-11-28 DIAGNOSIS — F17200 Nicotine dependence, unspecified, uncomplicated: Secondary | ICD-10-CM

## 2018-11-28 DIAGNOSIS — F419 Anxiety disorder, unspecified: Secondary | ICD-10-CM

## 2018-11-28 DIAGNOSIS — F17210 Nicotine dependence, cigarettes, uncomplicated: Secondary | ICD-10-CM

## 2018-11-28 DIAGNOSIS — F329 Major depressive disorder, single episode, unspecified: Secondary | ICD-10-CM

## 2018-11-28 DIAGNOSIS — F32A Depression, unspecified: Secondary | ICD-10-CM | POA: Insufficient documentation

## 2018-11-28 MED ORDER — HYDROXYZINE HCL 25 MG PO TABS: 25.0000 mg | ORAL_TABLET | Freq: Three times a day (TID) | ORAL | 0 refills | Status: DC | PRN

## 2018-11-28 MED ORDER — CITALOPRAM HYDROBROMIDE 20 MG PO TABS: 20.0000 mg | ORAL_TABLET | Freq: Every day | ORAL | 3 refills | Status: DC

## 2018-11-28 MED FILL — hydrOXYzine HCL 25 MG TABS: 25 | 20 days supply | Qty: 60 | Fill #0

## 2018-11-28 MED FILL — CITALOPRAM HBR 20 MG TABLET: 20 | 30 days supply | Qty: 30 | Fill #0

## 2018-11-28 NOTE — Progress Notes (Signed)
Virtual Visit via Telephone Note Due to national recommendations of social distancing due to COVID 19, telehealth visit is felt to be most appropriate for this patient at this time.  I discussed the limitations, risks, security and privacy concerns of performing an evaluation and management service by telephone and the availability of in person appointments. I also discussed with the patient that there may be a patient responsible charge related to this service. The patient expressed understanding and agreed to proceed.    I connected with Lauren Sharp on 11/28/18  at   1:57 PM EDT  EDT by telephone and verified that I am speaking with the correct person using two identifiers.   Consent I discussed the limitations, risks, security and privacy concerns of performing an evaluation and management service by telephone and the availability of in person appointments. I also discussed with the patient that there may be a patient responsible charge related to this service. The patient expressed understanding and agreed to proceed.   Location of Patient: Private  Residence   [facility]    Persons participating in Telemedicine visit: Lauren DenverZelda Nazaret Chea Sharp Lauren TanainaBien CMA Rhilee Sharp    History of Present Illness: Telemedicine visit for: Establish care.  Patient has a PMH of tobacco dependence, pregnancy induced HTN and chronic systolic CHF, Peripartum cardiomyopathy, and postpartum preeclampsia complicating hypertension (2016) PER CARDIOLOGY NOTE 11-07-2014 Chronic systolic CHF: Suspect peri-partum cardiomyopathy.  She has not had a follow up with Cardiology in 4 years. Most recent ECHO 01-13-2015 showed EF increased from 15 to 50-55%. At that time she was instructed to continue Coreg 3.125mg  BID, Losartan 25mg  and spironolactone 12.5mg  daily. She states she was taken off all medications however I am unable to locate records from Cardiology showing  these medications were discontinued.  She lost her Medicaid and has been unable to follow up with a PCP or Cardiologist in several years.    Anxiety and Depression States she has an Autistic son 28 years old and no reliable support system. Lots of stress. Has been on Celexa and Prozac in the past but can not recall if one was more effective than the other.  She denies any current thoughts of self harm but does endorse ruminating thoughts. She declines callback from the LCSW here.  [facility] 11[facility]28/2018  Decreased Interest 3  Down, Depressed, Hopeless 3  PHQ - 2 Score 6  Altered sleeping 3  Tired, decreased energy 3  Change in appetite 3  Feeling bad or failure about yourself  0  Trouble concentrating 3  Moving slowly or fidgety/restless 0  Suicidal thoughts 1  PHQ-9 Score 19   GAD 7 : Generalized Anxiety Score 11/28/2018  Nervous, Anxious, on Edge 3  Control/stop worrying 3  Worry too much - different things 3  Trouble relaxing 3  Restless 3  Easily annoyed or irritable 3  Afraid - awful might happen 3  Total GAD 7 Score 21      Past Medical History:  Diagnosis Date  . Bacterial vaginosis   . CHF (congestive heart failure) (HCC)   . Depression   . Gonorrhea   . Hypertension   . Psoriasis   . Vaginal Pap smear, abnormal     Past Surgical History:  Procedure Laterality Date  . CESAREAN SECTION N/A 10/21/2014   Procedure: CESAREAN SECTION;  Surgeon: Tracey Harrieshomas Henley, MD;  Location: [facility];  Service: Obstetrics;  Laterality: N/A;  . NO PAST  SURGERIES    . TOOTH EXTRACTION      Family History  Problem Relation Age of Onset  . Arthritis Mother   . Depression Mother   . Hyperlipidemia Mother   . Learning disabilities Mother   . Mental illness Mother   . Varicose Veins Mother   . Alcohol abuse Father   . Arthritis Father   . Asthma Father   . Cancer Father   . Drug abuse Father   . Diabetes Father   . Early death Father   . Heart disease Father   .  Learning disabilities Sister   . Hyperlipidemia Maternal Grandmother   . Hyperlipidemia Maternal Grandfather     Social History   Socioeconomic History  . Marital status: Single    Spouse name: Not on file  . Number of children: Not on file  . Years of education: Not on file  . Highest education level: Not on file  Occupational History  . Not on file  Social Needs  . Financial resource strain: Not on file  . Food insecurity:    Worry: Not on file    Inability: Not on file  . Transportation needs:    Medical: Not on file    Non-medical: Not on file  Tobacco Use  . Smoking status: Current Every Day Smoker    Packs/day: 1.00    Years: 10.00    Pack years: 10.00    Types: Cigarettes    Last attempt to quit: 10/17/2014    Years since quitting: 4.1  . Smokeless tobacco: Never Used  Substance and Sexual Activity  . Alcohol use: Yes    Alcohol/week: 0.0 standard drinks    Comment: Rare  . Drug use: No  . Sexual activity: Yes    Birth control/protection: Injection  Lifestyle  . Physical activity:    Days per week: Not on file    Minutes per session: Not on file  . Stress: Not on file  Relationships  . Social connections:    Talks on phone: Not on file    Gets together: Not on file    Attends religious service: Not on file    Active member of club or organization: Not on file    Attends meetings of clubs or organizations: Not on file    Relationship status: Not on file  Other Topics Concern  . Not on file  Social History Narrative  . Not on file     Observations/Objective: Awake, alert and oriented x 3   Review of Systems  Constitutional: Positive for diaphoresis. Negative for fever, malaise/fatigue and weight loss.  HENT: Negative.  Negative for nosebleeds.   Eyes: Negative.  Negative for blurred vision, double vision and photophobia.  Respiratory: Negative.  Negative for cough and shortness of breath.   Cardiovascular: Negative.  Negative for chest pain,  palpitations and leg swelling.  Gastrointestinal: Negative.  Negative for heartburn, nausea and vomiting.  Musculoskeletal: Negative.  Negative for myalgias.  Neurological: Negative.  Negative for dizziness, focal weakness, seizures and headaches.  Psychiatric/Behavioral: Positive for depression. Negative for suicidal ideas. The patient is nervous/anxious.     Assessment and Plan:  Diagnoses and all orders for this visit:  Essential hypertension Will require blood pressure check. She does not monitor her blood pressure at home.  Instructed to stop smoking as well as incorporate DASH/Mediterranean Diets as healthier choices for HTN.   Anxiety and depression -     citalopram (CELEXA) 20 MG tablet; Take 1  tablet (20 mg total) by mouth daily. -     hydrOXYzine (ATARAX/VISTARIL) 25 MG tablet; Take 1 tablet (25 mg total) by mouth 3 (three) times daily as needed.  Acute systolic CHF (congestive heart failure) (HCC) Will need follow up with Cardiology/ECHO Patient has been advised to apply for financial assistance and schedule to see our financial counselor.   Tobacco dependence Lauren Sharp was counseled on the dangers of tobacco use, and was advised to quit. Reviewed strategies to maximize success, including removing cigarettes and smoking materials from environment, stress management and support of family/friends as well as pharmacological alternatives including: Wellbutrin, Chantix, Nicotine patch, Nicotine gum or lozenges. Smoking cessation support: smoking cessation hotline: 1-800-QUIT-NOW.  Smoking cessation classes are also available through [facility]. Call [phone] or visit our website at [email or web address].   A total of 3 minutes was spent on counseling for smoking cessation and Concha is not ready to quit.      Follow Up Instructions Return in about 4 weeks (around 12/26/2018) for BP recheck, Fasting labs.     I discussed the assessment and treatment plan  with the patient. The patient was provided an opportunity to ask questions and all were answered. The patient agreed with the plan and demonstrated an understanding of the instructions.   The patient was advised to call back or seek an in-person evaluation if the symptoms worsen or if the condition fails to improve as anticipated.  I provided 30 minutes of non-face-to-face time during this encounter including median intraservice time, reviewing previous notes, labs, imaging, medications and explaining diagnosis and management.  Claiborne Rigg, Sharp

## 2018-12-04 ENCOUNTER — Other Ambulatory Visit: Payer: Medicaid Other

## 2018-12-19 ENCOUNTER — Encounter: Payer: Self-pay | Admitting: Nurse Practitioner

## 2018-12-19 ENCOUNTER — Other Ambulatory Visit: Payer: Self-pay

## 2018-12-19 ENCOUNTER — Ambulatory Visit: Payer: Self-pay | Attending: Nurse Practitioner | Admitting: Nurse Practitioner

## 2018-12-19 VITALS — BP 132/83 | HR 87 | Temp 98.9°F | Ht 66.5 in | Wt 180.0 lb

## 2018-12-19 DIAGNOSIS — Z13220 Encounter for screening for lipoid disorders: Secondary | ICD-10-CM

## 2018-12-19 DIAGNOSIS — F32A Depression, unspecified: Secondary | ICD-10-CM

## 2018-12-19 DIAGNOSIS — F17200 Nicotine dependence, unspecified, uncomplicated: Secondary | ICD-10-CM

## 2018-12-19 DIAGNOSIS — F329 Major depressive disorder, single episode, unspecified: Secondary | ICD-10-CM

## 2018-12-19 DIAGNOSIS — R5383 Other fatigue: Secondary | ICD-10-CM

## 2018-12-19 DIAGNOSIS — F419 Anxiety disorder, unspecified: Secondary | ICD-10-CM

## 2018-12-19 DIAGNOSIS — G4700 Insomnia, unspecified: Secondary | ICD-10-CM

## 2018-12-19 MED ORDER — TRAZODONE HCL 100 MG PO TABS: 100.0000 mg | ORAL_TABLET | Freq: Every day | ORAL | 2 refills | Status: DC

## 2018-12-19 MED ORDER — CITALOPRAM HYDROBROMIDE 20 MG PO TABS: 20.0000 mg | ORAL_TABLET | Freq: Every day | ORAL | 3 refills | Status: DC

## 2018-12-19 NOTE — Progress Notes (Signed)
Assessment & Plan:  Lauren Sharp was seen today for blood pressure check.  Diagnoses and all orders for this visit:  Anxiety and depression -     TSH -     citalopram (CELEXA) 20 MG tablet; Take 1 tablet (20 mg total) by mouth daily. -     Consult to social work  Insomnia, unspecified type -     traZODone (DESYREL) 100 MG tablet; Take 1 tablet (100 mg total) by mouth at bedtime. -     TSH We discussed appropriate sleep hygiene and barriers to sleep including dietary modifications.   Fatigue, unspecified type -     Basic metabolic panel -     CBC -     TSH -     VITAMIN D 25 Hydroxy (Vit-D Deficiency, Fractures)  Screening for cholesterol level -     Lipid panel    Patient has been counseled on age-appropriate routine health concerns for screening and prevention. These are reviewed and up-to-date. Referrals have been placed accordingly. Immunizations are up-to-date or declined.    Subjective:   Chief Complaint  Patient presents with  . Blood Pressure Check    Pt. is here for BP check. Pt. is having a hard time sleeping.    HPI Lauren Sharp 28 y.o. female presents to office today for follow-up to anxiety and depression.  She also has complaints today recent insomnia.  Insomnia Started a week ago.  Could possibly be related to Celexa versus increased stress.  Has trouble staying asleep.  Goes to sleep around 10pm and wakes up around 2 AM and then falls back to sleep around 4 AM and then wakes up around 7 or 8 AM.  She is then given a medicine for sleep in the hospital several years ago for CHF however she cannot recall the name of the medication.   Anxiety and Depression PHQ 9 score has decreased since starting Celexa 20 mg daily.  She denies any current thoughts of self harm.  Endorses increased stress over the past several days due to lack of finances.  She increased stress with her 91-year-old son who is autistic and home with her all day long while her husband works.  Also  states her family may be losing their home soon.  I will refer her to our onsite clinical social worker for additional community resources. Depression screen [facility] 2/9 12/19/2018 11/28/2018  Decreased Interest 0 3  Down, Depressed, Hopeless 2 3  PHQ - 2 Score 2 6  Altered sleeping 3 3  Tired, decreased energy 3 3  Change in appetite 3 3  Feeling bad or failure about yourself  1 0  Trouble concentrating 3 3  Moving slowly or fidgety/restless 0 0  Suicidal thoughts 0 1  PHQ-9 Score 15 19    Review of Systems  Constitutional: Positive for malaise/fatigue. Negative for fever and weight loss.  HENT: Negative.  Negative for nosebleeds.   Eyes: Negative.  Negative for blurred vision, double vision and photophobia.  Respiratory: Negative.  Negative for cough and shortness of breath.   Cardiovascular: Negative.  Negative for chest pain, palpitations and leg swelling.  Gastrointestinal: Negative.  Negative for heartburn, nausea and vomiting.  Musculoskeletal: Negative.  Negative for myalgias.  Neurological: Positive for headaches. Negative for dizziness, focal weakness and seizures.  Psychiatric/Behavioral: Positive for depression. Negative for suicidal ideas. The patient is nervous/anxious and has insomnia.        Brain fog; requesting adderall which she has taken  as a teen. I have instructed her this medication would need to be managed by psychiatry.     Past Medical History:  Diagnosis Date  . Bacterial vaginosis   . CHF (congestive heart failure) (HCC)   . Depression   . Gonorrhea   . Hypertension   . Psoriasis   . Vaginal Pap smear, abnormal     Past Surgical History:  Procedure Laterality Date  . CESAREAN SECTION N/A 10/21/2014   Procedure: CESAREAN SECTION;  Surgeon: Tracey Harries, MD;  Location: [facility];  Service: Obstetrics;  Laterality: N/A;  . NO PAST SURGERIES    . TOOTH EXTRACTION      Family History  Problem Relation Age of Onset  . Arthritis Mother   . Depression  Mother   . Hyperlipidemia Mother   . Learning disabilities Mother   . Mental illness Mother   . Varicose Veins Mother   . Alcohol abuse Father   . Arthritis Father   . Asthma Father   . Cancer Father   . Drug abuse Father   . Diabetes Father   . Early death Father   . Heart disease Father   . Learning disabilities Sister   . Hyperlipidemia Maternal Grandmother   . Hyperlipidemia Maternal Grandfather     Social History Reviewed with no changes to be made today.   Outpatient Medications Prior to Visit  Medication Sig Dispense Refill  . hydrOXYzine (ATARAX/VISTARIL) 25 MG tablet Take 1 tablet (25 mg total) by mouth 3 (three) times daily as needed. 60 tablet 0  . citalopram (CELEXA) 20 MG tablet Take 1 tablet (20 mg total) by mouth daily. 30 tablet 3  . calcium carbonate (TUMS EX) 750 MG chewable tablet Chew 1 tablet by mouth as needed for heartburn.    . medroxyPROGESTERone (DEPO-PROVERA) 150 MG/ML injection Inject 150 mg into the muscle every 3 (three) months.    . polyethylene glycol (MIRALAX / GLYCOLAX) packet Take 17 g by mouth daily as needed for mild constipation.     No facility-administered medications prior to visit.     Allergies  Allergen Reactions  . Lisinopril Cough       Objective:    BP 132/83 (BP Location: Left Arm, Patient Position: Sitting, Cuff Size: Normal)   Pulse 87   Temp 98.9 F (37.2 C) (Oral)   Ht 5' 6.5" (1.689 m)   Wt 180 lb (81.6 kg)   SpO2 99%   BMI 28.62 kg/m  Wt Readings from Last 3 Encounters:  12/19/18 180 lb (81.6 kg)  02/07/18 189 lb (85.7 kg)  08/09/16 193 lb 3.2 oz (87.6 kg)    Physical Exam Vitals signs and nursing note reviewed.  Constitutional:      Appearance: She is well-developed.  HENT:     Head: Normocephalic and atraumatic.  Neck:     Musculoskeletal: Normal range of motion.  Cardiovascular:     Rate and Rhythm: Normal rate and regular rhythm.     Heart sounds: Normal heart sounds. No murmur. No friction rub.  No gallop.   Pulmonary:     Effort: Pulmonary effort is normal. No tachypnea or respiratory distress.     Breath sounds: Normal breath sounds. No decreased breath sounds, wheezing, rhonchi or rales.  Chest:     Chest wall: No tenderness.  Abdominal:     General: Bowel sounds are normal.     Palpations: Abdomen is soft.  Musculoskeletal: Normal range of motion.  Skin:    General:  Skin is warm and dry.  Neurological:     Mental Status: She is alert and oriented to person, place, and time.     Coordination: Coordination normal.  Psychiatric:        Mood and Affect: Mood is depressed.        Speech: Speech normal.        Behavior: Behavior normal. Behavior is cooperative.        Thought Content: Thought content normal. Thought content does not include homicidal or suicidal ideation. Thought content does not include homicidal or suicidal plan.        Cognition and Memory: Cognition normal.        Judgment: Judgment normal.        Patient has been counseled extensively about nutrition and exercise as well as the importance of adherence with medications and regular follow-up. The patient was given clear instructions to go to ER or return to medical center if symptoms don't improve, worsen or new problems develop. The patient verbalized understanding.   Follow-up: Return in about 4 weeks (around 01/16/2019) for insomnia.   Claiborne RiggZelda W Celestia Duva, FNP-BC [facility] [place], [place] [phone]   12/19/2018, 3:14 PM

## 2018-12-20 ENCOUNTER — Other Ambulatory Visit: Payer: Self-pay | Admitting: Nurse Practitioner

## 2018-12-20 DIAGNOSIS — F32A Depression, unspecified: Secondary | ICD-10-CM

## 2018-12-20 DIAGNOSIS — F329 Major depressive disorder, single episode, unspecified: Secondary | ICD-10-CM

## 2018-12-20 LAB — CBC
Hematocrit: 45.4 % (ref 34.0–46.6)
Hemoglobin: 16 g/dL — ABNORMAL HIGH (ref 11.1–15.9)
MCH: 30.2 pg (ref 26.6–33.0)
MCHC: 35.2 g/dL (ref 31.5–35.7)
MCV: 86 fL (ref 79–97)
Platelets: 321 10*3/uL (ref 150–450)
RBC: 5.29 x10E6/uL — ABNORMAL HIGH (ref 3.77–5.28)
RDW: 12.8 % (ref 11.7–15.4)
WBC: 12.9 10*3/uL — ABNORMAL HIGH (ref 3.4–10.8)

## 2018-12-20 LAB — BASIC METABOLIC PANEL
BUN/Creatinine Ratio: 12 (ref 9–23)
BUN: 8 mg/dL (ref 6–20)
CO2: 22 mmol/L (ref 20–29)
Calcium: 10.8 mg/dL — ABNORMAL HIGH (ref 8.7–10.2)
Chloride: 106 mmol/L (ref 96–106)
Creatinine, Ser: 0.68 mg/dL (ref 0.57–1.00)
GFR calc Af Amer: 138 mL/min/{1.73_m2} (ref >59)
GFR calc non Af Amer: 119 mL/min/{1.73_m2} (ref >59)
Glucose: 99 mg/dL (ref 65–99)
Potassium: 3.8 mmol/L (ref 3.5–5.2)
Sodium: 142 mmol/L (ref 134–144)

## 2018-12-20 LAB — LIPID PANEL
Chol/HDL Ratio: 4.4 ratio (ref 0.0–4.4)
Cholesterol, Total: 157 mg/dL (ref 100–199)
HDL: 36 mg/dL — ABNORMAL LOW (ref >39)
LDL Calculated: 106 mg/dL — ABNORMAL HIGH (ref 0–99)
Triglycerides: 77 mg/dL (ref 0–149)
VLDL Cholesterol Cal: 15 mg/dL (ref 5–40)

## 2018-12-20 LAB — VITAMIN D 25 HYDROXY (VIT D DEFICIENCY, FRACTURES): Vit D, 25-Hydroxy: 8 ng/mL — ABNORMAL LOW (ref 30.0–100.0)

## 2018-12-20 LAB — TSH: TSH: 0.828 u[IU]/mL (ref 0.450–4.500)

## 2018-12-24 ENCOUNTER — Other Ambulatory Visit: Payer: Self-pay | Admitting: Nurse Practitioner

## 2018-12-24 MED ORDER — VITAMIN D (ERGOCALCIFEROL) 1.25 MG (50000 UNIT) PO CAPS: 50000.0000 [IU] | ORAL_CAPSULE | ORAL | 1 refills | Status: DC

## 2018-12-25 MED FILL — VIT D2 1.25 MG (50,000 UNIT: 1.25 MG | 84 days supply | Qty: 12 | Fill #0

## 2019-01-03 ENCOUNTER — Encounter (HOSPITAL_COMMUNITY): Payer: Self-pay | Admitting: *Deleted

## 2019-01-03 ENCOUNTER — Other Ambulatory Visit: Payer: Self-pay

## 2019-01-03 ENCOUNTER — Inpatient Hospital Stay (HOSPITAL_COMMUNITY)
Admission: RE | Admit: 2019-01-03 | Discharge: 2019-01-05 | DRG: 885 | Disposition: A | Discharge status: Home / Self Care | Payer: No Typology Code available for payment source | Attending: Psychiatry | Admitting: Psychiatry

## 2019-01-03 ENCOUNTER — Other Ambulatory Visit: Payer: Self-pay | Admitting: Behavioral Health

## 2019-01-03 DIAGNOSIS — F32A Depression, unspecified: Secondary | ICD-10-CM

## 2019-01-03 DIAGNOSIS — F1120 Opioid dependence, uncomplicated: Secondary | ICD-10-CM | POA: Diagnosis present

## 2019-01-03 DIAGNOSIS — F17210 Nicotine dependence, cigarettes, uncomplicated: Secondary | ICD-10-CM | POA: Diagnosis present

## 2019-01-03 DIAGNOSIS — G4700 Insomnia, unspecified: Secondary | ICD-10-CM

## 2019-01-03 DIAGNOSIS — F322 Major depressive disorder, single episode, severe without psychotic features: Principal | ICD-10-CM | POA: Diagnosis present

## 2019-01-03 DIAGNOSIS — F419 Anxiety disorder, unspecified: Secondary | ICD-10-CM

## 2019-01-03 DIAGNOSIS — R45851 Suicidal ideations: Secondary | ICD-10-CM | POA: Diagnosis present

## 2019-01-03 DIAGNOSIS — F329 Major depressive disorder, single episode, unspecified: Secondary | ICD-10-CM

## 2019-01-03 HISTORY — DX: Urinary tract infection, site not specified: N39.0

## 2019-01-03 MED ORDER — NICOTINE POLACRILEX 2 MG MT GUM
2.0000 mg | CHEWING_GUM | OROMUCOSAL | Status: DC | PRN
  Administered 2019-01-03: 2 mg via ORAL

## 2019-01-03 MED ORDER — ACETAMINOPHEN 325 MG PO TABS: 650.0000 mg | ORAL_TABLET | Freq: Four times a day (QID) | ORAL | Status: DC | PRN

## 2019-01-03 MED ORDER — HYDROXYZINE HCL 25 MG PO TABS
25.0000 mg | ORAL_TABLET | Freq: Three times a day (TID) | ORAL | Status: DC | PRN
  Administered 2019-01-04 (×2): 25 mg via ORAL
  Filled 2019-01-03: qty 1
  Filled 2019-01-03: qty 10
  Filled 2019-01-03: qty 1

## 2019-01-03 MED ORDER — ALUM & MAG HYDROXIDE-SIMETH 200-200-20 MG/5ML PO SUSP: 30.0000 mL | ORAL | Status: DC | PRN

## 2019-01-03 MED ORDER — TRAZODONE HCL 100 MG PO TABS
100.0000 mg | ORAL_TABLET | Freq: Every day | ORAL | Status: DC
  Administered 2019-01-03 – 2019-01-04 (×2): 100 mg via ORAL
  Filled 2019-01-03 (×2): qty 1
  Filled 2019-01-03: qty 7
  Filled 2019-01-03 (×3): qty 1

## 2019-01-03 NOTE — BH Assessment (Addendum)
Assessment Note  Lauren Sharp is a single 28 y.o. female who presents voluntarily (at insistence of her sisters) accompanied by her sister Lauren Sharp. Pt reports symptoms of depression & opioid abuse. She reports having vague suicidal ideation this past weekend, but denies current plan or attempt. Sister reports pt did make a suicide attempt this past weekend. Pt told her other sister she stuffed exhaust of her car and sat in car until she thought nothing was going to happen & quit. Sister also reports pt told her coworker she didn't want to "be here anymore" & that she didn't want her 414 yo son anymore. Sister states that pt loves & cares for her son. Pt has asked her sisters to care for her son since last weekend. Pt has a history of Depression & gets antidepressant rx [facility].  Pt reports medication compliance. Pt denies homicidal ideation/ history of violence. Pt denies AVH & other psychotic symptoms. Pt states current stressors include her mother having just sold the house that pt has lived in for many years. Pt states the home was her fathers & supposed to belong to her.   Pt lives with her boyfriend, and supports include her sisters. Pt reports there is a family history of addiction. 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 She has limited insight and judgment. Pt's memory is intact. Legal history includes no charges per pt. ? Pt has no OP & IP tx history. Pt reports no alcohol use, aside from this weekend when she got upset & drank too much. She reports she snorts heroin about 2 x months. Sister reports pt & boyfriend use about $125 of heroin daily. Pt reports she gets suboxone off the street & uses a few times weekly. Pt confirmed she uses thc daily when asked. She denied all other drugs. Xanax was written on her intake form. ? MSE: Pt is casually dressed, alert, oriented x4 with soft speech and restless motor behavior. Eye contact is fair. Pt's mood is  depressed & anxious. Affect is congruent with mood. Thought process is relevant. There is no indication pt is currently responding to internal stimuli or experiencing delusional thought content. Pt's hx differed from sisters. Pt minimized drug use & denied suicide attempt.  Pt refused inpt admission. IVC will be initiated.   Diagnosis: MDD, single, severe without psychosis; opioid dependence; benzo abuse Disposition: Denzil MagnusonLaShunda Thomas, NP recommends psychiatric inpt.   Past Medical History:  Past Medical History:  Diagnosis Date  . Bacterial vaginosis   . CHF (congestive heart failure) (HCC)   . Depression   . Gonorrhea   . Hypertension   . Psoriasis   . Vaginal Pap smear, abnormal     Past Surgical History:  Procedure Laterality Date  . CESAREAN SECTION N/A 10/21/2014   Procedure: CESAREAN SECTION;  Surgeon: Tracey Harrieshomas Henley, MD;  Location: [facility];  Service: Obstetrics;  Laterality: N/A;  . NO PAST SURGERIES    . TOOTH EXTRACTION      Family History:  Family History  Problem Relation Age of Onset  . Arthritis Mother   . Depression Mother   . Hyperlipidemia Mother   . Learning disabilities Mother   . Mental illness Mother   . Varicose Veins Mother   . Alcohol abuse Father   . Arthritis Father   . Asthma Father   . Cancer Father   . Drug abuse Father   . Diabetes Father   . Early death Father   .  Heart disease Father   . Learning disabilities Sister   . Hyperlipidemia Maternal Grandmother   . Hyperlipidemia Maternal Grandfather     Social History:  reports that she has been smoking cigarettes. She has a 10.00 pack-year smoking history. She has never used smokeless tobacco. She reports current alcohol use. She reports that she does not use drugs.  Additional Social History:  Alcohol / Drug Use Pain Medications: See MAR Prescriptions: hydroxyzine HCL  Tid; Citalopram  qd Over the Counter: See MAR History of alcohol / drug use?: Yes Substance #1 Name of  Substance 1: heroin 1 - Amount (size/oz): varies 1 - Frequency: snorts q 2 weeks per pt; sister states pt & bf use $125 daily 1 - Duration: couple years 1 - Last Use / Amount: 2 weeks ago Substance #2 Name of Substance 2: xanax per sister 2 - Last Use / Amount: UTA; sister states short straws on table when picked pt up today Substance #3 Name of Substance 3: Suboxone 3 - Age of First Use: 28 3 - Amount (size/oz): varies 3 - Frequency: few x weekly 3 - Duration: ongoing 3 - Last Use / Amount: last week  CIWA:   COWS:    Allergies:  Allergies  Allergen Reactions  . Lisinopril Cough    Home Medications: (Not in a hospital admission)   OB/GYN Status:  No LMP recorded. (Menstrual status: Irregular Periods).  General Assessment Data Location of Assessment: [facility] Assessment Services TTS Assessment: In system Is this a Tele or Face-to-Face Assessment?: Face-to-Face Is this an Initial Assessment or a Re-assessment for this encounter?: Initial Assessment Patient Accompanied by:: Other(sister, Mellody Drown) Language Other than English: No Living Arrangements: Other (Comment) What gender do you identify as?: Female Marital status: Long term relationship Living Arrangements: Spouse/significant other(& 63 yo son) Can pt return to current living arrangement?: Yes Admission Status: Voluntary Is patient capable of signing voluntary admission?: Yes Referral Source: Self/Family/Friend Insurance type: none     Crisis Care Plan Living Arrangements: Spouse/significant other(& 57 yo son) Name of Psychiatrist: [facility](rx for antidepressant & med mngt) Name of Therapist: no  Education Status Is patient currently in school?: No Is the patient employed, unemployed or receiving 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 : Yes Suicidal Intent: No-Not Currently/Within Last 6 Months Has patient had any suicidal intent within the past 6 months prior to admission? : Yes Is patient at risk for suicide?: Yes Suicidal Plan?: No-Not Currently/Within Last 6 Months Has patient had any suicidal plan within the past 6 months prior to admission? : Yes Access to Means: Yes What has been your use of drugs/alcohol within the last 12 months?: frequent Previous Attempts/Gestures: Yes(pt denies; sister reports pt made recent exhaust attempt) How many times?: 1 Other Self Harm Risks: drug use; loss of home pending; pt told co-worker she doesn't want her son.  Triggers for Past Attempts: Family contact(told she has to move out of home- mother is selling it) Intentional Self Injurious Behavior: None Recent stressful life event(s): Loss (Comment)(her home has been sold by mother) Persecutory voices/beliefs?: No Depression: Yes Depression Symptoms: Despondent, Tearfulness, Insomnia, Isolating, Loss of interest in usual pleasures Substance abuse history and/or treatment for substance abuse?: Yes Suicide prevention information given to non-admitted patients: Yes  Risk to Others within the past 6  months Homicidal Ideation: No Does patient have any lifetime risk of violence toward others beyond the six months prior to admission? : No Thoughts of Harm to Others: No Current Homicidal Intent: No Current Homicidal Plan: No History of harm to others?: No Assessment of Violence: None Noted Does patient have access to weapons?: No(denies firearms) Criminal Charges Pending?: No Does patient have a court date: No Is patient on probation?: No  Psychosis Hallucinations: None noted Delusions: None noted  Mental Status Report Appearance/Hygiene: Disheveled Eye Contact: Fair Motor Activity: Restlessness Speech: Logical/coherent Level of Consciousness: Alert Mood: Apprehensive Affect: Constricted,  Apprehensive Anxiety Level: Moderate Thought Processes: Relevant Judgement: Partial Orientation: Person, Place, Time, Situation Obsessive Compulsive Thoughts/Behaviors: None  Cognitive Functioning Concentration: Good Memory: Recent Intact, Remote Intact Is patient IDD: No Insight: Poor Impulse Control: Fair Appetite: Fair Have you had any weight changes? : Loss Amount of the weight change? (lbs): 10 lbs Sleep: Decreased Total Hours of Sleep: 7 Vegetative Symptoms: None  ADLScreening [facility] Assessment Services) Patient's cognitive ability adequate to safely complete daily activities?: Yes Patient able to express need for assistance with ADLs?: Yes Independently performs ADLs?: Yes (appropriate for developmental age)  Prior Inpatient Therapy Prior Inpatient Therapy: No  Prior Outpatient Therapy Prior Outpatient Therapy: No Does patient have an ACCT team?: No Does patient have Intensive In-House Services?  : No Does patient have [facility] services? : No Does patient have P4CC services?: No  ADL Screening (condition at time of admission) Patient's cognitive ability adequate to safely complete daily activities?: Yes Is the patient deaf or have difficulty hearing?: No Does the patient have difficulty seeing, even when wearing glasses/contacts?: No Does the patient have difficulty concentrating, remembering, or making decisions?: No Patient able to express need for assistance with ADLs?: Yes Does the patient have difficulty dressing or bathing?: No Independently performs ADLs?: Yes (appropriate for developmental age) Does the patient have difficulty walking or climbing stairs?: No Weakness of Legs: None Weakness of Arms/Hands: None  Home Assistive Devices/Equipment Home Assistive Devices/Equipment: None  Therapy Consults (therapy consults require a physician order) PT Evaluation Needed: No OT Evalulation Needed: No SLP Evaluation Needed: No   Values / Beliefs Cultural  Requests During Hospitalization: None Spiritual Requests During Hospitalization: None Consults Spiritual Care Consult Needed: No Social Work Consult Needed: No Merchant navy officer (For Healthcare) Does Patient Have a Medical Advance Directive?: No Would patient like information on creating a medical advance directive?: No - Patient declined          Disposition: Denzil Magnuson, NP recommends psychiatric inpt.  Disposition Initial Assessment Completed for this Encounter: Yes Disposition of Patient: Discharge  On Site Evaluation by:   Reviewed with Physician:    Clearnce Sorrel 01/03/2019 4:23 PM

## 2019-01-03 NOTE — Tx Team (Signed)
Initial Treatment Plan 01/03/2019 7:46 PM Lauren Sharp [id number]    PATIENT STRESSORS: Financial difficulties Marital or family conflict Other: Substance use (heroin)   PATIENT STRENGTHS: Average or above average intelligence Communication skills General fund of knowledge Supportive family/friends   PATIENT IDENTIFIED PROBLEMS: Suicide Risk  Coping skills for depression  Substance use                 DISCHARGE CRITERIA:  Improved stabilization in mood, thinking, and/or behavior Need for constant or close observation no longer present  PRELIMINARY DISCHARGE PLAN: Return to previous living arrangement  PATIENT/FAMILY INVOLVEMENT: This treatment plan has been presented to and reviewed with the patient, Lauren Sharp and her sister.  The patient and family have been given the opportunity to ask questions and make suggestions.  Karren Burly, RN 01/03/2019, 7:46 PM

## 2019-01-03 NOTE — Progress Notes (Signed)
Pt. Transferred from observation unit to room 407, she was oriented to the unit without incident and with safety maintained.  Pt. Provided with clean scrubs per her request.  Pt. States that she would not likely utilize the dayroom, "because I don't like to be around people". Pt. Currently denies SI/HI or AVH and remains calm/cooperative. Pt. Denies any other requests/needs at this time, she has a sandwich tray and denies any pain.

## 2019-01-03 NOTE — Progress Notes (Signed)
D:  Lauren Sharp was in her bed on initial approach.  She was irritable and angry about being in the hospital.  She doesn't feel like she needs to be here.  Provided her with specimen cup for urine sample.  She denied SI/HI or A/V hallucinations.  Talked with her about transferring to the adult unit when bed is available.  She started complaining about having a roommate, then stated "what if I want to hurt them."  She wouldn't talk further about treatment, turned over in the bed and covered up.  Informed AC about her statement regarding having a roommate.  A:  1:1 with RN for support and encouragement.  Medications as ordered.  Q 15 minute checks maintained for safety.   R:  Lauren Sharp remains safe on the unit.  We will continue to monitor the progress towards her goals.

## 2019-01-03 NOTE — Progress Notes (Signed)
[place] NOVEL CORONAVIRUS (COVID-19) DAILY CHECK-OFF SYMPTOMS - answer yes or no to each - every day NO YES  Have you had a fever in the past 24 hours?  . Fever (Temp > 37.80C / 100F) X   Have you had any of these symptoms in the past 24 hours? . New Cough .  Sore Throat  .  Shortness of Breath .  Difficulty Breathing .  Unexplained Body Aches   X   Have you had any one of these symptoms in the past 24 hours not related to allergies?   . Runny Nose .  Nasal Congestion .  Sneezing   X   If you have had runny nose, nasal congestion, sneezing in the past 24 hours, has it worsened?  X   EXPOSURES - check yes or no X   Have you traveled outside the state in the past 14 days?  X   Have you been in contact with someone with a confirmed diagnosis of COVID-19 or PUI in the past 14 days without wearing appropriate PPE?  X   Have you been living in the same home as a person with confirmed diagnosis of COVID-19 or a PUI (household contact)?    X   Have you been diagnosed with COVID-19?    X              What to do next: Answered NO to all: Answered YES to anything:   Proceed with unit schedule Follow the [facility] Inpatient Flowsheet.   

## 2019-01-03 NOTE — Progress Notes (Signed)
Pt is a 28 y.o. female who came in voluntarily as a walk-in with her sister. Pt minimized current suicidal ideation but did admit to intentionally stuffing a piece of cloth or clothing in the exhaust of her car and sat in car in a suicide attempt a week ago.  Pt was irritable and stated that she wanted to go home as soon possible.  MD evaluated the pt and plans to IVC the pt for safety.  Pt has a hx of heroin use and admits to using today. Reports she gets suboxone off the street & uses a few times weekly. She denies any other substances at this time. She takes Celexa for hx of depression. Denies AV hallucination.  Admission assessment and search completed,  Belongings listed and secured.  Treatment plan explained and pt. oriented to unit.

## 2019-01-03 NOTE — H&P (Signed)
Behavioral Health Medical Screening Exam  Lauren Sharp is an 28 y.o. female.female who presented to [facility] as a walk[facility], voluntarily  accompanied with her sister. Patient reports this past Saturday, 12/29/2018. she intentionally stuffed a piece of cloth or clothing in the exhaust of her car and sat in car in a suicide attempt. She reports prior to the attempt, she was told by her mother that she was selling the house which she has lived in for the past 20 years which belonged to her father. She reports before the attempt, she was drinking and she admits to a history of substance abuse that includes heroin use. She denies doing heroin on that day. Reports her last use of heroin was two weeks ago although she admits to using heroin weekly. Reports she gets suboxone off the street & uses a few times weekly. She denies other substance abuse or use.  She denies any previous suicide attempts although admits that she has had thoughts in the past. She has a history of depression and is currently on Citalopram managed by her family doctor. She minimizes any suicidal thoughts today and states she is not suicidal and wants to go home. She denies psychotic symptoms or homicidal ideations. She endorses a significant family history of mental health illness as well as substance abuse. Pt has no OP & IP tx history    Because she seemed to be minimizing I asked permission to speak with her sister. Verbal permission was provided. As per sister, patient became very upset this past weekend and she tried to commit suicide in the fashion as noted above. Reports she also spoke with her other sister who stated that patient told her coworker she didn't want to "be here anymore" & that she didn't want her 504 yo son anymore.Reports that patient has also talked about killing herself to her other sister. Shares that she does not believe that patient can keep herself safe. Acknowledges that patient does abuse heroin although she is unsure of her  last use. Shares that patient and her boyfriend abuses heroin and not only did they spend their income tax on drugs but they too use about $125 of heroin daily.      Total Time spent with patient: 45 minutes  Psychiatric Specialty Exam: Physical Exam  Nursing note and vitals reviewed. Constitutional: She is oriented to person, place, and time.  Neurological: She is alert and oriented to person, place, and time.    Review of Systems  Psychiatric/Behavioral: Positive for depression, substance abuse and suicidal ideas. Negative for memory loss. The patient is nervous/anxious. The patient does not have insomnia.     There were no vitals taken for this visit.There is no height or weight on file to calculate BMI.  General Appearance: Fairly Groomed  Eye Contact:  Minimal  Speech:  Clear and Coherent and Normal Rate  Volume:  Normal  Mood:  Anxious and Depressed  Affect:  Congruent  Thought Process:  Coherent, Linear and Descriptions of Associations: Intact  Orientation:  Full (Time, Place, and Person)  Thought Content:  WDL  Suicidal Thoughts:  Yes.  with intent/plan  Homicidal Thoughts:  No  Memory:  Immediate;   Fair Recent;   Fair  Judgement:  Poor  Insight:  poor  Psychomotor Activity:  Restlessness  Concentration: Concentration: Fair and Attention Span: Fair  Recall:  [employer]Fair  Fund of Knowledge:Fair  Language: Good  Akathisia:  Negative  Handed:  Right  AIMS (if indicated):  Assets:  Resilience Social Support  Sleep:       Musculoskeletal: Strength & Muscle Tone: within normal limits Gait & Station: normal Patient leans: N/A  There were no vitals taken for this visit.  Recommendations:  Based on my evaluation the patient does not appear to have an emergency medical condition.  There is evidence of imminent risk to self at present.   Patient does meet criteria for psychiatric inpatient admission. Bed will be given on the adult psychiatric unit here at [facility].    Patient initially voluntary.She presents as a high risk and due to the seriousness of her suicide attempt, we discussed inpatient hospitalization. Patient refused. Along with Dr. Jola Babinski, the seriousness of her situation was discussed. Patient continued to refuse volubnatry admission so IVC was completed.        Denzil Magnuson, NP 01/03/2019, 4:46 PM

## 2019-01-04 DIAGNOSIS — F322 Major depressive disorder, single episode, severe without psychotic features: Principal | ICD-10-CM

## 2019-01-04 DIAGNOSIS — F1120 Opioid dependence, uncomplicated: Secondary | ICD-10-CM

## 2019-01-04 DIAGNOSIS — F1310 Sedative, hypnotic or anxiolytic abuse, uncomplicated: Secondary | ICD-10-CM | POA: Diagnosis not present

## 2019-01-04 LAB — RAPID URINE DRUG SCREEN, HOSP PERFORMED
Amphetamines: NOT DETECTED
Amphetamines: NOT DETECTED
Barbiturates: NOT DETECTED
Barbiturates: NOT DETECTED
Benzodiazepines: NOT DETECTED
Benzodiazepines: NOT DETECTED
Cocaine: NOT DETECTED
Cocaine: NOT DETECTED
Opiates: NOT DETECTED
Opiates: NOT DETECTED
Tetrahydrocannabinol: POSITIVE — AB
Tetrahydrocannabinol: POSITIVE — AB

## 2019-01-04 LAB — PREGNANCY, URINE: Preg Test, Ur: NEGATIVE

## 2019-01-04 MED ORDER — NAPROXEN 500 MG PO TABS
500.0000 mg | ORAL_TABLET | Freq: Two times a day (BID) | ORAL | Status: DC | PRN
  Administered 2019-01-04: 500 mg via ORAL
  Filled 2019-01-04: qty 1

## 2019-01-04 MED ORDER — LOPERAMIDE HCL 2 MG PO CAPS: 2.0000 mg | ORAL_CAPSULE | ORAL | Status: DC | PRN

## 2019-01-04 MED ORDER — ONDANSETRON 4 MG PO TBDP: 4.0000 mg | ORAL_TABLET | Freq: Four times a day (QID) | ORAL | Status: DC | PRN

## 2019-01-04 MED ORDER — CLONIDINE HCL 0.1 MG PO TABS
0.1000 mg | ORAL_TABLET | ORAL | Status: DC
  Filled 2019-01-04 (×3): qty 1

## 2019-01-04 MED ORDER — METHOCARBAMOL 500 MG PO TABS: 500.0000 mg | ORAL_TABLET | Freq: Three times a day (TID) | ORAL | Status: DC | PRN

## 2019-01-04 MED ORDER — DICYCLOMINE HCL 20 MG PO TABS: 20.0000 mg | ORAL_TABLET | Freq: Four times a day (QID) | ORAL | Status: DC | PRN

## 2019-01-04 MED ORDER — CITALOPRAM HYDROBROMIDE 40 MG PO TABS
40.0000 mg | ORAL_TABLET | Freq: Every day | ORAL | Status: DC
  Administered 2019-01-04 – 2019-01-05 (×2): 40 mg via ORAL
  Filled 2019-01-04 (×2): qty 1
  Filled 2019-01-04: qty 7
  Filled 2019-01-04: qty 1

## 2019-01-04 MED ORDER — CLONIDINE HCL 0.1 MG PO TABS: 0.1000 mg | ORAL_TABLET | Freq: Every day | ORAL | Status: DC

## 2019-01-04 MED ORDER — CLONIDINE HCL 0.1 MG PO TABS
0.1000 mg | ORAL_TABLET | Freq: Four times a day (QID) | ORAL | Status: DC
  Administered 2019-01-04 – 2019-01-05 (×5): 0.1 mg via ORAL
  Filled 2019-01-04 (×9): qty 1

## 2019-01-04 NOTE — Progress Notes (Signed)
Patient ID: Lauren Sharp, female   DOB: [birth year], 28 y.o.   MRN: [id number] D: Patient observed in dayroom watching TV.  Pt mood and affect appears depressed and anxious. Denies  SI/HI/AVH and pain.No behavioral issues noted.  A: Support and encouragement offered as needed to express needs. Medications administered as prescribed.  R: Patient is safe and cooperative on unit. Will continue to monitor  for safety and stability.

## 2019-01-04 NOTE — Progress Notes (Signed)
Recreation Therapy Notes  Date:  6.5.20 Time: 0930 Location: 300 Hall Dayroom  Group Topic: Stress Management  Goal Area(s) Addresses:  Patient will identify positive stress management techniques. Patient will identify benefits of using stress management post d/c.  Intervention: Stress Management  Activity :  Meditation.  LRT introduced the stress management technique of meditation.  LRT played a meditation that guided patients on being resilient in the face of adversity.  Education:  Stress Management, Discharge Planning.   Education Outcome: Acknowledges Education  Clinical Observations/Feedback:  Pt did not attend group session.    Caroll Rancher, LRT/CTRS         Caroll Rancher A 01/04/2019 11:55 AM

## 2019-01-04 NOTE — Progress Notes (Signed)
The patient's positive event for the day was that she spent time in the dayroom with her peers. She also had a good talk with her family over the telephone. Her goal for tomorrow is to stay positive and to try to "keep it together".

## 2019-01-04 NOTE — Plan of Care (Addendum)
  Problem: Medication: Goal: Compliance with prescribed medication regimen will improve Outcome: Progressing   Problem: Activity: Goal: Interest or engagement in activities will improve Outcome: Progressing

## 2019-01-04 NOTE — H&P (Signed)
Psychiatric Admission Assessment Adult  Patient Identification: Lauren Sharp MRN:  [id number] Date of Evaluation:  01/04/2019 Chief Complaint:  MDD single,severe without psychosis Opioid dependence beno abuse Principal Diagnosis: <principal problem not specified> Diagnosis:  Active Problems:   MDD (major depressive disorder)  History of Present Illness: Patient is seen and examined.  Patient is a 28 year old female with a past psychiatric history significant for depression as well as opiate dependence who presented as a walk-in patient to the behavioral health hospital on 01/03/2019 with suicidal ideation.  The patient came with her sister.  The patient apparently on 5/30 had stuffed a piece of cloth or clothing in the exhaust of her automobile, and sat in her car in an attempt to kill her self.  She stated that the biggest stressor she had was that her mother had recently told her that she was selling the house that the patient was living in.  The patient stated her boyfriend and child lived in at home.  The mother gave the patient 60 days to leave that house.  This overwhelmed her.  She stated that she had no previous psychiatric admissions.  She stated that she had been treated with antidepressants in the past per her family practice doctor.  Patient also admitted to heroin addiction.  She stated that her last use of heroin was on the date of admission.  She also admitted that she had taken buprenorphine off the street previously.  She stated that she uses heroin basically on a daily basis.  She stated that the citalopram which had been prescribed by her primary medical doctor had helped in the past, but that the new stressors had overwhelmed her.  She was resistant to coming in the hospital, and so the patient was placed under involuntary commitment given the seriousness of her attempt.  She was admitted to the hospital for evaluation and stabilization. Associated Signs/Symptoms: Depression Symptoms:   depressed mood, anhedonia, insomnia, psychomotor agitation, fatigue, feelings of worthlessness/guilt, difficulty concentrating, hopelessness, suicidal thoughts with specific plan, suicidal attempt, anxiety, loss of energy/fatigue, disturbed sleep, (Hypo) Manic Symptoms:  Impulsivity, Irritable Mood, Labiality of Mood, Anxiety Symptoms:  Excessive Worry, Psychotic Symptoms:  denied PTSD Symptoms: Negative Total Time spent with patient: 30 minutes  Past Psychiatric History: Patient denied any formal psychiatric treatment in the past.  She has received antidepressant medicines from her primary care provider.  Most recently it has been Celexa, and she was previously treated with 1 other antidepressant of which she is unable to recall that.  She has a history of opiate dependence which is active with heroin use.  She also had previously used benzodiazepines off the street.  Is the patient at risk to self? Yes.    Has the patient been a risk to self in the past 6 months? No.  Has the patient been a risk to self within the distant past? No.  Is the patient a risk to others? No.  Has the patient been a risk to others in the past 6 months? No.  Has the patient been a risk to others within the distant past? No.   Prior Inpatient Therapy: Prior Inpatient Therapy: No Prior Outpatient Therapy: Prior Outpatient Therapy: No Does patient have an ACCT team?: No Does patient have Intensive In-House Services?  : No Does patient have [facility] services? : No Does patient have P4CC services?: No  Alcohol Screening: 1. How often do you have a drink containing alcohol?: Never 2. How many drinks containing alcohol do  you have on a typical day when you are drinking?: 1 or 2 3. How often do you have six or more drinks on one occasion?: Never AUDIT-C Score: 0 4. How often during the last year have you found that you were not able to stop drinking once you had started?: Never 5. How often during the  last year have you failed to do what was normally expected from you becasue of drinking?: Never 6. How often during the last year have you needed a first drink in the morning to get yourself going after a heavy drinking session?: Never 7. How often during the last year have you had a feeling of guilt of remorse after drinking?: Never 8. How often during the last year have you been unable to remember what happened the night before because you had been drinking?: Never 9. Have you or someone else been injured as a result of your drinking?: No 10. Has a relative or friend or a doctor or another health worker been concerned about your drinking or suggested you cut down?: No Alcohol Use Disorder Identification Test Final Score (AUDIT): 0 Substance Abuse History in the last 12 months:  Yes.   Consequences of Substance Abuse: Medical Consequences:  : Psychiatric hospitalization for depression. Family Consequences:  : It is unclear whether or not her drug use has affected the decision of her mother to evict her from the home. Withdrawal Symptoms:   Cramps Diaphoresis Diarrhea Headaches Nausea Tremors Vomiting Previous Psychotropic Medications: Yes  Psychological Evaluations: No  Past Medical History:  Past Medical History:  Diagnosis Date  . Bacterial vaginosis   . CHF (congestive heart failure) (HCC)   . Depression   . Gonorrhea   . Hypertension   . Psoriasis   . UTI (urinary tract infection)   . Vaginal Pap smear, abnormal     Past Surgical History:  Procedure Laterality Date  . CESAREAN SECTION N/A 10/21/2014   Procedure: CESAREAN SECTION;  Surgeon: Tracey Harries, MD;  Location: [facility];  Service: Obstetrics;  Laterality: N/A;  . NO PAST SURGERIES    . TOOTH EXTRACTION     Family History:  Family History  Problem Relation Age of Onset  . Arthritis Mother   . Depression Mother   . Hyperlipidemia Mother   . Learning disabilities Mother   . Mental illness Mother   . Varicose  Veins Mother   . Alcohol abuse Father   . Arthritis Father   . Asthma Father   . Cancer Father   . Drug abuse Father   . Diabetes Father   . Early death Father   . Heart disease Father   . Learning disabilities Sister   . Hyperlipidemia Maternal Grandmother   . Hyperlipidemia Maternal Grandfather    Family Psychiatric  History: Patient stated her mother had bipolar disorder. Tobacco Screening:   Social History:  Social History   Substance and Sexual Activity  Alcohol Use Yes  . Alcohol/week: 0.0 standard drinks   Comment: Rare     Social History   Substance and Sexual Activity  Drug Use Yes  . Types: Heroin    Additional Social History: Marital status: Long term relationship    Pain Medications: See MAR Prescriptions: hydroxyzine HCL  Tid; Citalopram  qd Over the Counter: See MAR History of alcohol / drug use?: Yes Name of Substance 1: (Pt reports that she took today.  Takes 1/2 gm daily) 1 - Amount (size/oz): varies 1 - Frequency: snorts q 2  weeks per pt; sister states pt & bf use $125 daily 1 - Duration: couple years 1 - Last Use / Amount: 2 weeks ago Name of Substance 2: (Hx- pt denies at this time.) 2 - Last Use / Amount: UTA; sister states short straws on table when picked pt up today Name of Substance 3: Suboxone 3 - Age of First Use: 28 3 - Amount (size/oz): varies 3 - Frequency: few x weekly 3 - Duration: ongoing 3 - Last Use / Amount: last week              Allergies:   Allergies  Allergen Reactions  . Lisinopril Cough   Lab Results:  Results for orders placed or performed during the hospital encounter of 01/03/19 (from the past 48 hour(s))  Pregnancy, urine     Status: None   Collection Time: 01/03/19  8:40 PM  Result Value Ref Range   Preg Test, Ur NEGATIVE NEGATIVE    Comment:        THE SENSITIVITY OF THIS METHODOLOGY IS >20 mIU/mL. Performed at [facility], [address], [place], [place] [address]    Urine rapid drug screen (hosp performed)not at [facility]     Status: Abnormal   Collection Time: 01/03/19  8:40 PM  Result Value Ref Range   Opiates NONE DETECTED NONE DETECTED   Cocaine NONE DETECTED NONE DETECTED   Benzodiazepines NONE DETECTED NONE DETECTED   Amphetamines NONE DETECTED NONE DETECTED   Tetrahydrocannabinol POSITIVE (A) NONE DETECTED   Barbiturates NONE DETECTED NONE DETECTED    Comment: (NOTE) DRUG SCREEN FOR MEDICAL PURPOSES ONLY.  IF CONFIRMATION IS NEEDED FOR ANY PURPOSE, NOTIFY LAB WITHIN 5 DAYS. LOWEST DETECTABLE LIMITS FOR URINE DRUG SCREEN Drug Class                     Cutoff (ng/mL) Amphetamine and metabolites    1000 Barbiturate and metabolites    200 Benzodiazepine                 200 Tricyclics and metabolites     300 Opiates and metabolites        300 Cocaine and metabolites        300 THC                            50 Performed at [facility], [address] [place], [place] [address]     Blood Alcohol level:  No results found for: [employer] [facility]  Metabolic Disorder Labs:  No results found for: HGBA1C, MPG No results found for: PROLACTIN Lab Results  Component Value Date   CHOL 157 12/19/2018   TRIG 77 12/19/2018   HDL 36 (L) 12/19/2018   CHOLHDL 4.4 12/19/2018   LDLCALC 106 (H) 12/19/2018    Current Medications: Current Facility-Administered Medications  Medication Dose Route Frequency Provider Last Rate Last Dose  . acetaminophen (TYLENOL) tablet 650 mg  650 mg Oral Q6H PRN Denzil Magnuson, NP      . alum & mag hydroxide-simeth (MAALOX/MYLANTA) 200-200-20 MG/5ML suspension 30 mL  30 mL Oral Q4H PRN Denzil Magnuson, NP      . citalopram (CELEXA) tablet 40 mg  40 mg Oral Daily Antonieta Pert, MD   40 mg at 01/04/19 0845  . cloNIDine (CATAPRES) tablet 0.1 mg  0.1 mg Oral QID Antonieta Pert, MD   0.1 mg at 01/04/19 0845   Followed  by  . Melene Muller ON 01/06/2019] cloNIDine (CATAPRES) tablet 0.1 mg  0.1 mg Oral BH-qamhs  Brandt Chaney, Marlane Mingle, MD       Followed by  . [START ON 01/08/2019] cloNIDine (CATAPRES) tablet 0.1 mg  0.1 mg Oral QAC breakfast Antonieta Pert, MD      . dicyclomine (BENTYL) tablet 20 mg  20 mg Oral Q6H PRN Antonieta Pert, MD      . hydrOXYzine (ATARAX/VISTARIL) tablet 25 mg  25 mg Oral TID PRN Jackelyn Poling, NP   25 mg at 01/04/19 0845  . loperamide (IMODIUM) capsule 2-4 mg  2-4 mg Oral PRN Antonieta Pert, MD      . methocarbamol (ROBAXIN) tablet 500 mg  500 mg Oral Q8H PRN Antonieta Pert, MD      . naproxen (NAPROSYN) tablet 500 mg  500 mg Oral BID PRN Antonieta Pert, MD      . nicotine polacrilex (NICORETTE) gum 2 mg  2 mg Oral PRN Cherly Beach, DO   2 mg at 01/03/19 2217  . ondansetron (ZOFRAN-ODT) disintegrating tablet 4 mg  4 mg Oral Q6H PRN Antonieta Pert, MD      . traZODone (DESYREL) tablet 100 mg  100 mg Oral QHS Nira Conn A, NP   100 mg at 01/03/19 2242   PTA Medications: Medications Prior to Admission  Medication Sig Dispense Refill Last Dose  . citalopram (CELEXA) 20 MG tablet Take 1 tablet (20 mg total) by mouth daily. 30 tablet 3 01/03/2019 at Unknown time  . hydrOXYzine (ATARAX/VISTARIL) 25 MG tablet TAKE 1 TABLET (25 MG TOTAL) BY MOUTH 3 (THREE) TIMES DAILY AS NEEDED. 60 tablet 0   . traZODone (DESYREL) 100 MG tablet Take 1 tablet (100 mg total) by mouth at bedtime. 90 tablet 2   . Vitamin D, Ergocalciferol, (DRISDOL) 1.25 MG (50000 UT) CAPS capsule Take 1 capsule (50,000 Units total) by mouth every 7 (seven) days. 12 capsule 1     Musculoskeletal: Strength & Muscle Tone: within normal limits Gait & Station: normal Patient leans: N/A  Psychiatric Specialty Exam: Physical Exam  Nursing note and vitals reviewed. Constitutional: She is oriented to person, place, and time. She appears well-developed and well-nourished.  HENT:  Head: Normocephalic and atraumatic.  Respiratory: Effort normal.  Neurological: She is alert and oriented to  person, place, and time.    ROS  Blood pressure 124/73, pulse 72, temperature 98.1 F (36.7 C), temperature source Oral, resp. rate 16, height  (1.676 m), weight 83 kg, SpO2 98 %.Body mass index is 29.54 kg/m.  General Appearance: Disheveled  Eye Contact:  Fair  Speech:  Normal Rate  Volume:  Normal  Mood:  Anxious, Depressed, Dysphoric and Irritable  Affect:  Congruent  Thought Process:  Coherent and Descriptions of Associations: Circumstantial  Orientation:  Full (Time, Place, and Person)  Thought Content:  Logical  Suicidal Thoughts:  No  Homicidal Thoughts:  No  Memory:  Immediate;   Fair Recent;   Fair Remote;   Fair  Judgement:  Impaired  Insight:  Lacking  Psychomotor Activity:  Increased  Concentration:  Concentration: Fair and Attention Span: Fair  Recall:  [employer] of Knowledge:  Fair  Language:  Fair  Akathisia:  Negative  Handed:  Right  AIMS (if indicated):     Assets:  Desire for Improvement Resilience  ADL's:  Intact  Cognition:  WNL  Sleep:       Treatment Plan  Summary: Daily contact with patient to assess and evaluate symptoms and progress in treatment, Medication management and Plan : Patient is seen and examined.  Patient is a 28 year old female with the above-stated past psychiatric history who was admitted secondary to a recent suicide attempt and continued depressive symptoms.  She also has a history of opiate dependence.  She will be admitted to the hospital.  She will be integrated into the milieu.  She will be encouraged to attend groups.  I will increase her citalopram to 40 mg p.o. daily.  I will also place her on the clonidine detox protocol.  She will be monitored for withdrawal symptoms.  She stated that she had used Xanax in the past, but nothing recently.  Unfortunately we do not have a drug screen.  That is been ordered for stat.  We will contact her significant other for collateral information.  She will also have available trazodone 100  mg p.o. nightly for sleep as well as hydroxyzine as needed for anxiety.  Review of her laboratories which were from 5/20 showed a mildly increased white blood cell count and otherwise normal.  Her drug screen was negative for all substances including opiates except for marijuana.  We will repeat her CBC during the course of the hospitalization.  Unfortunately liver function enzymes were not obtained, and I will order that as well.  Observation Level/Precautions:  Detox 15 minute checks  Laboratory:  Chemistry Profile  Psychotherapy:    Medications:    Consultations:    Discharge Concerns:    Estimated LOS:  Other:     Physician Treatment Plan for Primary Diagnosis: <principal problem not specified> Long Term Goal(s): Improvement in symptoms so as ready for discharge  Short Term Goals: Ability to identify changes in lifestyle to reduce recurrence of condition will improve, Ability to verbalize feelings will improve, Ability to disclose and discuss suicidal ideas, Ability to demonstrate self-control will improve, Ability to identify and develop effective coping behaviors will improve, Ability to maintain clinical measurements within normal limits will improve and Ability to identify triggers associated with substance abuse/mental health issues will improve  Physician Treatment Plan for Secondary Diagnosis: Active Problems:   MDD (major depressive disorder)  Long Term Goal(s): Improvement in symptoms so as ready for discharge  Short Term Goals: Ability to identify changes in lifestyle to reduce recurrence of condition will improve, Ability to verbalize feelings will improve, Ability to disclose and discuss suicidal ideas, Ability to demonstrate self-control will improve, Ability to identify and develop effective coping behaviors will improve, Ability to maintain clinical measurements within normal limits will improve and Ability to identify triggers associated with substance abuse/mental health  issues will improve  I certify that inpatient services furnished can reasonably be expected to improve the patient's condition.    Antonieta PertGreg Lawson Belina Mandile, MD 6/5/202010:30 AM

## 2019-01-04 NOTE — [facility] Counselor (Signed)
Adult Comprehensive Assessment  Patient ID: Lauren Sharp, female   DOB: 10/28/1990, 28 y.o.   MRN: [id number]  Information Source: Information source: Patient  Current Stressors:  Patient states their primary concerns and needs for treatment are:: "Doing something stupid. I tried to set carbon dioxide through my car" Patient states their goals for this hospitilization and ongoing recovery are:: "To be able to go home" Educational / Learning stressors: Pt denies stressors  Employment / Job issues: Pt denies stressors  Family Relationships: Pt denies stressors Financial / Lack of resources (include bankruptcy): Pt states that her mom is kicking her out of her house. Housing / Lack of housing: Pt is kicking out of the house. She has to find somewhere else to go.  Physical health (include injuries & life threatening diseases): Pt denies stressors  Social relationships: Pt denies stressors. Substance abuse: Pt report doing herion yesterday.  Bereavement / Loss: Pt denies stressors.  Living/Environment/Situation:  Living Arrangements: Parent, Spouse/significant other Living conditions (as described by patient or guardian): Pt reports her mother is kicking them out in 60 days.  Who else lives in the home?: Mom and boyfriend  How long has patient lived in current situation?: Whole life What is atmosphere in current home: Chaotic, Temporary  Family History:  Marital status: Long term relationship Long term relationship, how long?: 6 years What types of issues is patient dealing with in the relationship?: Pt denies no issues in relationship Are you sexually active?: Yes What is your sexual orientation?: Heterosexual Has your sexual activity been affected by drugs, alcohol, medication, or emotional stress?: No Does patient have children?: Yes How many children?: 1(son) How is patient's relationship with their children?: "It's good" (son is 28 years old)  Childhood History:  By whom was/is the  patient raised?: Both parents Description of patient's relationship with caregiver when they were a child: "It is hard to remember. Mom was always sick and depressed. Dad was always on drugs. Dad got clean. Then mom left. A mess" Patient's description of current relationship with people who raised him/her: Pt reports her dad is deceased and her relationship with her mother now is not good.  How were you disciplined when you got in trouble as a child/adolescent?: "I don't think I was" Does patient have siblings?: Yes Number of Siblings: 2(sisters) Description of patient's current relationship with siblings: "A lot better now" Did patient suffer any verbal/emotional/physical/sexual abuse as a child?: No Did patient suffer from severe childhood neglect?: No Has patient ever been sexually abused/assaulted/raped as an adolescent or adult?: No Was the patient ever a victim of a crime or a disaster?: No Witnessed domestic violence?: No Has patient been effected by domestic violence as an adult?: No  Education:  Highest grade of school patient has completed: 9th grade Currently a student?: No Learning disability?: Yes What learning problems does patient have?: ADHD  Employment/Work Situation:   Employment situation: Employed Where is patient currently employed?: [employer] How long has patient been employed?: a couple of years  Patient's job has been impacted by current illness: No What is the longest time patient has a held a job?: A couple of years Where was the patient employed at that time?: Current job - [employer]  Did You Receive Any Psychiatric Treatment/Services While in [employer]?: No Are There Guns or Other Weapons in Your Home?: No Are These Weapons Safely Secured?: Yes  Financial Resources:   Financial resources: Income from employment(boyfriend helps; no insurance) Does patient have  a representative payee or guardian?: No  Alcohol/Substance Abuse:   What has  been your use of drugs/alcohol within the last 12 months?: Pt reports doing herion the day before. Pt reports doing less than half a gram. Pt reports doing this for about a week straight.  If attempted suicide, did drugs/alcohol play a role in this?: No Alcohol/Substance Abuse Treatment Hx: Denies past history Has alcohol/substance abuse ever caused legal problems?: No  Social Support System:   Patient's Community Support System: Good Describe Community Support System: Boyfriend and sisters Type of faith/religion: None How does patient's faith help to cope with current illness?: N/A  Leisure/Recreation:   Leisure and Hobbies: Pt denies hobbies  Strengths/Needs:   What is the patient's perception of their strengths?: "I don't know really" Patient states they can use these personal strengths during their treatment to contribute to their recovery: N/A Patient states these barriers may affect/interfere with their treatment: N/A Patient states these barriers may affect their return to the community: N/A Other important information patient would like considered in planning for their treatment: N/A  Discharge Plan:   Currently receiving community mental health services: Yes (From Whom)([facility]) Patient states concerns and preferences for aftercare planning are: [facility] (pt reports an appointment on the 17th) Patient states they will know when they are safe and ready for discharge when: "I am ready to go now" Does patient have access to transportation?: Yes(boyfriend) Does patient have financial barriers related to discharge medications?: No Will patient be returning to same living situation after discharge?: Yes(with mom)  Summary/Recommendations:   Summary and Recommendations (to be completed by the evaluator): Pt is a 28 year old female with a past psychiatric history significant for depression as well as opiate dependence who presented as a walk-in patient  with suicidal ideation. Pt's diagnoses are: MDD single,severe without psychosis, Opioid dependence, and benzo abuse. Recommendations for pt include: crisis stabilization, therapeutic milieu, medication management, attend and participate in group therapy, and development of a comprehensive wellness plan.   Delphia Grates. 01/04/2019

## 2019-01-04 NOTE — [facility]HH Suicide Risk Assessment (Signed)
[facility]   Nursing information obtained from:  Patient Demographic factors:  Adolescent or young adult, Caucasian, Low socioeconomic status Current Mental Status:  Suicidal ideation indicated by patient, Suicidal ideation indicated by others, Suicide plan, Plan includes specific time, place, or method, Self-harm behaviors, Intention to act on suicide plan, Belief that plan would result in death Loss Factors:  NA Historical Factors:  Family history of mental illness or substance abuse Risk Reduction Factors:  Sense of responsibility to family, Living with another person, especially a relative, Positive therapeutic relationship  Total Time spent with patient: 30 minutes Principal Problem: <principal problem not specified> Diagnosis:  Active Problems:   MDD (major depressive disorder)  Subjective Data: Patient is seen and examined.  Patient is a 28 year old female with a past psychiatric history significant for depression as well as opiate dependence who presented as a walk-in patient to the behavioral health hospital on 01/03/2019 with suicidal ideation.  The patient came with her sister.  The patient apparently on 5/30 had stuffed a piece of cloth or clothing in the exhaust of her automobile, and sat in her car in an attempt to kill her self.  She stated that the biggest stressor she had was that her mother had recently told her that she was selling the house that the patient was living in.  The patient stated her boyfriend and child lived in at home.  The mother gave the patient 60 days to leave that house.  This overwhelmed her.  She stated that she had no previous psychiatric admissions.  She stated that she had been treated with antidepressants in the past per her family practice doctor.  Patient also admitted to heroin addiction.  She stated that her last use of heroin was on the date of admission.  She also admitted that she had taken buprenorphine off the street  previously.  She stated that she uses heroin basically on a daily basis.  She stated that the citalopram which had been prescribed by her primary medical doctor had helped in the past, but that the new stressors had overwhelmed her.  She was resistant to coming in the hospital, and so the patient was placed under involuntary commitment given the seriousness of her attempt.  She was admitted to the hospital for evaluation and stabilization.  Continued Clinical Symptoms:  Alcohol Use Disorder Identification Test Final Score (AUDIT): 0 The "Alcohol Use Disorders Identification Test", Guidelines for Use in Primary Care, Second Edition.  World Science writerHealth Organization [facility](WHO). Score between 0-7:  no or low risk or alcohol related problems. Score between 8-15:  moderate risk of alcohol related problems. Score between 16-19:  high risk of alcohol related problems. Score 20 or above:  warrants further diagnostic evaluation for alcohol dependence and treatment.   CLINICAL FACTORS:   Depression:   Aggression Anhedonia Comorbid alcohol abuse/dependence Hopelessness Impulsivity Insomnia Alcohol/Substance Abuse/Dependencies   Musculoskeletal: Strength & Muscle Tone: within normal limits Gait & Station: normal Patient leans: N/A  Psychiatric Specialty Exam: Physical Exam  Nursing note and vitals reviewed. Constitutional: She is oriented to person, place, and time. She appears well-developed and well-nourished.  HENT:  Head: Normocephalic and atraumatic.  Respiratory: Effort normal.  Neurological: She is alert and oriented to person, place, and time.    ROS  Blood pressure 124/73, pulse 72, temperature 98.1 F (36.7 C), temperature source Oral, resp. rate 16, height 5\' 6"  (1.676 m), weight 83 kg, SpO2 98 %.Body mass index is 29.54 kg/m.  General Appearance: Disheveled  Eye Contact:  Fair  Speech:  Normal Rate  Volume:  Decreased  Mood:  Anxious, Depressed and Dysphoric  Affect:  Congruent   Thought Process:  Coherent and Descriptions of Associations: Circumstantial  Orientation:  Full (Time, Place, and Person)  Thought Content:  Logical  Suicidal Thoughts:  Yes.  without intent/plan  Homicidal Thoughts:  No  Memory:  Immediate;   Fair Recent;   Fair Remote;   Fair  Judgement:  Impaired  Insight:  Lacking  Psychomotor Activity:  Increased and Restlessness  Concentration:  Concentration: Fair and Attention Span: Fair  Recall:  [employer] of Knowledge:  Good  Language:  Fair  Akathisia:  Negative  Handed:  Right  AIMS (if indicated):     Assets:  Desire for Improvement Resilience  ADL's:  Intact  Cognition:  WNL  Sleep:         COGNITIVE FEATURES THAT CONTRIBUTE TO RISK:  None    SUICIDE RISK:   Moderate:  Frequent suicidal ideation with limited intensity, and duration, some specificity in terms of plans, no associated intent, good self-control, limited dysphoria/symptomatology, some risk factors present, and identifiable protective factors, including available and accessible social support.  PLAN OF CARE: Patient is seen and examined.  Patient is a 28 year old female with the above-stated past psychiatric history who was admitted secondary to a recent suicide attempt and continued depressive symptoms.  She also has a history of opiate dependence.  She will be admitted to the hospital.  She will be integrated into the milieu.  She will be encouraged to attend groups.  I will increase her citalopram to 40 mg p.o. daily.  I will also place her on the clonidine detox protocol.  She will be monitored for withdrawal symptoms.  She stated that she had used Xanax in the past, but nothing recently.  Unfortunately we do not have a drug screen.  That is been ordered for stat.  We will contact her significant other for collateral information.  She will also have available trazodone 100 mg p.o. nightly for sleep as well as hydroxyzine as needed for anxiety.  Review of her  laboratories which were from 5/20 showed a mildly increased white blood cell count and otherwise normal.  Her drug screen was negative for all substances including opiates except for marijuana.  We will repeat her CBC during the course of the hospitalization.  Unfortunately liver function enzymes were not obtained, and I will order that as well.  I certify that inpatient services furnished can reasonably be expected to improve the patient's condition.   Antonieta Pert, MD 01/04/2019, 9:35 AM

## 2019-01-05 DIAGNOSIS — F322 Major depressive disorder, single episode, severe without psychotic features: Secondary | ICD-10-CM | POA: Diagnosis not present

## 2019-01-05 DIAGNOSIS — F1120 Opioid dependence, uncomplicated: Secondary | ICD-10-CM | POA: Diagnosis not present

## 2019-01-05 DIAGNOSIS — F1310 Sedative, hypnotic or anxiolytic abuse, uncomplicated: Secondary | ICD-10-CM | POA: Diagnosis not present

## 2019-01-05 MED ORDER — CITALOPRAM HYDROBROMIDE 40 MG PO TABS: 40.0000 mg | ORAL_TABLET | Freq: Every day | ORAL | 0 refills | Status: DC

## 2019-01-05 MED ORDER — HYDROXYZINE HCL 25 MG PO TABS: 25.0000 mg | ORAL_TABLET | Freq: Three times a day (TID) | ORAL | 0 refills | Status: DC | PRN

## 2019-01-05 MED ORDER — TRAZODONE HCL 100 MG PO TABS: 100.0000 mg | ORAL_TABLET | Freq: Every day | ORAL | 0 refills | Status: DC

## 2019-01-05 NOTE — BHH Suicide Risk Assessment (Signed)
[facility] INPATIENT:  Family/Significant Other Suicide Prevention Education  Suicide Prevention Education:  Education Completed; Nicoletta Ba [phone],  (name of family member/significant other) has been identified by the patient as the family member/significant other with whom the patient will be residing, and identified as the person(s) who will aid the patient in the event of a mental health crisis (suicidal ideations/suicide attempt).  With written consent from the patient, the family member/significant other has been provided the following suicide prevention education, prior to the and/or following the discharge of the patient.  The suicide prevention education provided includes the following:  Suicide risk factors  Suicide prevention and interventions  National Suicide Hotline telephone number  [facility] assessment telephone number  [facility] Emergency Assistance [place] and/or Residential Mobile Crisis Unit telephone number  Request made of family/significant other to:  Remove weapons (e.g., guns, rifles, knives), all items previously/currently identified as safety concern.    Remove drugs/medications (over-the-counter, prescriptions, illicit drugs), all items previously/currently identified as a safety concern.  The family member/significant other verbalizes understanding of the suicide prevention education information provided.  The family member/significant other agrees to remove the items of safety concern listed above.  Berlin Hun Grossman-Orr 01/05/2019, 9:13 AM

## 2019-01-05 NOTE — Discharge Summary (Signed)
Physician Discharge Summary Note  Patient:  Lauren Sharp is an 28 y.o., female MRN:  [id number] DOB:  08/21/1990 Patient phone:  [phone] (home)  Patient address:   [address] [place] [id number],  Total Time spent with patient: 20 minutes  Date of Admission:  01/03/2019 Date of Discharge: 01/05/19  Reason for Admission:  Worsening depression with SI  Principal Problem: MDD (major depressive disorder), severe (HCC) Discharge Diagnoses: Principal Problem:   MDD (major depressive disorder), severe (HCC)   Past Psychiatric History: Patient denied any formal psychiatric treatment in the past.  She has received antidepressant medicines from her primary care provider.  Most recently it has been Celexa, and she was previously treated with 1 other antidepressant of which she is unable to recall that.  She has a history of opiate dependence which is active with heroin use.  She also had previously used benzodiazepines off the street  Past Medical History:  Past Medical History:  Diagnosis Date  . Bacterial vaginosis   . CHF (congestive heart failure) (HCC)   . Depression   . Gonorrhea   . Hypertension   . Psoriasis   . UTI (urinary tract infection)   . Vaginal Pap smear, abnormal     Past Surgical History:  Procedure Laterality Date  . CESAREAN SECTION N/A 10/21/2014   Procedure: CESAREAN SECTION;  Surgeon: Tracey Harrieshomas Henley, MD;  Location: [facility];  Service: Obstetrics;  Laterality: N/A;  . NO PAST SURGERIES    . TOOTH EXTRACTION     Family History:  Family History  Problem Relation Age of Onset  . Arthritis Mother   . Depression Mother   . Hyperlipidemia Mother   . Learning disabilities Mother   . Mental illness Mother   . Varicose Veins Mother   . Alcohol abuse Father   . Arthritis Father   . Asthma Father   . Cancer Father   . Drug abuse Father   . Diabetes Father   . Early death Father   . Heart disease Father   . Learning disabilities Sister   .  Hyperlipidemia Maternal Grandmother   . Hyperlipidemia Maternal Grandfather    Family Psychiatric  History: Patient stated her mother had bipolar disorder Social History:  Social History   Substance and Sexual Activity  Alcohol Use Yes  . Alcohol/week: 0.0 standard drinks   Comment: Rare     Social History   Substance and Sexual Activity  Drug Use Yes  . Types: Heroin    Social History   Socioeconomic History  . Marital status: Single    Spouse name: Not on file  . Number of children: Not on file  . Years of education: Not on file  . Highest education level: Not on file  Occupational History  . Not on file  Social Needs  . Financial resource strain: Not on file  . Food insecurity:    Worry: Not on file    Inability: Not on file  . Transportation needs:    Medical: Not on file    Non-medical: Not on file  Tobacco Use  . Smoking status: Current Every Day Smoker    Packs/day: 1.00    Years: 10.00    Pack years: 10.00    Types: Cigarettes  . Smokeless tobacco: Never Used  Substance and Sexual Activity  . Alcohol use: Yes    Alcohol/week: 0.0 standard drinks    Comment: Rare  . Drug use: Yes    Types: Heroin  .  Sexual activity: Yes    Birth control/protection: Injection  Lifestyle  . Physical activity:    Days per week: Not on file    Minutes per session: Not on file  . Stress: Not on file  Relationships  . Social connections:    Talks on phone: Not on file    Gets together: Not on file    Attends religious service: Not on file    Active member of club or organization: Not on file    Attends meetings of clubs or organizations: Not on file    Relationship status: Not on file  Other Topics Concern  . Not on file  Social History Narrative  . Not on file    Hospital Course:   01/04/19 [facility] MD Assessment: 28 year old female with a past psychiatric history significant for depression as well as opiate dependence who presented as a walk-in patient to the  behavioral health hospital on 01/03/2019 with suicidal ideation. The patient came with her sister. The patient apparently on 5/30 had stuffed a piece of cloth or clothing in the exhaust of her automobile, and sat in her car in an attempt to kill her self. She stated that the biggest stressor she had was that her mother had recently told her that she was selling the house that the patient was living in. The patient stated her boyfriend and child lived in at home. The mother gave the patient 60 days to leave that house. This overwhelmed her. She stated that she had no previous psychiatric admissions. She stated that she had been treated with antidepressants in the past per her family practice doctor. Patient also admitted to heroin addiction. She stated that her last use of heroin was on the date of admission. She also admitted that she had taken buprenorphine off the street previously. She stated that she uses heroin basically on a daily basis. She stated that the citalopram which had been prescribed by her primary medical doctor had helped in the past, but that the new stressors had overwhelmed her. She was resistant to coming in the hospital, and so the patient was placed under involuntary commitment given the seriousness of her attempt. She was admitted to the hospital for evaluation and stabilization.  Patient remained on the [facility] unit for 1 days. The patient stabilized on medication and therapy. Patient was discharged on Celexa 40 mg Daily, Vistaril 25 mg Q6H PRN, and Trazodone 100 mg QHS PRN. Patient has shown improvement with improved mood, affect, sleep, appetite, and interaction. Patient has attended group and participated. Patient has been seen in the day room interacting with peers and staff appropriately. Patient denies any SI/HI/AVH and contracts for safety. Patient agrees to follow up at [facility]. Patient is provided with prescriptions for their medications upon  discharge.   Physical Findings: AIMS: Facial and Oral Movements Muscles of Facial Expression: None, normal Lips and Perioral Area: None, normal Jaw: None, normal Tongue: None, normal,Extremity Movements Upper (arms, wrists, hands, fingers): None, normal Lower (legs, knees, ankles, toes): None, normal, Trunk Movements Neck, shoulders, hips: None, normal, Overall Severity Severity of abnormal movements (highest score from questions above): None, normal Incapacitation due to abnormal movements: None, normal Patient's awareness of abnormal movements (rate only patient's report): No Awareness, Dental Status Current problems with teeth and/or dentures?: No Does patient usually wear dentures?: No  CIWA:  CIWA-Ar Total: 0 COWS:  COWS Total Score: 1  Musculoskeletal: Strength & Muscle Tone: within normal limits Gait & Station:  normal Patient leans: N/A  Psychiatric Specialty Exam: Physical Exam  Nursing note and vitals reviewed. Constitutional: She is oriented to person, place, and time. She appears well-developed and well-nourished.  Cardiovascular: Normal rate.  Respiratory: Effort normal.  Musculoskeletal: Normal range of motion.  Neurological: She is alert and oriented to person, place, and time.  Skin: Skin is warm.    Review of Systems  Constitutional: Negative.   HENT: Negative.   Eyes: Negative.   Respiratory: Negative.   Cardiovascular: Negative.   Gastrointestinal: Negative.   Genitourinary: Negative.   Musculoskeletal: Negative.   Skin: Negative.   Neurological: Negative.   Endo/Heme/Allergies: Negative.   Psychiatric/Behavioral: Negative.     Blood pressure 100/71, pulse 72, temperature 98.6 F (37 C), temperature source Oral, resp. rate 18, height 5\' 6"  (1.676 m), weight 83 kg, SpO2 98 %.Body mass index is 29.54 kg/m.  General Appearance: Casual  Eye Contact:  Good  Speech:  Clear and Coherent and Normal Rate  Volume:  Normal  Mood:  Euthymic  Affect:   Congruent  Thought Process:  Coherent and Descriptions of Associations: Intact  Orientation:  Full (Time, Place, and Person)  Thought Content:  WDL  Suicidal Thoughts:  No  Homicidal Thoughts:  No  Memory:  Immediate;   Good Recent;   Good Remote;   Good  Judgement:  Good  Insight:  Good  Psychomotor Activity:  Normal  Concentration:  Concentration: Good and Attention Span: Good  Recall:  Good  Fund of Knowledge:  Good  Language:  Good  Akathisia:  No  Handed:  Right  AIMS (if indicated):     Assets:  Communication Skills Desire for Improvement Financial Resources/Insurance Housing Physical Health Social Support Transportation  ADL's:  Intact  Cognition:  WNL  Sleep:  Number of Hours: 6.75        Has this patient used any form of tobacco in the last 30 days? (Cigarettes, Smokeless Tobacco, Cigars, and/or Pipes) Yes, No  Blood Alcohol level:  No results found for: [facility]  Metabolic Disorder Labs:  No results found for: HGBA1C, MPG No results found for: PROLACTIN Lab Results  Component Value Date   CHOL 157 12/19/2018   TRIG 77 12/19/2018   HDL 36 (L) 12/19/2018   CHOLHDL 4.4 12/19/2018   LDLCALC 106 (H) 12/19/2018    See Psychiatric Specialty Exam and Suicide Risk Assessment completed by Attending Physician prior to discharge.  Discharge destination:  Home  Is patient on multiple antipsychotic therapies at discharge:  No   Has Patient had three or more failed trials of antipsychotic monotherapy by history:  No  Recommended Plan for Multiple Antipsychotic Therapies: NA   Allergies as of 01/05/2019      Reactions   Lisinopril Cough      Medication List    STOP taking these medications   Vitamin D (Ergocalciferol) 1.25 MG (50000 UT) Caps capsule Commonly known as:  DRISDOL     TAKE these medications     Indication  citalopram 40 MG tablet Commonly known as:  CELEXA Take 1 tablet (40 mg total) by mouth daily. Start taking on:  January 06, 2019 What  changed:    medication strength  how much to take  Indication:  Major Depressive disorder   hydrOXYzine 25 MG tablet Commonly known as:  ATARAX/VISTARIL Take 1 tablet (25 mg total) by mouth 3 (three) times daily as needed.  Indication:  Feeling Anxious   traZODone 100 MG tablet Commonly known as:  DESYREL Take 1 tablet (100 mg total) by mouth at bedtime.  Indication:  Trouble Sleeping      Follow-up Information    [facility] Follow up.   Contact information: [address] [place] [id number] [phone]          Follow-up recommendations:  Continue activity as tolerated. Continue diet as recommended by your PCP. Ensure to keep all appointments with outpatient providers.  Comments:  Patient is instructed prior to discharge to: Take all medications as prescribed by his/her mental healthcare provider. Report any adverse effects and or reactions from the medicines to his/her outpatient provider promptly. Patient has been instructed & cautioned: To not engage in alcohol and or illegal drug use while on prescription medicines. In the event of worsening symptoms, patient is instructed to call the crisis hotline, 911 and or go to the nearest ED for appropriate evaluation and treatment of symptoms. To follow-up with his/her primary care provider for your other medical issues, concerns and or health care needs.    Signed: Gerlene Burdockravis B Hurbert Duran, FNP 01/05/2019, 8:49 AM

## 2019-01-05 NOTE — Progress Notes (Signed)
Pt discharged to lobby- boyfriend waiting in car to pick up patient. Pt was stable and appreciative at that time. All papers and samples were given- no valuables to return, except clothes patient was wearing Verbal understanding expressed. Denies SI/HI and A/VH. Pt given opportunity to express concerns and ask questions.

## 2019-01-05 NOTE — BHH Group Notes (Signed)
[place] LCSW Group Therapy Note  01/05/2019  10:00-11:00AM  Type of Therapy and Topic:  Group Therapy - Accepting We Are All Damaged People  Participation Level:  Active   Description of Group:  Patients in this group were asked to share whether they feel that they are "damaged" and explain their responses.  A song entitled "Damaged People" was then played, followed by a discussion of the relevance/relatedness of this song to each patient.   The conclusion of the group was that our goal as humans does not need to be perfection, but rather growth.  Insights among group members were shared, including that it is easy to point the fingers at others as being damaged, but actually we need to realize that we also are flawed humans with problems to overcome.  The group concluded with an emphasis on how this is ultimately a message of hope that we face struggles like every other person in the world, and that we are not alone.  Therapeutic Goals: 1)  introduce the concept of pain and hardship being universal  2)  connect emotionally to a musical message and to other group members  3)  identify the patient's current beliefs about their own broken methods of resolving their life problems to date, specifically related to this hospitalization  4)  allow time and space for patients to vent their pain and receive support from other patients  5)  elicit hope that arises from realizing we are not alone in our human struggles   Summary of Patient Progress:  The patient expressed that she does feel she is "damaged," because her father died when she was 35yo and that has affected her whole life.  She participated when called on, was attentive throughout group.  Her affect was blunted and her mood was depressed.  Therapeutic Modalities:   Motivational Interviewing Activity  Maretta Los  01/05/2019 11:22 AM

## 2019-01-05 NOTE — Progress Notes (Signed)
[place] NOVEL CORONAVIRUS (COVID-19) DAILY CHECK-OFF SYMPTOMS - answer yes or no to each - every day NO YES  Have you had a fever in the past 24 hours?  . Fever (Temp > 37.80C / 100F) X   Have you had any of these symptoms in the past 24 hours? . New Cough .  Sore Throat  .  Shortness of Breath .  Difficulty Breathing .  Unexplained Body Aches   X   Have you had any one of these symptoms in the past 24 hours not related to allergies?   . Runny Nose .  Nasal Congestion .  Sneezing   X   If you have had runny nose, nasal congestion, sneezing in the past 24 hours, has it worsened?  X   EXPOSURES - check yes or no X   Have you traveled outside the state in the past 14 days?  X   Have you been in contact with someone with a confirmed diagnosis of COVID-19 or PUI in the past 14 days without wearing appropriate PPE?  X   Have you been living in the same home as a person with confirmed diagnosis of COVID-19 or a PUI (household contact)?    X   Have you been diagnosed with COVID-19?    X              What to do next: Answered NO to all: Answered YES to anything:   Proceed with unit schedule Follow the [facility] Inpatient Flowsheet.   

## 2019-01-05 NOTE — Progress Notes (Signed)
  [facility] Adult Case Management Discharge Plan :  Will you be returning to the same living situation after discharge:  Yes,  temporarily back to mother's At discharge, do you have transportation home?: Yes,  arranged by patient Do you have the ability to pay for your medications: No.  No insurance  Release of information consent forms completed and turned in to Medical Records by CSW.   Patient to Follow up at: Follow-up [facility] Follow up.   Why:  You have stated you have an upcoming appointment on 6/17.  Please call the clinic to confirm the date and time of that appointment.  The hospital is forwarding your records to them.  Thank you. Contact information: [address] [place] [address] [phone]          Next level of care provider has access to Banks and Suicide Prevention discussed: Yes,  with sister Nicoletta Ba [phone]     Has patient been referred to the Quitline?: Patient refused referral  Patient has been referred for addiction treatment: Yes  Maretta Los, LCSW 01/05/2019, 9:16 AM

## 2019-01-05 NOTE — Progress Notes (Signed)
D. Pt is friendly upon approach- reports that she is looking forward to getting home to see her son. Per pt's self inventory, pt rates her depression, hopelessness and anxiety a 2/0/0, respectively. Pt currently denies SI/HI and AVH A. Labs and vitals monitored. Pt compliant with medications. Pt supported emotionally and encouraged to express concerns and ask questions.   R. Pt remains safe with 15 minute checks. Will continue POC.

## 2019-01-05 NOTE — [facility] Suicide Risk Assessment (Signed)
[facility] Discharge Suicide Risk Assessment   Principal Problem: <principal problem not specified> Discharge Diagnoses: Active Problems:   MDD (major depressive disorder)   Total Time spent with patient: 15 minutes  Musculoskeletal: Strength & Muscle Tone: within normal limits Gait & Station: normal Patient leans: N/A  Psychiatric Specialty Exam: Review of Systems  All other systems reviewed and are negative.   Blood pressure 118/76, pulse 72, temperature 98.6 F (37 C), temperature source Oral, resp. rate 18, height 5\' 6"  (1.676 m), weight 83 kg, SpO2 98 %.Body mass index is 29.54 kg/m.  General Appearance: Casual  Eye Contact::  Fair  Speech:  Normal Rate409  Volume:  Normal  Mood:  Anxious  Affect:  Congruent  Thought Process:  Coherent and Descriptions of Associations: Intact  Orientation:  Full (Time, Place, and Person)  Thought Content:  Logical  Suicidal Thoughts:  No  Homicidal Thoughts:  No  Memory:  Immediate;   Fair Recent;   Fair Remote;   Fair  Judgement:  Intact  Insight:  Fair  Psychomotor Activity:  Normal  Concentration:  Fair  Recall:  [employer] of Knowledge:Fair  Language: Good  Akathisia:  Negative  Handed:  Right  AIMS (if indicated):     Assets:  Desire for Improvement Resilience  Sleep:  Number of Hours: 6.75  Cognition: WNL  ADL's:  Intact   Mental Status Per Nursing Assessment::   On Admission:  Suicidal ideation indicated by patient, Suicidal ideation indicated by others, Suicide plan, Plan includes specific time, place, or method, Self-harm behaviors, Intention to act on suicide plan, Belief that plan would result in death  Demographic Factors:  Caucasian  Loss Factors: NA  Historical Factors: Family history of mental illness or substance abuse and Impulsivity  Risk Reduction Factors:   Responsible for children under 72 years of age, Sense of responsibility to family, Employed, Living with another person, especially a relative and  Positive social support  Continued Clinical Symptoms:  Depression:   Comorbid alcohol abuse/dependence Impulsivity Alcohol/Substance Abuse/Dependencies  Cognitive Features That Contribute To Risk:  None    Suicide Risk:  Minimal: No identifiable suicidal ideation.  Patients presenting with no risk factors but with morbid ruminations; may be classified as minimal risk based on the severity of the depressive symptoms  Follow-up [facility] Follow up.   Contact information: [place] [address] [phone]          Plan Of Care/Follow-up recommendations:  Activity:  ad lib  Sharma Covert, MD 01/05/2019, 7:48 AM

## 2019-01-07 MED FILL — traZODone HCL 100 MG TABS: 100 | 30 days supply | Qty: 30 | Fill #0

## 2019-01-07 MED FILL — hydrOXYzine HCL 25 MG TABS: 25 | 10 days supply | Qty: 30 | Fill #0

## 2019-01-07 MED FILL — ?CITALOPRAM HBR 40 MG TABLE: 40 | 30 days supply | Qty: 30 | Fill #0

## 2019-01-07 NOTE — Progress Notes (Signed)
CMA attempt to reach patient to inform on PCP advising.  No answer and left a VM.  

## 2019-01-16 ENCOUNTER — Other Ambulatory Visit: Payer: Self-pay

## 2019-01-16 ENCOUNTER — Other Ambulatory Visit: Payer: Medicaid Other

## 2019-01-16 ENCOUNTER — Ambulatory Visit: Payer: Self-pay | Attending: Nurse Practitioner | Admitting: Nurse Practitioner

## 2019-01-23 ENCOUNTER — Ambulatory Visit: Payer: Self-pay | Attending: Nurse Practitioner

## 2019-01-23 ENCOUNTER — Other Ambulatory Visit: Payer: Self-pay

## 2019-01-23 DIAGNOSIS — R5383 Other fatigue: Secondary | ICD-10-CM

## 2019-01-23 MED FILL — ?CITALOPRAM HBR 20 MG TABLE: 20 | 30 days supply | Qty: 30 | Fill #0

## 2019-01-23 MED FILL — hydrOXYzine HCL 25 MG TABS: 25 | 20 days supply | Qty: 60 | Fill #0

## 2019-01-23 MED FILL — traZODone HCL 100 MG TABS: 100 | 30 days supply | Qty: 30 | Fill #0

## 2019-01-24 LAB — CBC WITH DIFFERENTIAL/PLATELET
Basophils Absolute: 0 10*3/uL (ref 0.0–0.2)
Basos: 0 %
EOS (ABSOLUTE): 0.3 10*3/uL (ref 0.0–0.4)
Eos: 4 %
Hematocrit: 39.7 % (ref 34.0–46.6)
Hemoglobin: 13.4 g/dL (ref 11.1–15.9)
Immature Grans (Abs): 0 10*3/uL (ref 0.0–0.1)
Immature Granulocytes: 0 %
Lymphocytes Absolute: 1.4 10*3/uL (ref 0.7–3.1)
Lymphs: 20 %
MCH: 29.8 pg (ref 26.6–33.0)
MCHC: 33.8 g/dL (ref 31.5–35.7)
MCV: 88 fL (ref 79–97)
Monocytes Absolute: 0.5 10*3/uL (ref 0.1–0.9)
Monocytes: 7 %
Neutrophils Absolute: 4.9 10*3/uL (ref 1.4–7.0)
Neutrophils: 69 %
Platelets: 197 10*3/uL (ref 150–450)
RBC: 4.49 x10E6/uL (ref 3.77–5.28)
RDW: 13.1 % (ref 11.7–15.4)
WBC: 7.1 10*3/uL (ref 3.4–10.8)

## 2019-02-05 ENCOUNTER — Telehealth: Payer: Self-pay

## 2019-02-05 NOTE — Telephone Encounter (Signed)
CMA attempt to reach patient to inform on results.  No answer and left a VM to call back .

## 2019-02-05 NOTE — Telephone Encounter (Signed)
-----   Message from Gildardo Pounds, NP sent at 01/31/2019 11:38 PM EDT ----- WBC is now normal and labs do not show any anemia.

## 2019-03-05 ENCOUNTER — Ambulatory Visit: Payer: Self-pay | Attending: Nurse Practitioner | Admitting: Nurse Practitioner

## 2019-03-05 ENCOUNTER — Other Ambulatory Visit: Payer: Self-pay

## 2019-03-06 ENCOUNTER — Telehealth: Payer: Self-pay | Admitting: Nurse Practitioner

## 2019-03-06 NOTE — Telephone Encounter (Signed)
1) Medication(s) Requested (by name): Hydroxyzine Trazodone celexa 2) Pharmacy of Choice: [facility] 3) Special Requests:   Approved medications will be sent to the pharmacy, we will reach out if there is an issue.  Requests made after 3pm may not be addressed until the following business day!  If a patient is unsure of the name of the medication(s) please note and ask patient to call back when they are able to provide all info, do not send to responsible party until all information is available!

## 2019-03-08 NOTE — Telephone Encounter (Signed)
Pt had refills with the pharmacy, refills were submitted.

## 2020-05-04 ENCOUNTER — Encounter (HOSPITAL_COMMUNITY): Payer: Self-pay

## 2020-05-04 ENCOUNTER — Other Ambulatory Visit: Payer: Self-pay

## 2020-05-04 ENCOUNTER — Ambulatory Visit (HOSPITAL_COMMUNITY)
Admission: EM | Admit: 2020-05-04 | Discharge: 2020-05-04 | Disposition: A | Discharge status: Home / Self Care | Payer: Medicaid Other | Attending: Family Medicine | Admitting: Family Medicine

## 2020-05-04 DIAGNOSIS — K047 Periapical abscess without sinus: Secondary | ICD-10-CM

## 2020-05-04 MED ORDER — CLINDAMYCIN HCL 300 MG PO CAPS: 300.0000 mg | ORAL_CAPSULE | Freq: Two times a day (BID) | ORAL | 0 refills | Status: DC

## 2020-05-04 MED ORDER — KETOROLAC TROMETHAMINE 60 MG/2ML IM SOLN
60.0000 mg | Freq: Once | INTRAMUSCULAR | Status: AC
  Administered 2020-05-04: 60 mg via INTRAMUSCULAR

## 2020-05-04 MED ORDER — KETOROLAC TROMETHAMINE 60 MG/2ML IM SOLN
INTRAMUSCULAR | Status: AC
  Filled 2020-05-04: qty 2

## 2020-05-04 MED ORDER — LIDOCAINE VISCOUS HCL 2 % MT SOLN: 15.0000 mL | OROMUCOSAL | 0 refills | Status: DC | PRN

## 2020-05-04 NOTE — Discharge Instructions (Addendum)
Follow up with a dentist as soon as you are able.

## 2020-05-04 NOTE — ED Triage Notes (Signed)
Pt presents with right side dental pain since yesterday. 

## 2020-05-04 NOTE — ED Provider Notes (Signed)
[facility]    CSN: [id number] Arrival date & time: 05/04/20  1608      History   Chief Complaint Chief Complaint  Patient presents with  . Dental Pain    HPI Lauren Sharp is a 29 y.o. female.   Presenting today with severe sudden onset right upper jaw pain since yesterday morning that has progressively worsened. She states she has not seen a dentist in a long time and knows she has numerous areas of decay in her mouth but does not have insurance or means to go. She denies fever, chills, difficulty swallowing or breathing, hx of similar issues. Has tried salt water gargles and tylenol without relief.      Past Medical History:  Diagnosis Date  . Bacterial vaginosis   . CHF (congestive heart failure) (HCC)   . Depression   . Gonorrhea   . Hypertension   . Psoriasis   . UTI (urinary tract infection)   . Vaginal Pap smear, abnormal     Patient Active Problem List   Diagnosis Date Noted  . MDD (major depressive disorder), severe (HCC) 01/03/2019  . Essential hypertension 11/28/2018  . Anxiety and depression 11/28/2018  . IUD contraception 08/09/2016  . Acute systolic CHF (congestive heart failure) (HCC) 10/27/2014  . 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 Hilar adenopathy 10/27/2014  . H/O cesarean section 10/21/2014    Past Surgical History:  Procedure Laterality Date  . CESAREAN SECTION N/A 10/21/2014   Procedure: CESAREAN SECTION;  Surgeon: Tracey Harries, MD;  Location: [facility];  Service: Obstetrics;  Laterality: N/A;  . NO PAST SURGERIES    . TOOTH EXTRACTION      OB History    Gravida  1   Para  1   Term      Preterm  1   AB      Living  1     SAB      TAB      Ectopic      Multiple  0   Live Births  1            Home Medications    Prior to Admission medications   Medication Sig Start Date End Date Taking? Authorizing Provider  citalopram (CELEXA) 40 MG tablet Take 1 tablet (40 mg total) by mouth daily.  01/06/19   Money, Gerlene Burdock, FNP  clindamycin (CLEOCIN) 300 MG capsule Take 1 capsule (300 mg total) by mouth 2 (two) times daily. 05/04/20   Particia Nearing, PA-C  hydrOXYzine (ATARAX/VISTARIL) 25 MG tablet Take 1 tablet (25 mg total) by mouth 3 (three) times daily as needed. 01/05/19   Money, Gerlene Burdock, FNP  lidocaine (XYLOCAINE) 2 % solution Use as directed 15 mLs in the mouth or throat as needed for mouth pain. 05/04/20   Particia Nearing, PA-C  traZODone (DESYREL) 100 MG tablet Take 1 tablet (100 mg total) by mouth at bedtime. 01/05/19 04/05/19  Money, Gerlene Burdock, FNP    Family History Family History  Problem Relation Age of Onset  . Arthritis Mother   . Depression Mother   . Hyperlipidemia Mother   . Learning disabilities Mother   . Mental illness Mother   . Varicose Veins Mother   . Alcohol abuse Father   . Arthritis Father   . Asthma Father   . Cancer Father   . Drug abuse Father   . Diabetes Father   . Early death Father   . Heart disease Father   .  Learning disabilities Sister   . Hyperlipidemia Maternal Grandmother   . Hyperlipidemia Maternal Grandfather     Social History Social History   Tobacco Use  . Smoking status: Current Every Day Smoker    Packs/day: 1.00    Years: 10.00    Pack years: 10.00    Types: Cigarettes  . Smokeless tobacco: Never Used  Vaping Use  . Vaping Use: Never used  Substance Use Topics  . Alcohol use: Yes    Alcohol/week: 0.0 standard drinks    Comment: Rare  . Drug use: Yes    Types: Heroin     Allergies   Lisinopril   Review of Systems Review of Systems PER HPI   Physical Exam Triage Vital Signs ED Triage Vitals [05/04/20 1745]  Enc Vitals Group     BP (!) 177/100     Pulse Rate 76     Resp 20     Temp 98.2 F (36.8 C)     Temp Source Oral     SpO2 99 %     Weight      Height      Head Circumference      Peak Flow      Pain Score 10     Pain Loc      Pain Edu?      Excl. in GC?    No data  found.  Updated Vital Signs BP (!) 177/100 (BP Location: Right Arm)   Pulse 76   Temp 98.2 F (36.8 C) (Oral)   Resp 20   SpO2 99%   Visual Acuity Right Eye Distance:   Left Eye Distance:   Bilateral Distance:    Right Eye Near:   Left Eye Near:    Bilateral Near:     Physical Exam Vitals and nursing note reviewed.  Constitutional:      Appearance: Normal appearance. She is not ill-appearing.  HENT:     Head: Atraumatic.     Mouth/Throat:     Mouth: Mucous membranes are moist.     Pharynx: Posterior oropharyngeal erythema present.     Comments: Very poor dentition, numerous areas of decay diffusely and 1 cm diameter abscess inner gumline upper right side of jaw, no active drainage Eyes:     Extraocular Movements: Extraocular movements intact.     Conjunctiva/sclera: Conjunctivae normal.  Cardiovascular:     Rate and Rhythm: Normal rate and regular rhythm.     Heart sounds: Normal heart sounds.  Pulmonary:     Effort: Pulmonary effort is normal.     Breath sounds: Normal breath sounds.  Musculoskeletal:        General: Normal range of motion.     Cervical back: Normal range of motion and neck supple.  Lymphadenopathy:     Cervical: Cervical adenopathy present.  Skin:    General: Skin is warm and dry.  Neurological:     Mental Status: She is alert and oriented to person, place, and time.  Psychiatric:        Mood and Affect: Mood normal.        Thought Content: Thought content normal.        Judgment: Judgment normal.    UC Treatments / Results  Labs (all labs ordered are listed, but only abnormal results are displayed) Labs Reviewed - No data to display  EKG   Radiology No results found.  Procedures Procedures (including critical care time)  Medications Ordered in UC Medications  ketorolac (TORADOL)  injection 60 mg (60 mg Intramuscular Given 05/04/20 1821)    Initial Impression / Assessment and Plan / UC Course  I have reviewed the triage vital  signs and the nursing notes.  Pertinent labs & imaging results that were available during my care of the patient were reviewed by me and considered in my medical decision making (see chart for details).     Significant dental abscess, with poorly controlled pain. IM toradol given, will also give viscous lidocaine for prn use. Start clindamycin immediately and follow up as soon as possible with a dentist. ER precautions given if worsening at any point. OTC NSAIDs for pain relief prn. WOrk note also given today.    Final Clinical Impressions(s) / UC Diagnoses   Final diagnoses:  Dental infection     Discharge Instructions     Follow up with a dentist as soon as you are able.     ED Prescriptions    Medication Sig Dispense Auth. Provider   lidocaine (XYLOCAINE) 2 % solution Use as directed 15 mLs in the mouth or throat as needed for mouth pain. 100 mL Particia Nearing, PA-C   clindamycin (CLEOCIN) 300 MG capsule Take 1 capsule (300 mg total) by mouth 2 (two) times daily. 14 capsule Particia Nearing, [place]     PDMP not reviewed this encounter.   Particia Nearing, [place] 05/04/20 1824

## 2020-05-05 ENCOUNTER — Telehealth (HOSPITAL_COMMUNITY): Payer: Self-pay | Admitting: Emergency Medicine

## 2020-05-05 MED ORDER — CLINDAMYCIN HCL 300 MG PO CAPS: 300.0000 mg | ORAL_CAPSULE | Freq: Two times a day (BID) | ORAL | 0 refills | Status: DC

## 2020-05-05 NOTE — Telephone Encounter (Signed)
Spoke to patient after verifying name and dob.  Confirmed script was sent to [address] .  Checked epic for confirmed receipt and it is present

## 2021-04-12 ENCOUNTER — Ambulatory Visit: Payer: Medicaid Other | Admitting: Family Medicine

## 2022-03-10 ENCOUNTER — Ambulatory Visit (HOSPITAL_COMMUNITY)
Admission: EM | Admit: 2022-03-10 | Discharge: 2022-03-10 | Disposition: A | Discharge status: Home / Self Care | Payer: Commercial Managed Care - HMO | Attending: Emergency Medicine | Admitting: Emergency Medicine

## 2022-03-10 ENCOUNTER — Encounter (HOSPITAL_COMMUNITY): Payer: Self-pay | Admitting: Emergency Medicine

## 2022-03-10 DIAGNOSIS — L400 Psoriasis vulgaris: Secondary | ICD-10-CM | POA: Diagnosis not present

## 2022-03-10 DIAGNOSIS — R030 Elevated blood-pressure reading, without diagnosis of hypertension: Secondary | ICD-10-CM

## 2022-03-10 DIAGNOSIS — R519 Headache, unspecified: Secondary | ICD-10-CM | POA: Diagnosis not present

## 2022-03-10 DIAGNOSIS — F17200 Nicotine dependence, unspecified, uncomplicated: Secondary | ICD-10-CM | POA: Diagnosis not present

## 2022-03-10 LAB — POCT URINALYSIS DIPSTICK, ED / UC
Bilirubin Urine: NEGATIVE
Glucose, UA: NEGATIVE mg/dL
Hgb urine dipstick: NEGATIVE
Ketones, ur: NEGATIVE mg/dL
Nitrite: NEGATIVE
Protein, ur: NEGATIVE mg/dL
Specific Gravity, Urine: 1.02 (ref 1.005–1.030)
Urobilinogen, UA: 2 mg/dL — ABNORMAL HIGH (ref 0.0–1.0)
pH: 7 (ref 5.0–8.0)

## 2022-03-10 LAB — POC URINE PREG, ED: Preg Test, Ur: NEGATIVE

## 2022-03-10 MED ORDER — KETOROLAC TROMETHAMINE 60 MG/2ML IM SOLN
60.0000 mg | Freq: Once | INTRAMUSCULAR | Status: AC
  Administered 2022-03-10: 60 mg via INTRAMUSCULAR

## 2022-03-10 MED ORDER — CYCLOBENZAPRINE HCL 5 MG PO TABS: 5.0000 mg | ORAL_TABLET | Freq: Three times a day (TID) | ORAL | 0 refills | Status: AC | PRN

## 2022-03-10 MED ORDER — KETOROLAC TROMETHAMINE 60 MG/2ML IM SOLN
INTRAMUSCULAR | Status: AC
  Filled 2022-03-10: qty 2

## 2022-03-10 NOTE — Discharge Instructions (Signed)
Recheck your blood pressure in 2 to 3 days looks elevated in the urgent care today.  Please keep your appointment with dermatology regarding your psoriasis.  Avoid heat and hot water as it makes rashes worse.

## 2022-03-10 NOTE — ED Provider Notes (Signed)
[facility]    CSN: [id number] Arrival date & time: 03/10/22  1641      History   Chief Complaint Chief Complaint  Patient presents with   Headache   Back Pain   Rash    HPI Gabrielle Wakeland is a 31 y.o. female.   31 year old female, Alaena Strader, presents to urgent care with chief complaint of headache since yesterday,  states she has taken Tylenol and ibuprofen(last took Tylenol earlier today), without relief . Also c/o low back pain for a week, no trauma or injury, has psoriasis flare.  Pt states her period is irregular, + nausea.  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 Heat makes rashes worse. Has appt with dermatology later this month. Is using cer've cream which is helping some.   The history is provided by the patient and a significant other. No language interpreter was used.    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   MDD (major depressive disorder), severe (HCC) 01/03/2019   Essential hypertension 11/28/2018   Anxiety and depression 11/28/2018   IUD contraception 08/09/2016   Acute systolic CHF (congestive heart failure) (HCC) 10/27/2014   Peripartum cardiomyopathy, 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 Authorizing Provider  cyclobenzaprine (FLEXERIL) 5 MG tablet Take 1 tablet (5 mg total) by mouth 3 (three) times daily as needed for up to 5 days for muscle spasms. 03/10/22 03/15/22 Yes Ladeja Pelham, Para March, NP  citalopram (CELEXA) 40 MG tablet Take 1 tablet (40 mg total) by mouth daily. 01/06/19   Money, Gerlene Burdock, FNP  clindamycin (CLEOCIN) 300 MG capsule Take 1 capsule (300 mg total) by mouth 2 (two) times daily. 05/05/20   Particia Nearing, PA-C  hydrOXYzine (ATARAX/VISTARIL) 25 MG tablet Take 1 tablet (25 mg total) by mouth 3 (three) times daily as needed. 01/05/19   Money, Gerlene Burdock, FNP  lidocaine (XYLOCAINE) 2 % solution Use as directed 15 mLs in the mouth or throat as needed for mouth pain. 05/04/20   Particia Nearing, PA-C  traZODone (DESYREL) 100 MG tablet Take 1 tablet (100 mg total) by  mouth at bedtime. 01/05/19 04/05/19  Money, Gerlene Burdock, FNP    Family History Family History  Problem Relation Age of Onset   Arthritis Mother    Depression Mother    Hyperlipidemia Mother    Learning disabilities Mother    Mental illness Mother    Varicose Veins Mother    Alcohol abuse Father    Arthritis Father    Asthma Father    Cancer Father    Drug abuse Father    Diabetes Father    Early death Father    Heart disease Father    Learning disabilities Sister    Hyperlipidemia Maternal Grandmother    Hyperlipidemia Maternal Grandfather     Social History Social History   Tobacco Use   Smoking status: Every Day    Packs/day: 1.00    Years: 10.00    Total pack years: 10.00    Types: Cigarettes   Smokeless tobacco: Never  Vaping Use   Vaping Use: Never used  Substance Use Topics   Alcohol use: Yes    Alcohol/week: 0.0 standard  drinks of alcohol    Comment: Rare   Drug use: Yes    Types: Heroin     Allergies   Lisinopril   Review of Systems Review of Systems  Constitutional:  Negative for fever.  HENT:  Negative for congestion.   Gastrointestinal:  Positive for nausea.  Genitourinary:  Negative for dysuria and vaginal discharge.  Musculoskeletal:  Positive for back pain and myalgias.  Skin:  Positive for color change and rash.  Neurological:  Positive for headaches.  All other systems reviewed and are negative.    Physical Exam Triage Vital Signs ED Triage Vitals  Enc Vitals Group     BP      Pulse      Resp      Temp      Temp src      SpO2      Weight      Height      Head Circumference      Peak Flow      Pain Score      Pain Loc      Pain Edu?      Excl. in GC?    No data found.  Updated Vital Signs BP (!) 155/105 (BP Location: Left Arm)   Pulse 90   Temp 98 F (36.7 C) (Oral)   Resp 18   SpO2 100%   Visual Acuity Right Eye Distance:   Left Eye Distance:   Bilateral Distance:    Right Eye Near:   Left Eye Near:    Bilateral Near:     Physical Exam Vitals and nursing note reviewed.  Constitutional:      General: She is not in acute distress.    Appearance: She is well-developed and well-groomed.  HENT:     Head: Normocephalic and atraumatic.  Eyes:     General: Lids are normal. Vision grossly intact.     Extraocular Movements: Extraocular movements intact.     Conjunctiva/sclera: Conjunctivae normal.     Pupils: Pupils are equal, round, and reactive to light.  Cardiovascular:     Rate and Rhythm: Normal rate and regular rhythm.     Pulses: Normal pulses.     Heart sounds: Normal heart sounds. No murmur heard. Pulmonary:     Effort: Pulmonary effort is normal. No respiratory distress.     Breath sounds: Normal breath sounds.  Abdominal:  Palpations: Abdomen is soft.     Tenderness: There is no abdominal tenderness.  Musculoskeletal:        General: No  swelling.     Cervical back: Neck supple.     Lumbar back: Spasms and tenderness present. No swelling, edema, deformity, signs of trauma, lacerations or bony tenderness. Normal range of motion. Negative right straight leg raise test and negative left straight leg raise test. No scoliosis.  Skin:    General: Skin is warm and dry.     Capillary Refill: Capillary refill takes less than 2 seconds.     Findings: Rash present.     Comments: Flaky erythematous plaques, especially at flexor areas.  Neurological:     General: No focal deficit present.     Mental Status: She is alert and oriented to person, place, and time.     GCS: GCS eye subscore is 4. GCS verbal subscore is 5. GCS motor subscore is 6.  Psychiatric:        Attention and Perception: Attention normal.        Mood and Affect: Mood normal.        Speech: Speech normal.        Behavior: Behavior normal. Behavior is cooperative.        Thought Content: Thought content normal.      UC Treatments / Results  Labs (all labs ordered are listed, but only abnormal results are displayed) Labs Reviewed  POCT URINALYSIS DIPSTICK, ED / UC - Abnormal; Notable for the following components:      Result Value   Urobilinogen, UA 2.0 (*)    Leukocytes,Ua SMALL (*)    All other components within normal limits  POC URINE PREG, ED    EKG   Radiology No results found.  Procedures Procedures (including critical care time)  Medications Ordered in UC Medications  ketorolac (TORADOL) injection 60 mg (60 mg Intramuscular Given 03/10/22 1819)    Initial Impression / Assessment and Plan / UC Course  I have reviewed the triage vital signs and the nursing notes.  Pertinent labs & imaging results that were available during my care of the patient were reviewed by me and considered in my medical decision making (see chart for details).  Clinical Course as of 03/10/22 1844  Thu Mar 10, 2022  1801 Negative preg, Ua neg w exception of small  leuk,neg nitrate [JD]    Clinical Course User Index [JD] Lamontae Ricardo, Para March, NP    Ddx: HA, muscle spasms, Psoriasis, viral illness, pregnancy Final Clinical Impressions(s) / UC Diagnoses   Final diagnoses:  Acute nonintractable headache, unspecified headache type  Smoker  Elevated blood pressure reading  Plaque psoriasis     Discharge Instructions      Recheck your blood pressure in 2 to 3 days looks elevated in the urgent care today.  Please keep your appointment with dermatology regarding your psoriasis.  Avoid heat and hot water as it makes rashes worse.     ED Prescriptions     Medication Sig Dispense Auth. Provider   cyclobenzaprine (FLEXERIL) 5 MG tablet Take 1 tablet (5 mg total) by mouth 3 (three) times daily as needed for up to 5 days for muscle spasms. 15 tablet Demira Gwynne, Para March, NP      PDMP not reviewed this encounter.   Clancy Gourd, NP 03/10/22 1844

## 2022-03-10 NOTE — ED Triage Notes (Signed)
Pt presents to UC with multiple complaints.   Pt endorses a headache since yesterday. States have tried Ibuprofen and Tylenol with no relief.  Also reports lower back pain x 1 week. Denies any urinary symptoms.  Reports irregular menstrual the past two months. States have noticed more bleeding than normal.  Also reports a possible psoriasis flare up. States the rashes are spreading up her legs and bilateral arms and back.

## 2022-04-27 ENCOUNTER — Ambulatory Visit (INDEPENDENT_AMBULATORY_CARE_PROVIDER_SITE_OTHER): Payer: Commercial Managed Care - HMO | Admitting: Primary Care

## 2022-04-27 ENCOUNTER — Other Ambulatory Visit: Payer: Self-pay

## 2022-04-27 ENCOUNTER — Encounter (INDEPENDENT_AMBULATORY_CARE_PROVIDER_SITE_OTHER): Payer: Self-pay | Admitting: Primary Care

## 2022-04-27 ENCOUNTER — Other Ambulatory Visit (HOSPITAL_COMMUNITY)
Admission: RE | Admit: 2022-04-27 | Discharge: 2022-04-27 | Disposition: A | Discharge status: Home / Self Care | Payer: Commercial Managed Care - HMO | Source: Ambulatory Visit | Attending: Primary Care | Admitting: Primary Care

## 2022-04-27 VITALS — BP 163/90 | HR 63 | Resp 16 | Ht 62.0 in | Wt 164.6 lb

## 2022-04-27 DIAGNOSIS — Z124 Encounter for screening for malignant neoplasm of cervix: Secondary | ICD-10-CM

## 2022-04-27 DIAGNOSIS — Z7689 Persons encountering health services in other specified circumstances: Secondary | ICD-10-CM | POA: Insufficient documentation

## 2022-04-27 DIAGNOSIS — F322 Major depressive disorder, single episode, severe without psychotic features: Secondary | ICD-10-CM

## 2022-04-27 DIAGNOSIS — Z13220 Encounter for screening for lipoid disorders: Secondary | ICD-10-CM | POA: Diagnosis not present

## 2022-04-27 DIAGNOSIS — N898 Other specified noninflammatory disorders of vagina: Secondary | ICD-10-CM

## 2022-04-27 DIAGNOSIS — F1990 Other psychoactive substance use, unspecified, uncomplicated: Secondary | ICD-10-CM

## 2022-04-27 DIAGNOSIS — I509 Heart failure, unspecified: Secondary | ICD-10-CM

## 2022-04-27 DIAGNOSIS — Z131 Encounter for screening for diabetes mellitus: Secondary | ICD-10-CM

## 2022-04-27 MED ORDER — CITALOPRAM HYDROBROMIDE 20 MG PO TABS
20.0000 mg | ORAL_TABLET | Freq: Every day | ORAL | 3 refills | Status: DC
  Filled 2022-04-27: qty 30, 30d supply, fill #0

## 2022-04-27 MED ORDER — ESCITALOPRAM OXALATE 20 MG PO TABS
20.0000 mg | ORAL_TABLET | Freq: Every day | ORAL | 1 refills | Status: AC
  Filled 2022-04-27: qty 30, 30d supply, fill #0

## 2022-04-27 NOTE — Progress Notes (Signed)
[facility]   Subjective:  [name] is a 31 y.o. female presents for establish care. She complains of headache she uses Tylenol or ibuprofen  without relief . She has chronic back pain . 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 Suppose to start a methadone clinic  next  week. She has c/o vaginal discharge , no trauma or injury, has psoriasis flare. Pt states her period is irregular, + nausea. Patient has  No chest pain, No abdominal pain - No Nausea, No new weakness tingling or numbness, No Cough - shortness of breath    Past Medical History:  Diagnosis Date   Bacterial vaginosis    CHF (congestive heart failure) (HCC)    Depression    Gonorrhea    Hypertension    Psoriasis    UTI (urinary tract infection)    Vaginal Pap smear, abnormal      Allergies  Allergen Reactions   Lisinopril Cough      Current Outpatient Medications on File Prior to Visit  Medication Sig Dispense Refill   citalopram (CELEXA) 40 MG tablet Take 1 tablet (40 mg total) by mouth daily. (Patient not taking: Reported on 04/27/2022) 30 tablet 0   clindamycin (CLEOCIN) 300 MG capsule Take 1 capsule (300 mg total) by mouth 2 (two) times daily. (Patient not taking: Reported on 04/27/2022) 14 capsule 0   hydrOXYzine (ATARAX/VISTARIL) 25 MG tablet Take 1 tablet (25 mg total) by mouth 3 (three) times daily as needed. (Patient not taking: Reported on 04/27/2022) 30 tablet 0   lidocaine (XYLOCAINE) 2 % solution Use as directed 15 mLs in the mouth or throat as needed for mouth pain. (Patient not taking: Reported on 04/27/2022) 100 mL 0   traZODone (DESYREL) 100 MG tablet Take 1 tablet (100 mg total) by mouth at bedtime. 30 tablet 0   No current facility-administered medications on file prior to visit.     Review of System: Comprehensive ROS Pertinent positive and negative noted in HPI    Objective:  BP (!) 163/90   Pulse 63   Resp 16   Ht 5\' 2"  (1.575 m)   Wt 164 lb 9.6 oz (74.7 kg)    LMP 04/13/2022   SpO2 97%   BMI 30.11 kg/m        Physical Examination:  General appearance - well appearing, and in no distress Mental status - alert, oriented to person, place, and time Psych:  She has a normal mood and affect Skin - psoriasis all over body worst on lower extremities  Chest - effort normal, all lung fields clear to auscultation bilaterally Heart - normal rate and regular rhythm Neck:  midline trachea, no thyromegaly or nodules Breasts - breasts appear normal, no suspicious masses, no skin or nipple changes or axillary nodes Educated patient on proper self breast examination and had patient to demonstrate SBE. Abdomen - soft, nontender, nondistended, no masses or organomegaly Pelvic-VULVA: normal appearing vulva with no masses, tenderness or lesions   VAGINA: normal appearing vagina with normal color and discharge, no lesions   CERVIX: normal appearing cervix without discharge or lesions, no CMT UTERUS: uterus is felt to be normal size, shape, consistency and nontender  ADNEXA: No adnexal masses or tenderness noted. Extremities:  No swelling or varicosities noted   Assessment:  Lauren Sharp was seen today for new patient (initial visit), psoriasis, vaginal discharge, menstrual problem and back pain.  Diagnoses and all orders for this visit:  Encounter to establish  care -     Cervicovaginal ancillary only -     HCV Ab w Reflex to Quant PCR  Vaginal discharge -     Cervicovaginal ancillary only -     Cytology - PAP  Cervical cancer screening -     Cervicovaginal ancillary only -     Cytology - PAP  Intravenous drug user -     HIV Antibody (routine testing w rflx) -     CBC -     Comprehensive metabolic panel -     Lipid panel -     Vitamin D, 25-hydroxy -     Vitamin B12 -     Ambulatory referral to Psychiatry  Diabetes mellitus screening -     Hemoglobin A1c  Lipid screening  Heart failure, unspecified HF chronicity, unspecified heart failure type  (HCC) -     Ambulatory referral to Cardiology  Severe depression (HCC) -     Discontinue: citalopram (CELEXA) 20 MG tablet; Take 1 tablet (20 mg total) by mouth daily. -     Ambulatory referral to Psychiatry -     escitalopram (LEXAPRO) 20 MG tablet; Take 1 tablet (20 mg total) by mouth daily.     This note has been created with Education officer, environmental. Any transcriptional errors are unintentional.   Grayce Sessions, NP 04/27/2022, 2:35 PM

## 2022-04-28 ENCOUNTER — Other Ambulatory Visit: Payer: Self-pay

## 2022-04-29 LAB — HIV ANTIBODY (ROUTINE TESTING W REFLEX): HIV Screen 4th Generation wRfx: NONREACTIVE

## 2022-04-29 LAB — COMPREHENSIVE METABOLIC PANEL
ALT: 13 IU/L (ref 0–32)
AST: 15 IU/L (ref 0–40)
Albumin/Globulin Ratio: 1.8 (ref 1.2–2.2)
Albumin: 4.4 g/dL (ref 3.9–4.9)
Alkaline Phosphatase: 85 IU/L (ref 44–121)
BUN/Creatinine Ratio: 15 (ref 9–23)
BUN: 9 mg/dL (ref 6–20)
Bilirubin Total: 0.3 mg/dL (ref 0.0–1.2)
CO2: 25 mmol/L (ref 20–29)
Calcium: 10.8 mg/dL — ABNORMAL HIGH (ref 8.7–10.2)
Chloride: 100 mmol/L (ref 96–106)
Creatinine, Ser: 0.62 mg/dL (ref 0.57–1.00)
Globulin, Total: 2.5 g/dL (ref 1.5–4.5)
Glucose: 109 mg/dL — ABNORMAL HIGH (ref 70–99)
Potassium: 5 mmol/L (ref 3.5–5.2)
Sodium: 136 mmol/L (ref 134–144)
Total Protein: 6.9 g/dL (ref 6.0–8.5)
eGFR: 122 mL/min/{1.73_m2} (ref >59)

## 2022-04-29 LAB — CERVICOVAGINAL ANCILLARY ONLY
Bacterial Vaginitis (gardnerella): POSITIVE — AB
Candida Glabrata: NEGATIVE
Candida Vaginitis: NEGATIVE
Chlamydia: NEGATIVE
Comment: NEGATIVE
Comment: NEGATIVE
Comment: NEGATIVE
Comment: NEGATIVE
Comment: NEGATIVE
Comment: NORMAL
Neisseria Gonorrhea: NEGATIVE
Trichomonas: NEGATIVE

## 2022-04-29 LAB — LIPID PANEL
Chol/HDL Ratio: 4.1 ratio (ref 0.0–4.4)
Cholesterol, Total: 189 mg/dL (ref 100–199)
HDL: 46 mg/dL (ref >39)
LDL Chol Calc (NIH): 131 mg/dL — ABNORMAL HIGH (ref 0–99)
Triglycerides: 65 mg/dL (ref 0–149)
VLDL Cholesterol Cal: 12 mg/dL (ref 5–40)

## 2022-04-29 LAB — CBC
Hematocrit: 39.2 % (ref 34.0–46.6)
Hemoglobin: 13.3 g/dL (ref 11.1–15.9)
MCH: 29.2 pg (ref 26.6–33.0)
MCHC: 33.9 g/dL (ref 31.5–35.7)
MCV: 86 fL (ref 79–97)
Platelets: 254 10*3/uL (ref 150–450)
RBC: 4.55 x10E6/uL (ref 3.77–5.28)
RDW: 13.9 % (ref 11.7–15.4)
WBC: 8.5 10*3/uL (ref 3.4–10.8)

## 2022-04-29 LAB — HEMOGLOBIN A1C
Est. average glucose Bld gHb Est-mCnc: 100 mg/dL
Hgb A1c MFr Bld: 5.1 % (ref 4.8–5.6)

## 2022-04-29 LAB — HCV AB W REFLEX TO QUANT PCR: HCV Ab: NONREACTIVE

## 2022-04-29 LAB — VITAMIN D 25 HYDROXY (VIT D DEFICIENCY, FRACTURES): Vit D, 25-Hydroxy: 18.2 ng/mL — ABNORMAL LOW (ref 30.0–100.0)

## 2022-04-29 LAB — VITAMIN B12: Vitamin B-12: 363 pg/mL (ref 232–1245)

## 2022-04-29 LAB — HCV INTERPRETATION

## 2022-05-02 ENCOUNTER — Other Ambulatory Visit (INDEPENDENT_AMBULATORY_CARE_PROVIDER_SITE_OTHER): Payer: Self-pay | Admitting: Primary Care

## 2022-05-02 LAB — CYTOLOGY - PAP
Comment: NEGATIVE
Diagnosis: NEGATIVE
High risk HPV: NEGATIVE

## 2022-05-02 MED ORDER — ERGOCALCIFEROL 1.25 MG (50000 UT) PO CAPS
50000.0000 [IU] | ORAL_CAPSULE | ORAL | 0 refills | Status: AC
  Filled 2022-05-02: qty 4, 28d supply, fill #0

## 2022-05-02 MED ORDER — METRONIDAZOLE 500 MG PO TABS
500.0000 mg | ORAL_TABLET | Freq: Two times a day (BID) | ORAL | 0 refills | Status: DC
  Filled 2022-05-02: qty 14, 7d supply, fill #0

## 2022-05-03 ENCOUNTER — Other Ambulatory Visit: Payer: Self-pay

## 2022-05-03 ENCOUNTER — Telehealth (INDEPENDENT_AMBULATORY_CARE_PROVIDER_SITE_OTHER): Payer: Self-pay

## 2022-05-03 NOTE — Telephone Encounter (Signed)
Pt called for lab results.  Shared provider's note.  Kerin Perna, NP  05/02/2022 11:39 PM EDT Back to Top    Your cholesterol higher than expected. High cholesterol may increase risk of heart attack and/or stroke. Consider eating more fruits, vegetables, and lean baked meats such as chicken or fish. Moderate intensity exercise at least 150 minutes as tolerated per week may help as well.   Your vitamin D is low. Your Vitamin D is low. Vitamin D is needed to make and keep bones strong.  A prescription of vitamin D 2 50,000 iu weekly for 12 weeks.  Then you can purchase  vitamin D2 2000 iu over the counter  and take 1 tablet daily.  Will repeat your levels in 13months.   Bacterial vaginosis .A prescription for metronidazole. You take this medication twice a day for 7 days. Be sure that you do not drink alcohol when you take this medication because the combination can give you severe nausea and vomiting.  It can sometimes give people a metallic taste in their mouth while they are taking it. When you take antibiotics, it can wipe out your gut flora.  This can cause problems like diarrhea.  Your test was normal HIV , your B12, hepatitis and you are not a diabetic    Pt would like to know if her partner needs testing or treatment for BV. Pt states that every time she is tested she has BV and is wondering why.  Please return pt's call.

## 2022-05-03 NOTE — Telephone Encounter (Signed)
Will forward to provider  

## 2022-05-03 NOTE — Telephone Encounter (Signed)
Tried contacting pt 3 times at listed number and kept getting this call can't be completed at this time

## 2022-05-04 ENCOUNTER — Encounter (INDEPENDENT_AMBULATORY_CARE_PROVIDER_SITE_OTHER): Payer: Self-pay

## 2022-05-09 ENCOUNTER — Telehealth (HOSPITAL_COMMUNITY): Payer: Self-pay | Admitting: Licensed Clinical Social Worker

## 2022-05-09 NOTE — Telephone Encounter (Signed)
05/09/2022 Name: Lauren Sharp MRN: [id number] DOB: [birth year]  Unsuccessful outbound call made today to assist with:   [facility] CD-IOP  Outreach Attempt:  1st Attempt  Spoke with Royetta Asal regarding group treatment. She reports that she is not available to attend for CD-IOP group treatment as she is already attending treatment with another provider at the moment.  Darol Destine, [facility]

## 2022-06-02 ENCOUNTER — Ambulatory Visit (INDEPENDENT_AMBULATORY_CARE_PROVIDER_SITE_OTHER): Payer: Self-pay | Admitting: *Deleted

## 2022-06-02 NOTE — Telephone Encounter (Signed)
Will forward to provider  

## 2022-06-02 NOTE — Telephone Encounter (Addendum)
Summary: possible BV   Pt thinks she has BV again,  She is having the same systems as she had a month ago,  I tried to get her an appt but nothing until Monday so she ask that a nurse call her back.              Chief Complaint: foul smelling vaginal discharge Symptoms: still on period at end of period. Noted clear thick discharge foul smelling odor. No abdominal pain. Reports last seen in office for same issue and treated with antibiotics. Requesting more antibiotics until future visit scheduled for 06/08/22 Frequency: now  Pertinent Negatives: Patient denies fever, no pain, no rash no itching  Disposition: [] ED /[x] Urgent Care (no appt availability in office) / [] Appointment(In office/virtual)/ []  [facility]/ [] Home Care/ [] Refused Recommended Disposition /[] [facility]/ []  Follow-up with PCP Additional Notes:   No available appt with in 3 days. Patient reports she was recently seen in office for same issue  PCP responded 05/03/22. Requesting additional antibiotics. Please advise . Recommended UC and patient would like a call back from PCP 1st.     Reason for Disposition  Bad smelling vaginal discharge  Answer Assessment - Initial Assessment Questions 1. DISCHARGE: "Describe the discharge." (e.g., white, yellow, green, gray, foamy, cottage cheese-like)     Clear , thick discharge 2. ODOR: "Is there a bad odor?"     Bad odor  3. ONSET: "When did the discharge begin?"     At end of period and smell is foul 4. RASH: "Is there a rash in the genital area?" If Yes, ask: "Describe it." (e.g., redness, blisters, sores, bumps)     No  5. ABDOMEN PAIN: "Are you having any abdomen pain?" If Yes, ask: "What does it feel like? " (e.g., crampy, dull, intermittent, constant)      Cramping on period  6. ABDOMEN PAIN SEVERITY: If present, ask: "How bad is it?" (e.g., Scale 1-10; mild, moderate, or severe)   - MILD (1-3): Doesn't interfere with normal activities, abdomen  soft and not tender to touch.    - MODERATE (4-7): Interferes with normal activities or awakens from sleep, abdomen tender to touch.    - SEVERE (8-10): Excruciating pain, doubled over, unable to do any normal activities. (R/O peritonitis)      na 7. CAUSE: "What do you think is causing the discharge?" "Have you had the same problem before? What happened then?"     *No Answer* 8. OTHER SYMPTOMS: "Do you have any other symptoms?" (e.g., fever, itching, vaginal bleeding, pain with urination, injury to genital area, vaginal foreign body)     Vaginal discharge, clear thick white 9. PREGNANCY: "Is there any chance you are pregnant?" "When was your last menstrual period?"     On period now  Protocols used: Vaginal Discharge-A-AH

## 2022-06-03 NOTE — Telephone Encounter (Signed)
Contacted pt and pt has been scheduled for a nurse visit for Monday 11/6 for vaginal swab

## 2022-06-06 ENCOUNTER — Ambulatory Visit (INDEPENDENT_AMBULATORY_CARE_PROVIDER_SITE_OTHER): Payer: Commercial Managed Care - HMO

## 2022-06-08 ENCOUNTER — Ambulatory Visit (INDEPENDENT_AMBULATORY_CARE_PROVIDER_SITE_OTHER): Payer: Commercial Managed Care - HMO | Admitting: Primary Care

## 2022-06-13 ENCOUNTER — Encounter (HOSPITAL_COMMUNITY): Payer: Self-pay

## 2022-06-13 ENCOUNTER — Other Ambulatory Visit: Payer: Self-pay

## 2022-06-13 ENCOUNTER — Ambulatory Visit (HOSPITAL_COMMUNITY)
Admission: EM | Admit: 2022-06-13 | Discharge: 2022-06-13 | Disposition: A | Discharge status: Home / Self Care | Payer: Commercial Managed Care - HMO | Attending: Emergency Medicine | Admitting: Emergency Medicine

## 2022-06-13 DIAGNOSIS — R110 Nausea: Secondary | ICD-10-CM | POA: Diagnosis not present

## 2022-06-13 DIAGNOSIS — L0201 Cutaneous abscess of face: Secondary | ICD-10-CM

## 2022-06-13 MED ORDER — ONDANSETRON 4 MG PO TBDP
ORAL_TABLET | ORAL | Status: AC
  Filled 2022-06-13: qty 1

## 2022-06-13 MED ORDER — AMOXICILLIN-POT CLAVULANATE 875-125 MG PO TABS
1.0000 | ORAL_TABLET | Freq: Two times a day (BID) | ORAL | 0 refills | Status: AC
  Filled 2022-06-13: qty 10, 5d supply, fill #0

## 2022-06-13 MED ORDER — ONDANSETRON 4 MG PO TBDP
4.0000 mg | ORAL_TABLET | Freq: Once | ORAL | Status: AC
  Administered 2022-06-13: 4 mg via ORAL

## 2022-06-13 MED ORDER — ONDANSETRON HCL 4 MG PO TABS
4.0000 mg | ORAL_TABLET | Freq: Four times a day (QID) | ORAL | 0 refills | Status: DC
  Filled 2022-06-13: qty 12, 3d supply, fill #0

## 2022-06-13 NOTE — ED Provider Notes (Signed)
[facility]    CSN: [id number] Arrival date & time: 06/13/22  0932     History   Chief Complaint Chief Complaint  Patient presents with   Dental Pain    HPI Lauren Sharp is a 31 y.o. female.  Presents with 4-day history of facial swelling Right-sided, upper cheek, 10/10 pain Tender to touch and open mouth Tolerate secretions and denies shortness of breath No dental pain with chewing   At first ibuprofen and Tylenol were working  Denies history of abscess No fever chills  Reports pain causes nausea No vomiting or abdominal pain LMP 10/29, denies possibility of pregnancy   Last ibuprofen 5 hours ago  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   MDD (major depressive disorder), severe (HCC) 01/03/2019   Essential hypertension 11/28/2018   Anxiety and depression 11/28/2018   IUD contraception 08/09/2016   Acute systolic CHF (congestive heart failure) (HCC) 10/27/2014   Peripartum cardiomyopathy, 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 Authorizing Provider  amoxicillin-clavulanate (AUGMENTIN) 875-125 MG tablet Take 1 tablet by mouth 2 (two) times daily for 5 days. 06/13/22 06/18/22 Yes Zaiden Ludlum, PA-C  ondansetron (ZOFRAN) 4 MG tablet Take 1 tablet (4 mg total) by mouth every 6 (six) hours. 06/13/22  Yes Aemilia Dedrick, Lurena Joiner, PA-C  ergocalciferol (VITAMIN D2) 1.25 MG (50000 UT) capsule Take 1 capsule (50,000 Units total) by mouth once a week. 05/02/22   Grayce Sessions, NP  escitalopram (LEXAPRO) 20 MG tablet Take 1 tablet (20 mg total) by mouth daily. 04/27/22   Grayce Sessions, NP    Family History Family History  Problem Relation Age of Onset   Arthritis Mother    Depression Mother    Hyperlipidemia Mother    Learning disabilities Mother    Mental illness Mother    Varicose Veins Mother    Alcohol abuse Father    Arthritis Father    Asthma Father    Cancer Father    Drug abuse Father    Diabetes Father    Early death Father    Heart disease Father    Learning disabilities Sister    Hyperlipidemia  Maternal Grandmother    Hyperlipidemia Maternal Grandfather     Social History Social History   Tobacco Use   Smoking status: Every Day    Packs/day: 1.00    Years: 10.00    Total pack years: 10.00    Types: Cigarettes   Smokeless tobacco: Never  Vaping Use   Vaping Use: Never used  Substance Use Topics   Alcohol use: Yes    Alcohol/week: 0.0 standard drinks of alcohol    Comment: Rare   Drug use: Yes    Types: Heroin     Allergies   Lisinopril   Review of Systems Review of Systems  Per HPI  Physical Exam Triage Vital Signs ED Triage Vitals  Enc Vitals Group     BP 06/13/22 1116 134/81     Pulse Rate 06/13/22 1116 72     Resp 06/13/22 1116 16     Temp 06/13/22 1116 98.2 F (36.8 C)     Temp Source 06/13/22 1116 Oral     SpO2 06/13/22 1116 98 %     Weight --      Height --      Head Circumference --      Peak Flow --      Pain Score 06/13/22  1117 10     Pain Loc --      Pain Edu? --      Excl. in GC? --    No data found.  Updated Vital Signs BP 134/81 (BP Location: Left Arm)   Pulse 72   Temp 98.2 F (36.8 C) (Oral)   Resp 16   LMP  (LMP Unknown)   SpO2 98%    Physical Exam Vitals and nursing note reviewed.  Constitutional:      General: She is not in acute distress.    Appearance: Normal appearance.  HENT:     Head:     Jaw: There is normal jaw occlusion.      Comments: Tender in area noted. Swelling of the right face noted.     Mouth/Throat:     Mouth: Mucous membranes are moist. No angioedema.     Dentition: Abnormal dentition. Dental caries present. No dental tenderness, gingival swelling or dental abscesses.     Pharynx: Oropharynx is clear. Uvula midline. No posterior oropharyngeal erythema.     Tonsils: No tonsillar abscesses.     Comments: Teeth and gums nontender. Pain is more in the face/cheek Cardiovascular:     Rate and Rhythm: Normal rate and regular rhythm.     Pulses: Normal pulses.     Heart sounds: Normal heart sounds.  Pulmonary:     Effort: Pulmonary effort is normal.     Breath sounds: Normal breath sounds.  Abdominal:     Tenderness: There is no abdominal tenderness. There is no guarding.  Musculoskeletal:     Cervical back: Normal range of motion. No rigidity.  Lymphadenopathy:     Cervical: No cervical adenopathy.  Skin:    General: Skin is warm and dry.  Neurological:     Mental Status: She is alert and oriented to person, place, and time.     UC Treatments / Results  Labs (all labs ordered are listed, but only abnormal results are displayed) Labs Reviewed - No data to display  EKG  Radiology No results found.  Procedures Procedures   Medications Ordered in UC Medications  ondansetron (ZOFRAN-ODT) disintegrating tablet 4 mg (has no administration in time range)  Initial Impression / Assessment and Plan / UC Course  I have reviewed the triage vital signs and  the nursing notes.  Pertinent labs & imaging results that were available during my care of the patient were reviewed by me and considered in my medical decision making (see chart for details).  Facial abscess  Nausea  Augmentin BID x 5 days Zofran in clinic for nausea, sent to pharmacy to use q6 hours prn Discussed taking before abx, eating before medication Lots of fluids Strict ED precautions discussed, can return here if symptoms don't improve with abx Patient agrees to plan  Final Clinical Impressions(s) / UC Diagnoses   Final diagnoses:  Facial abscess  Nausea without vomiting     Discharge Instructions      Please take medication as prescribed. Take with food to avoid upset stomach.  You can take the zofran 30 min before eating to settle stomach Drink lots of fluids  ED for worsening symptoms as discussed.     ED Prescriptions     Medication Sig Dispense Auth. Provider   amoxicillin-clavulanate (AUGMENTIN) 875-125 MG tablet Take 1 tablet by mouth 2 (two) times daily for 5 days. 10 tablet Ruhama Lehew, PA-C   ondansetron (ZOFRAN) 4 MG tablet Take 1 tablet (4 mg total) by mouth every 6 (six) hours. 12 tablet Johnette Teigen, Lurena Joiner, PA-C      PDMP not reviewed this encounter.   Lielle Vandervort, Lurena Joiner, PA-C 06/13/22 1210

## 2022-06-13 NOTE — ED Triage Notes (Signed)
Pt is here for dental pain causing facial swelling x4days

## 2022-06-13 NOTE — Discharge Instructions (Addendum)
Please take medication as prescribed. Take with food to avoid upset stomach.  You can take the zofran 30 min before eating to settle stomach Drink lots of fluids  ED for worsening symptoms as discussed.

## 2022-06-16 ENCOUNTER — Other Ambulatory Visit: Payer: Self-pay

## 2022-06-16 MED ORDER — ESCITALOPRAM OXALATE 10 MG PO TABS
10.0000 mg | ORAL_TABLET | Freq: Every morning | ORAL | 1 refills | Status: AC
  Filled 2022-06-16 – 2022-07-01 (×2): qty 30, 30d supply, fill #0

## 2022-06-22 ENCOUNTER — Other Ambulatory Visit: Payer: Self-pay

## 2022-07-01 ENCOUNTER — Other Ambulatory Visit: Payer: Self-pay

## 2022-07-08 ENCOUNTER — Other Ambulatory Visit: Payer: Self-pay

## 2022-07-08 NOTE — Progress Notes (Deleted)
CARDIOLOGY CONSULT NOTE       Patient ID: Lauren Sharp MRN: [id number] DOB/AGE: 30-Dec-1990 31 y.o.  Admit date: (Not on file) Referring Physician: Randa Evens Primary Physician: Grayce Sessions, NP Primary Cardiologist: New previously [facility] Reason for Consultation: CHF  Active Problems:   * No active hospital problems. *   HPI:  31 y.o. referred by NP Gwinda Passe for CHF. History of HTN , heroin use chronic back pain and depression. 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  March 2015 EF 15% with moderate RV dysfunction f/U TTE 01/13/15 near normalization with EF 50-55% trivial MR and normal RV  In 2016 had pre eclampsia and severe HTN with respiratory failure post C section Was Rx with coreg, aldactone and losartan had cough with ACE   ***  ROS All other systems reviewed and negative except as noted above  Past Medical History:  Diagnosis Date   Bacterial vaginosis    CHF (congestive heart failure) (HCC)    Depression    Gonorrhea    Hypertension    Psoriasis    UTI (urinary tract infection)    Vaginal Pap smear, abnormal     Family History  Problem Relation Age of Onset   Arthritis Mother    Depression Mother    Hyperlipidemia Mother    Learning disabilities Mother    Mental illness Mother    Varicose Veins Mother    Alcohol abuse Father    Arthritis Father    Asthma Father    Cancer Father    Drug abuse Father    Diabetes Father    Early death Father    Heart disease Father    Learning disabilities Sister    Hyperlipidemia Maternal Grandmother    Hyperlipidemia Maternal Grandfather     Social History   Socioeconomic History   Marital status: Single    Spouse name: Not on file   Number of children: Not on file   Years of education: Not on file   Highest education level: Not on file  Occupational History   Not on file  Tobacco Use   Smoking status: Every Day    Packs/day: 1.00    Years: 10.00    Total pack years: 10.00     Types: Cigarettes   Smokeless tobacco: Never  Vaping Use   Vaping Use: Never used  Substance and Sexual Activity   Alcohol use: Yes    Alcohol/week: 0.0 standard drinks of alcohol    Comment: Rare   Drug use: Yes    Types: Heroin   Sexual activity: Yes    Birth control/protection: Injection  Other Topics Concern   Not on file  Social History Narrative   Not on file   Social Determinants of Health   Financial Resource Strain: Not on file  Food Insecurity: Not on file  Transportation Needs: Not on file  Physical Activity: Not on file  Stress: Not on file  Social Connections: Not on file  Intimate Partner Violence: Not on file    Past Surgical History:  Procedure Laterality Date   CESAREAN SECTION N/A 10/21/2014   Procedure: CESAREAN SECTION;  Surgeon: Tracey Harries, MD;  Location: [facility];  Service: Obstetrics;  Laterality: N/A;   NO PAST SURGERIES     TOOTH EXTRACTION        Current Outpatient Medications:    ergocalciferol (VITAMIN D2) 1.25 MG (50000 UT) capsule, Take 1 capsule (50,000 Units total) by mouth once a week., Disp: 12  capsule, Rfl: 0   escitalopram (LEXAPRO) 10 MG tablet, Take 1 tablet (10 mg total) by mouth every morning., Disp: 30 tablet, Rfl: 1   escitalopram (LEXAPRO) 20 MG tablet, Take 1 tablet (20 mg total) by mouth daily., Disp: 60 tablet, Rfl: 1   ondansetron (ZOFRAN) 4 MG tablet, Take 1 tablet (4 mg total) by mouth every 6 (six) hours., Disp: 12 tablet, Rfl: 0    Physical Exam: There were no vitals taken for this visit.   Affect appropriate Healthy:  appears stated age HEENT: normal Neck supple with no adenopathy JVP normal no bruits no thyromegaly Lungs clear with no wheezing and good diaphragmatic motion Heart:  S1/S2 no murmur, no rub, gallop or click PMI normal Abdomen: benighn, BS positve, no tenderness, no AAA no bruit.  No HSM or HJR Distal pulses intact with no bruits No edema Neuro non-focal Skin warm and dry No muscular  weakness   Labs:   Lab Results  Component Value Date   WBC 8.5 04/27/2022   HGB 13.3 04/27/2022   HCT 39.2 04/27/2022   MCV 86 04/27/2022   PLT 254 04/27/2022   No results for input(s): "NA", "K", "CL", "CO2", "BUN", "CREATININE", "CALCIUM", "PROT", "BILITOT", "ALKPHOS", "ALT", "AST", "GLUCOSE" in the last 168 hours.  Invalid input(s): "LABALBU" No results found for: "CKTOTAL", "CKMB", "CKMBINDEX", "TROPONINI"  Lab Results  Component Value Date   CHOL 189 04/27/2022   CHOL 157 12/19/2018   Lab Results  Component Value Date   HDL 46 04/27/2022   HDL 36 (L) 12/19/2018   Lab Results  Component Value Date   LDLCALC 131 (H) 04/27/2022   LDLCALC 106 (H) 12/19/2018   Lab Results  Component Value Date   TRIG 65 04/27/2022   TRIG 77 12/19/2018   Lab Results  Component Value Date   CHOLHDL 4.1 04/27/2022   CHOLHDL 4.4 12/19/2018   No results found for: "LDLDIRECT"    Radiology: No results found.  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 At that time Rx with coreg, losartan and aldactone. Echo 01/13/15 with near full recovery EF 50-55% Will update echo  Depression: continue Lexapro Drug Use:  iv heroin HIV negative counseled f/u primary   TTE  F/U in a year   Signed: Charlton Haws 07/08/2022, 11:15 AM

## 2022-07-14 ENCOUNTER — Encounter (HOSPITAL_COMMUNITY): Payer: Self-pay

## 2022-07-14 ENCOUNTER — Ambulatory Visit (HOSPITAL_COMMUNITY)
Admission: EM | Admit: 2022-07-14 | Discharge: 2022-07-14 | Disposition: A | Discharge status: Home / Self Care | Payer: Commercial Managed Care - HMO | Attending: Family Medicine | Admitting: Family Medicine

## 2022-07-14 ENCOUNTER — Other Ambulatory Visit: Payer: Self-pay

## 2022-07-14 DIAGNOSIS — N760 Acute vaginitis: Secondary | ICD-10-CM | POA: Insufficient documentation

## 2022-07-14 DIAGNOSIS — R102 Pelvic and perineal pain: Secondary | ICD-10-CM | POA: Insufficient documentation

## 2022-07-14 LAB — POCT URINALYSIS DIPSTICK, ED / UC
Bilirubin Urine: NEGATIVE
Glucose, UA: NEGATIVE mg/dL
Ketones, ur: NEGATIVE mg/dL
Nitrite: NEGATIVE
Protein, ur: NEGATIVE mg/dL
Specific Gravity, Urine: 1.02 (ref 1.005–1.030)
Urobilinogen, UA: 0.2 mg/dL (ref 0.0–1.0)
pH: 5.5 (ref 5.0–8.0)

## 2022-07-14 LAB — POC URINE PREG, ED: Preg Test, Ur: NEGATIVE

## 2022-07-14 MED ORDER — METRONIDAZOLE 500 MG PO TABS
500.0000 mg | ORAL_TABLET | Freq: Two times a day (BID) | ORAL | 0 refills | Status: AC
  Filled 2022-07-14: qty 14, 7d supply, fill #0

## 2022-07-14 NOTE — Discharge Instructions (Signed)
Pregnancy test was negative  The urinalysis just showed the blood from your menstrual cycle  Staff will notify you of any positives on your swab  Take metronidazole 500 mg--1 tablet 2 times daily for 7 days.  Avoid drinking alcohol within 72 hours of taking this medication

## 2022-07-14 NOTE — ED Provider Notes (Signed)
[facility]    CSN: [id number] Arrival date & time: 07/14/22  1313      History   Chief Complaint Chief Complaint  Patient presents with   Abdominal Cramping    HPI Correne Lalani is a 31 y.o. female.    Abdominal Cramping   Here for left lower quadrant pain that is been radiating toward her back.  It began about a week ago.  She also has had vaginal irritation and odor.  She states this is much like when she has had bacterial vaginosis No fever or chills no dysuria or urinary frequency.  Last menstrual cycle started today. 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   MDD (major depressive disorder), severe (HCC) 01/03/2019   Essential hypertension 11/28/2018   Anxiety and depression 11/28/2018   IUD contraception 08/09/2016   Acute systolic CHF (congestive heart failure) (HCC) 10/27/2014   Peripartum cardiomyopathy, 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 Authorizing Provider  metroNIDAZOLE (FLAGYL) 500 MG  tablet Take 1 tablet (500 mg total) by mouth 2 (two) times daily for 7 days. 07/14/22 07/21/22 Yes Ikaika Showers, Janace Aris, MD  ergocalciferol (VITAMIN D2) 1.25 MG (50000 UT) capsule Take 1 capsule (50,000 Units total) by mouth once a week. 05/02/22   Grayce Sessions, NP  escitalopram (LEXAPRO) 10 MG tablet Take 1 tablet (10 mg total) by mouth every morning. 06/16/22   Marcellina Millin, MD  escitalopram (LEXAPRO) 20 MG tablet Take 1 tablet (20 mg total) by mouth daily. 04/27/22   Grayce Sessions, NP    Family History Family History  Problem Relation Age of Onset   Arthritis Mother    Depression Mother    Hyperlipidemia Mother    Learning disabilities Mother    Mental illness Mother    Varicose Veins Mother    Alcohol abuse Father    Arthritis Father    Asthma Father    Cancer Father    Drug abuse Father    Diabetes Father    Early death Father    Heart disease Father    Learning disabilities Sister    Hyperlipidemia Maternal Grandmother  Hyperlipidemia Maternal Grandfather     Social History Social History   Tobacco Use   Smoking status: Every Day    Packs/day: 1.00    Years: 10.00    Total pack years: 10.00    Types: Cigarettes   Smokeless tobacco: Never  Vaping Use   Vaping Use: Never used  Substance Use Topics   Alcohol use: Yes    Alcohol/week: 0.0 standard drinks of alcohol    Comment: Rare   Drug use: Yes    Types: Heroin     Allergies   Lisinopril   Review of Systems Review of Systems   Physical Exam Triage Vital Signs ED Triage Vitals [07/14/22 1437]  Enc Vitals Group     BP 128/71     Pulse Rate 82     Resp 18     Temp 97.8 F (36.6 C)     Temp Source Oral     SpO2 99 %     Weight      Height      Head Circumference      Peak Flow      Pain Score      Pain Loc      Pain Edu?      Excl. in GC?    No data found.  Updated Vital Signs BP 128/71 (BP Location: Left Arm)   Pulse 82   Temp 97.8 F (36.6 C) (Oral)   Resp 18    LMP 07/14/2022   SpO2 99%   Visual Acuity Right Eye Distance:   Left Eye Distance:   Bilateral Distance:    Right Eye Near:   Left Eye Near:    Bilateral Near:     Physical Exam Vitals reviewed.  Constitutional:      General: She is not in acute distress.    Appearance: She is not ill-appearing, toxic-appearing or diaphoretic.  HENT:     Mouth/Throat:     Mouth: Mucous membranes are moist.  Eyes:     Extraocular Movements: Extraocular movements intact.     Conjunctiva/sclera: Conjunctivae normal.     Pupils: Pupils are equal, round, and reactive to light.  Cardiovascular:     Rate and Rhythm: Normal rate and regular rhythm.     Heart sounds: No murmur heard. Pulmonary:     Effort: Pulmonary effort is normal.     Breath sounds: Normal breath sounds.  Abdominal:     General: There is no distension.     Palpations: Abdomen is soft. There is no mass.     Tenderness: There is no abdominal tenderness.  Musculoskeletal:     Cervical back: Neck supple.  Lymphadenopathy:     Cervical: No cervical adenopathy.  Skin:    Coloration: Skin is not jaundiced or pale.  Neurological:     Mental Status: She is alert and oriented to person, place, and time.  Psychiatric:        Behavior: Behavior normal.      UC Treatments / Results  Labs (all labs ordered are listed, but only abnormal results are displayed) Labs Reviewed  POCT URINALYSIS DIPSTICK, ED / UC - Abnormal; Notable for the following components:      Result Value   Hgb urine dipstick LARGE (*)    Leukocytes,Ua TRACE (*)    All other components within normal limits  POC URINE PREG, ED  CERVICOVAGINAL ANCILLARY ONLY    EKG   Radiology No results found.  Procedures Procedures (including critical care  time)  Medications Ordered in UC Medications - No data to display  Initial Impression / Assessment and Plan / UC Course  I have reviewed the triage vital signs and the nursing notes.  Pertinent labs &  imaging results that were available during my care of the patient were reviewed by me and considered in my medical decision making (see chart for details).        UPT is negative.  Urinalysis shows a large amount of blood, consistent with her being on her menses  Metronidazole effect empirically.  Staff will notify her of any positive Final Clinical Impressions(s) / UC Diagnoses   Final diagnoses:  Pelvic pain in female  Acute vaginitis     Discharge Instructions      Pregnancy test was negative  The urinalysis just showed the blood from your menstrual cycle  Staff will notify you of any positives on your swab  Take metronidazole 500 mg--1 tablet 2 times daily for 7 days.  Avoid drinking alcohol within 72 hours of taking this medication       ED Prescriptions     Medication Sig Dispense Auth. Provider   metroNIDAZOLE (FLAGYL) 500 MG tablet Take 1 tablet (500 mg total) by mouth 2 (two) times daily for 7 days. 14 tablet Jessiah Wojnar, Janace Aris, MD      PDMP not reviewed this encounter.   Zenia Resides, MD 07/14/22 1540

## 2022-07-14 NOTE — ED Triage Notes (Signed)
Pt reports abdominal pain and cramping x 2-3 days. Pt reports being on her cycle.

## 2022-07-15 LAB — CERVICOVAGINAL ANCILLARY ONLY
Bacterial Vaginitis (gardnerella): POSITIVE — AB
Candida Glabrata: NEGATIVE
Candida Vaginitis: NEGATIVE
Chlamydia: NEGATIVE
Comment: NEGATIVE
Comment: NEGATIVE
Comment: NEGATIVE
Comment: NEGATIVE
Comment: NEGATIVE
Comment: NORMAL
Neisseria Gonorrhea: NEGATIVE
Trichomonas: NEGATIVE

## 2022-07-18 ENCOUNTER — Ambulatory Visit: Payer: Commercial Managed Care - HMO | Attending: Cardiovascular Disease | Admitting: Cardiovascular Disease

## 2022-08-05 ENCOUNTER — Ambulatory Visit (HOSPITAL_COMMUNITY)
Admission: EM | Admit: 2022-08-05 | Discharge: 2022-08-05 | Disposition: A | Discharge status: Home / Self Care | Payer: Commercial Managed Care - HMO | Attending: Psychiatry | Admitting: Psychiatry

## 2022-08-05 DIAGNOSIS — Z79899 Other long term (current) drug therapy: Secondary | ICD-10-CM | POA: Diagnosis not present

## 2022-08-05 DIAGNOSIS — Z8659 Personal history of other mental and behavioral disorders: Secondary | ICD-10-CM | POA: Diagnosis not present

## 2022-08-05 DIAGNOSIS — F1120 Opioid dependence, uncomplicated: Secondary | ICD-10-CM | POA: Diagnosis present

## 2022-08-05 DIAGNOSIS — Z91148 Patient's other noncompliance with medication regimen for other reason: Secondary | ICD-10-CM | POA: Insufficient documentation

## 2022-08-05 DIAGNOSIS — F1910 Other psychoactive substance abuse, uncomplicated: Secondary | ICD-10-CM

## 2022-08-05 DIAGNOSIS — Z9151 Personal history of suicidal behavior: Secondary | ICD-10-CM | POA: Diagnosis not present
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
## 2022-08-05 NOTE — Progress Notes (Signed)
   08/05/22 1129  [place] (Walk-ins at [facility] only)  How Did You Hear About [place]? Self  What Is the Reason for Your Visit/Call Today? Lauren Sharp is a 32 yo female presenting to [facility] voluntarily with cheif complaint of addictions to opioids.  Patient report she went to [facility] today for substance abuse treatment and after taking a urin test the counselor told her to come to [facility]  to see if we have additional resources for her. Patient reports snorting a gram of fentanyl daily for the past 7 years. Patient also reports using THC and cocaine. Patient last use of fentanyl was about an hour ago. Patient also reports diagnosis of depression and anxiety. Patient has a history of going to a methadone clinic about two months ago but stopped going due to her continuous use of "street drugs". Patient also went to [facility] a few years ago.  Pt denies current SI, HI, AVH and SIB but reports passive SI when she feels really depressed.  How Long Has This Been Causing You Problems? > than 6 months  Have You Recently Had Any Thoughts About Hurting Yourself? Yes  How long ago did you have thoughts about hurting yourself? "as long as I can remember" "it cross my mind when I feel really depressed"  Are You Planning to [place] At This time? No  Have you Recently Had Thoughts About [place]? No  Are You Planning To Harm Someone At This Time? No  Are you currently experiencing any auditory, visual or other hallucinations? No  Have You Used Any Alcohol or Drugs in the Past 24 Hours? Yes  How long ago did you use Drugs or Alcohol? hour ago  What Did You Use and How Much? gram  Do you have any current medical co-morbidities that require immediate attention? No  Clinician description of patient physical appearance/behavior: calm  What Do You Feel Would Help You the Most Today? Alcohol or Drug Use Treatment  If access to [facility] was not available, would you have sought care in the  Emergency Department? No  Determination of Need Routine (7 days)  Options For Referral Medication Management;Outpatient Therapy;Facility-Based Crisis

## 2022-08-05 NOTE — Discharge Instructions (Addendum)
Substance Abuse Resources  [facility] - Admissions are currently completed Monday through Friday at 8am; both appointments and walk-ins are accepted.  Any individual that is a [place] resident may present for a substance abuse screening and assessment for admission.  A person may be referred by numerous sources or self-refer.   Potential clients will be screened for medical necessity and appropriateness for the program.  Clients must meet criteria for high-intensity residential treatment services.  If clinically appropriate, a client will continue with the comprehensive clinical assessment and intake process, as well as enrollment in the [facility].   Address: [address] [place], [place] [address] Admin Hours: Mon-Fri 8AM to 5PM Center Hours: 24/7 Phone: [phone] Fax: [phone]   [facility] (Detox) Facility Based Crisis:  These are 3 locations for services: Please call before arrival    Address: [address] [place], [place] [address] Phone: [phone]   Address: [address], [place], [place] [address] Phone#: [phone]   Address: [address], [place], [place] [address] Phone#: [phone]     [facility]): (offers outpatient therapy and intensive outpatient substance abuse therapy).  [address], [place], [place] [address] Phone: [phone]   [facility]: Offers FREE recovery skills classes, support groups, 1:1 Peer Support, and Compeer Classes. [address], [place], [place] [address] Phone: [phone] (Call to complete intake).  [facility] [address] [place], [place] [address] Phone: [phone] ext: [phone] [facility] provides food, shelter and other programs and services to the homeless men of [place] through our men's program.   By offering safe shelter, three meals a day,  clean clothing, Biblical counseling, financial planning, vocational training, GED/education and employment assistance, we've helped mend the shattered lives of many homeless men since opening in 1974.   We have approximately 267 beds available, with a max of 312 beds including mats for emergency situations and currently house an average of 270 men a night.   Prospective Client Check-In Information Photo ID Required (State/ Out of State/ DOC) - if photo ID is not available, clients are required to have a printout of a police/sheriff's criminal history report. Help out with chores around the Mission. No sex offender of any type (pending, charged, registered and/or any other sex related offenses) will be permitted to check in. Must be willing to abide by all rules, regulations, and policies established by the [facility]. The following will be provided - shelter, food, clothing, and biblical counseling. If you or someone you know is in need of assistance at our men's shelter in [place], [place], please call [phone] ext. [phone].   [facility]-will provide timely access to mental health services for children and adolescents (4-17) and adults presenting in a mental health crisis. The program is designed for those who need urgent Behavioral Health or Substance Use treatment and are not experiencing a medical crisis that would typically require an emergency room visit.    [address] [place],  [address] Phone: [phone] [email or web address]   [facility]: Phone#: [phone]   The [facility] SA Intensive Outpatient Program (SAIOP) means structured individual and group addiction activities and services that are provided at an outpatient program designed to assist adult and adolescent consumers to begin recovery and learn skills for recovery maintenance. The [facility] SAIOP program is offered at   least 3 hours a  day, 3 days a week.SAIOP services shall include a structured program consisting of, but not limited to, the following services: Individual counseling and support; Group counseling and support; Family counseling, training or support; Biochemical assays to identify recent drug use (e.g., urine drug screens); Strategies for relapse prevention to include community and social support systems in treatment; Life skills; Crisis contingency planning; Disease Management; and Treatment support activities that have been adapted or specifically designed for persons with physical disabilities, or persons with co-occurring disorders of mental illness and substance abuse/dependence or mental retardation/developmental disability and substance abuse/dependence. Phone: [phone]    [facility] [address] [place], [place] [address] Phone: [phone] Admissions team is available 24/7  Phone: [phone]. Fax: [phone]   [facility]     Admissions [address], [place],  [address] [phone]    [facility] 24-Hour Call Center: [phone]  Behavioral Health Crisis Line: [phone]     

## 2022-08-10 ENCOUNTER — Ambulatory Visit (INDEPENDENT_AMBULATORY_CARE_PROVIDER_SITE_OTHER): Payer: Commercial Managed Care - HMO | Admitting: Primary Care

## 2023-01-04 ENCOUNTER — Other Ambulatory Visit: Payer: Self-pay

## 2023-01-04 MED ORDER — HYDROCORTISONE 2.5 % EX CREA
TOPICAL_CREAM | Freq: Every day | CUTANEOUS | 0 refills | Status: AC
  Filled 2023-01-04: qty 30, 15d supply, fill #0

## 2023-01-04 MED ORDER — SERTRALINE HCL 50 MG PO TABS
50.0000 mg | ORAL_TABLET | Freq: Every day | ORAL | 1 refills | Status: AC
  Filled 2023-01-04: qty 30, 30d supply, fill #0

## 2023-04-17 ENCOUNTER — Other Ambulatory Visit: Payer: Self-pay

## 2023-04-17 MED ORDER — BUPROPION HCL ER (XL) 300 MG PO TB24
300.0000 mg | ORAL_TABLET | Freq: Every morning | ORAL | 1 refills | Status: AC
  Filled 2023-04-17: qty 30, 30d supply, fill #0

## 2023-04-19 ENCOUNTER — Other Ambulatory Visit: Payer: Self-pay

## 2023-09-10 ENCOUNTER — Encounter (HOSPITAL_COMMUNITY): Payer: Self-pay

## 2023-09-10 ENCOUNTER — Emergency Department (HOSPITAL_COMMUNITY)
Admission: EM | Admit: 2023-09-10 | Discharge: 2023-09-10 | Discharge status: Against Medical Advice | Payer: Commercial Managed Care - HMO | Attending: Emergency Medicine | Admitting: Emergency Medicine

## 2023-09-10 ENCOUNTER — Emergency Department (HOSPITAL_COMMUNITY): Payer: Commercial Managed Care - HMO

## 2023-09-10 ENCOUNTER — Other Ambulatory Visit: Payer: Self-pay

## 2023-09-10 DIAGNOSIS — Z20822 Contact with and (suspected) exposure to covid-19: Secondary | ICD-10-CM | POA: Insufficient documentation

## 2023-09-10 DIAGNOSIS — Z5329 Procedure and treatment not carried out because of patient's decision for other reasons: Secondary | ICD-10-CM | POA: Insufficient documentation

## 2023-09-10 DIAGNOSIS — R059 Cough, unspecified: Secondary | ICD-10-CM | POA: Insufficient documentation

## 2023-09-10 DIAGNOSIS — R058 Other specified cough: Secondary | ICD-10-CM

## 2023-09-10 DIAGNOSIS — E860 Dehydration: Secondary | ICD-10-CM

## 2023-09-10 DIAGNOSIS — R072 Precordial pain: Secondary | ICD-10-CM

## 2023-09-10 DIAGNOSIS — Z87898 Personal history of other specified conditions: Secondary | ICD-10-CM | POA: Diagnosis not present

## 2023-09-10 DIAGNOSIS — R112 Nausea with vomiting, unspecified: Secondary | ICD-10-CM

## 2023-09-10 LAB — COMPREHENSIVE METABOLIC PANEL
ALT: 10 U/L (ref 0–44)
AST: 16 U/L (ref 15–41)
Albumin: 4.3 g/dL (ref 3.5–5.0)
Alkaline Phosphatase: 68 U/L (ref 38–126)
Anion gap: 13 (ref 5–15)
BUN: 15 mg/dL (ref 6–20)
CO2: 22 mmol/L (ref 22–32)
Calcium: 11.2 mg/dL — ABNORMAL HIGH (ref 8.9–10.3)
Chloride: 103 mmol/L (ref 98–111)
Creatinine, Ser: 0.88 mg/dL (ref 0.44–1.00)
GFR, Estimated: >60 mL/min (ref >60)
Glucose, Bld: 104 mg/dL — ABNORMAL HIGH (ref 70–99)
Potassium: 3.7 mmol/L (ref 3.5–5.1)
Sodium: 138 mmol/L (ref 135–145)
Total Bilirubin: 0.8 mg/dL (ref 0.0–1.2)
Total Protein: 7.4 g/dL (ref 6.5–8.1)

## 2023-09-10 LAB — RESP PANEL BY RT-PCR (RSV, FLU A&B, COVID)  RVPGX2
Influenza A by PCR: NEGATIVE
Influenza B by PCR: NEGATIVE
Resp Syncytial Virus by PCR: NEGATIVE
SARS Coronavirus 2 by RT PCR: NEGATIVE

## 2023-09-10 LAB — CBC
HCT: 41.4 % (ref 36.0–46.0)
Hemoglobin: 14 g/dL (ref 12.0–15.0)
MCH: 26.9 pg (ref 26.0–34.0)
MCHC: 33.8 g/dL (ref 30.0–36.0)
MCV: 79.5 fL — ABNORMAL LOW (ref 80.0–100.0)
Platelets: 369 10*3/uL (ref 150–400)
RBC: 5.21 MIL/uL — ABNORMAL HIGH (ref 3.87–5.11)
RDW: 13.6 % (ref 11.5–15.5)
WBC: 11 10*3/uL — ABNORMAL HIGH (ref 4.0–10.5)
nRBC: 0 % (ref 0.0–0.2)

## 2023-09-10 LAB — TROPONIN I (HIGH SENSITIVITY): Troponin I (High Sensitivity): 3 ng/L (ref <18)

## 2023-09-10 MED ORDER — ALUM & MAG HYDROXIDE-SIMETH 200-200-20 MG/5ML PO SUSP
30.0000 mL | Freq: Once | ORAL | Status: AC
  Administered 2023-09-10: 30 mL via ORAL
  Filled 2023-09-10: qty 30

## 2023-09-10 MED ORDER — FAMOTIDINE 20 MG PO TABS
20.0000 mg | ORAL_TABLET | Freq: Once | ORAL | Status: AC
  Administered 2023-09-10: 20 mg via ORAL
  Filled 2023-09-10: qty 1

## 2023-09-10 MED ORDER — ONDANSETRON HCL 4 MG/2ML IJ SOLN
4.0000 mg | Freq: Once | INTRAMUSCULAR | Status: AC
  Administered 2023-09-10: 4 mg via INTRAVENOUS
  Filled 2023-09-10: qty 2

## 2023-09-10 MED ORDER — LACTATED RINGERS IV BOLUS
1000.0000 mL | Freq: Once | INTRAVENOUS | Status: AC
  Administered 2023-09-10: 1000 mL via INTRAVENOUS

## 2023-09-10 MED ORDER — ACETAMINOPHEN 500 MG PO TABS
1000.0000 mg | ORAL_TABLET | Freq: Once | ORAL | Status: AC
  Administered 2023-09-10: 1000 mg via ORAL
  Filled 2023-09-10: qty 2

## 2023-09-10 MED ORDER — LACTATED RINGERS IV BOLUS: 1000.0000 mL | Freq: Once | INTRAVENOUS | Status: DC

## 2023-09-10 NOTE — ED Triage Notes (Signed)
 Pt c/o abd pain, n/v for 4 days. Ax[facility]4. [facility]. Denies being pregnant.

## 2023-09-10 NOTE — ED Provider Notes (Addendum)
 [place] [facility] AT [facility] Provider Note   CSN: [id number] Arrival date & time: 09/10/23  1050     History  Chief Complaint  Patient presents with   flu-like symptoms    Donisha Hoch is a 33 y.o. female.  Pt with nausea/vomiting, nasal congestion/rhinorrhea, and non prod cough in past couple days. Emesis clear, not bloody or bilious. No abd pain or distension. Having regular bms. No dysuria or gu c/o. No vaginal discharge or bleeding. Cough  non productive. No sore throat. No trouble breathing, or swallowing. No specific known ill contacts or bad food ingestion. No known covid or flu exposure. No fever or chills. No headache. No neck pain. No extremity pain or swelling.   The history is provided by medical records and the patient.       Home Medications Prior to Admission medications   Medication Sig Start Date End Date Taking? Authorizing Provider  buPROPion  (WELLBUTRIN  XL) 300 MG 24 hr tablet Take 1 tablet (300 mg total) by mouth every morning. 04/12/23   Reddy, Keshavpal Gunna, MD  ergocalciferol  (VITAMIN D2) 1.25 MG (50000 UT) capsule Take 1 capsule (50,000 Units total) by mouth once a week. 05/02/22   Celestia Rosaline SQUIBB, NP  escitalopram  (LEXAPRO ) 10 MG tablet Take 1 tablet (10 mg total) by mouth every morning. 06/16/22   Reddy, Keshavpal Gunna, MD  escitalopram  (LEXAPRO ) 20 MG tablet Take 1 tablet (20 mg total) by mouth daily. 04/27/22   Celestia Rosaline SQUIBB, NP  hydrocortisone  2.5 % cream Apply topically daily as needed  for rash. 01/03/23   Reddy, Keshavpal Gunna, MD  sertraline  (ZOLOFT ) 50 MG tablet Take 1 tablet (50 mg total) by mouth daily. 01/04/23   Jess Roby Masker, MD      Allergies    Lisinopril     Review of Systems   Review of Systems  Constitutional:  Negative for chills and fever.  HENT:  Positive for congestion and rhinorrhea. Negative for sore throat.   Eyes:  Negative for pain and discharge.  Respiratory:  Positive for  cough. Negative for shortness of breath.   Cardiovascular:  Negative for chest pain and leg swelling.  Gastrointestinal:  Positive for nausea and vomiting. Negative for abdominal pain and diarrhea.  Genitourinary:  Negative for dysuria, flank pain, vaginal bleeding and vaginal discharge.  Musculoskeletal:  Negative for neck pain and neck stiffness.  Skin:  Negative for rash.  Neurological:  Negative for headaches.    Physical Exam Updated Vital Signs Temp 98.9 F (37.2 C) (Temporal)   Ht 1.626 m (5' 4)   Wt 77.1 kg   LMP 08/22/2023   BMI 29.18 kg/m  Physical Exam Vitals and nursing note reviewed.  Constitutional:      Appearance: Normal appearance. She is well-developed.  HENT:     Head: Atraumatic.     Nose: Nose normal.     Mouth/Throat:     Mouth: Mucous membranes are moist.     Pharynx: Oropharynx is clear. No oropharyngeal exudate or posterior oropharyngeal erythema.  Eyes:     General: No scleral icterus.    Conjunctiva/sclera: Conjunctivae normal.     Pupils: Pupils are equal, round, and reactive to light.  Neck:     Trachea: No tracheal deviation.     Comments: No stiffness or rigidity.  Cardiovascular:     Rate and Rhythm: Normal rate and regular rhythm.     Pulses: Normal pulses.     Heart sounds: Normal heart  sounds. No murmur heard.    No friction rub. No gallop.  Pulmonary:     Effort: Pulmonary effort is normal. No respiratory distress.     Breath sounds: Normal breath sounds.  Abdominal:     General: Bowel sounds are normal. There is no distension.     Palpations: Abdomen is soft.     Tenderness: There is no abdominal tenderness. There is no guarding.  Genitourinary:    Comments: No cva tenderness.  Musculoskeletal:        General: No swelling or tenderness.     Cervical back: Normal range of motion and neck supple. No rigidity. No muscular tenderness.     Comments: C spine non tender, aligned. Good rom bil extremities without pain or focal bony  tenderness. No sts noted. No cellulitis noted.   Lymphadenopathy:     Cervical: No cervical adenopathy.  Skin:    General: Skin is warm and dry.     Findings: No rash.  Neurological:     Mental Status: She is alert.     Comments: Alert, speech normal. Motor/sens grossly intact bil.   Psychiatric:        Mood and Affect: Mood normal.     ED Results / Procedures / Treatments   Labs (all labs ordered are listed, but only abnormal results are displayed) Results for orders placed or performed during the hospital encounter of 09/10/23  Comprehensive metabolic panel   Collection Time: 09/10/23 11:45 AM  Result Value Ref Range   Sodium 138 135 - 145 mmol/L   Potassium 3.7 3.5 - 5.1 mmol/L   Chloride 103 98 - 111 mmol/L   CO2 22 22 - 32 mmol/L   Glucose, Bld 104 (H) 70 - 99 mg/dL   BUN 15 6 - 20 mg/dL   Creatinine, Ser 9.11 0.44 - 1.00 mg/dL   Calcium  11.2 (H) 8.9 - 10.3 mg/dL   Total Protein 7.4 6.5 - 8.1 g/dL   Albumin 4.3 3.5 - 5.0 g/dL   AST 16 15 - 41 U/L   ALT 10 0 - 44 U/L   Alkaline Phosphatase 68 38 - 126 U/L   Total Bilirubin 0.8 0.0 - 1.2 mg/dL   GFR, Estimated >39 >39 mL/min   Anion gap 13 5 - 15  CBC   Collection Time: 09/10/23 11:45 AM  Result Value Ref Range   WBC 11.0 (H) 4.0 - 10.5 K/uL   RBC 5.21 (H) 3.87 - 5.11 MIL/uL   Hemoglobin 14.0 12.0 - 15.0 g/dL   HCT 58.5 63.9 - 53.9 %   MCV 79.5 (L) 80.0 - 100.0 fL   MCH 26.9 26.0 - 34.0 pg   MCHC 33.8 30.0 - 36.0 g/dL   RDW 86.3 88.4 - 84.4 %   Platelets 369 150 - 400 K/uL   nRBC 0.0 0.0 - 0.2 %  Troponin I (High Sensitivity)   Collection Time: 09/10/23 11:45 AM  Result Value Ref Range   Troponin I (High Sensitivity) 3 <18 ng/L     EKG EKG Interpretation Date/Time:  Sunday September 10 2023 13:58:33 EST Ventricular Rate:  69 PR Interval:  119 QRS Duration:  90 QT Interval:  399 QTC Calculation: 428 R Axis:   53  Text Interpretation: Sinus arrhythmia Nonspecific T wave abnormality No significant  change since last tracing Confirmed by Bernard Drivers ([id number]) on 09/10/2023 2:07:32 PM  Radiology DG Chest Port 1 View Result Date: 09/10/2023 CLINICAL DATA:  Vomiting for 4 days.  History of  CHF EXAM: PORTABLE CHEST 1 VIEW COMPARISON:  04/16/2017 FINDINGS: Stable cardiomediastinal silhouette. No focal consolidation, pleural effusion, or pneumothorax. No displaced rib fractures. IMPRESSION: No active disease. Electronically Signed   By: Norman Gatlin M.D.   On: 09/10/2023 12:16    Procedures Procedures    Medications Ordered in ED Medications  lactated ringers  bolus 1,000 mL (has no administration in time range)  lactated ringers  bolus 1,000 mL (1,000 mLs Intravenous New Bag/Given 09/10/23 1221)  ondansetron  (ZOFRAN ) injection 4 mg (4 mg Intravenous Given 09/10/23 1221)  acetaminophen  (TYLENOL ) tablet 1,000 mg (1,000 mg Oral Given 09/10/23 1221)  alum & mag hydroxide-simeth (MAALOX/MYLANTA) 200-200-20 MG/5ML suspension 30 mL (30 mLs Oral Given 09/10/23 1222)  famotidine  (PEPCID ) tablet 20 mg (20 mg Oral Given 09/10/23 1221)    ED Course/ Medical Decision Making/ A&P                                 Medical Decision Making Problems Addressed: Dehydration: acute illness or injury with systemic symptoms that poses a threat to life or bodily functions History of substance use disorder: chronic illness or injury with exacerbation, progression, or side effects of treatment that poses a threat to life or bodily functions Nausea and vomiting in adult: acute illness or injury with systemic symptoms that poses a threat to life or bodily functions Non-productive cough: acute illness or injury Precordial chest pain: acute illness or injury with systemic symptoms that poses a threat to life or bodily functions  Amount and/or Complexity of Data Reviewed External Data Reviewed: notes. Labs: ordered. Decision-making details documented in ED Course. Radiology: ordered and independent interpretation performed.  Decision-making details documented in ED Course. ECG/medicine tests: ordered and independent interpretation performed. Decision-making details documented in ED Course.  Risk OTC drugs. Prescription drug management. Decision regarding hospitalization.   Iv ns. Continuous pulse ox and cardiac monitoring. Labs ordered/sent. Imaging ordered.   Differential diagnosis includes flu, gastroenteritis, dehydration, aki, etc. Dispo decision including potential need for admission considered - will get labs and imaging and reassess.   Reviewed nursing notes and prior charts for additional history. External reports reviewed.   Cardiac monitor: sinus rhythm, rate 90.  LR bolus. Acetaminophen  po, pepcid  po, maalox po. Zofran  iv.   Labs reviewed/interpreted by me - wbc 11, hgb 14, chem largely unremarkable.   Additional LR bolus.   Xrays reviewed/interpreted by me - no pna.   Of note, signif other/friend, same address, also w hx sud in ED with similar symptoms, ?possible chs, ?possible opiate withdrawal symptoms, ?possible gastroenteritis. Additional iv and po fluids.   Went to reassess pt - pt had left ED AMA prior to completion evaluation without notifying EDP team.            Final Clinical Impression(s) / ED Diagnoses Final diagnoses:  None    Rx / DC Orders ED Discharge Orders     None            Bernard Drivers, MD 09/10/23 1447

## 2023-09-10 NOTE — ED Notes (Signed)
Pt left AMA, MD notified.

## 2023-09-10 NOTE — Discharge Instructions (Addendum)
 It was our pleasure to provide your ER care today - we hope that you feel better.  Drink plenty of fluids/stay well hydrated. Take zofran  as need for nausea. Take acetaminophen  as need.   Avoid drug use as it is harmful to your physical health and mental well-being. See resource guide attached in terms of accessing inpatient or outpatient substance use treatment programs.   Follow up closely with primary care doctor and behavioral health provider in the coming week.  For mental health issues and/or crisis, you may also go directly to the [facility] - they are open 24/7 and walk-ins are welcome.  Your blood pressure is high today - take your meds as prescribed, limit salt intake, and follow up closely with primary care doctor in 1-2 weeks.   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   Return to ER if worse, new symptoms, fevers, new or severe pain, persistent vomiting, recurrent/persistent chest pain, increased trouble breathing, severe abdominal pain, or other concern.

## 2024-09-17 ENCOUNTER — Encounter: Payer: MEDICAID | Admitting: Obstetrics and Gynecology
